# Patient Record
Sex: Female | Born: 1990 | Race: Black or African American | Hispanic: No | Marital: Married | Smoking: Never smoker
Health system: Southern US, Community
[De-identification: ages and names within clinical notes are randomized; demographics above are authoritative.]

## PROBLEM LIST (undated history)

## (undated) ENCOUNTER — Inpatient Hospital Stay (HOSPITAL_COMMUNITY): Payer: Self-pay

## (undated) DIAGNOSIS — T7840XA Allergy, unspecified, initial encounter: Secondary | ICD-10-CM

## (undated) DIAGNOSIS — A749 Chlamydial infection, unspecified: Secondary | ICD-10-CM

## (undated) DIAGNOSIS — N390 Urinary tract infection, site not specified: Secondary | ICD-10-CM

## (undated) DIAGNOSIS — Z8619 Personal history of other infectious and parasitic diseases: Secondary | ICD-10-CM

## (undated) DIAGNOSIS — R87629 Unspecified abnormal cytological findings in specimens from vagina: Secondary | ICD-10-CM

## (undated) DIAGNOSIS — O139 Gestational [pregnancy-induced] hypertension without significant proteinuria, unspecified trimester: Secondary | ICD-10-CM

## (undated) DIAGNOSIS — K219 Gastro-esophageal reflux disease without esophagitis: Secondary | ICD-10-CM

## (undated) DIAGNOSIS — J302 Other seasonal allergic rhinitis: Secondary | ICD-10-CM

## (undated) HISTORY — DX: Unspecified abnormal cytological findings in specimens from vagina: R87.629

## (undated) HISTORY — DX: Gestational (pregnancy-induced) hypertension without significant proteinuria, unspecified trimester: O13.9

## (undated) HISTORY — DX: Allergy, unspecified, initial encounter: T78.40XA

## (undated) HISTORY — DX: Personal history of other infectious and parasitic diseases: Z86.19

## (undated) HISTORY — DX: Gastro-esophageal reflux disease without esophagitis: K21.9

## (undated) HISTORY — PX: LEEP: SHX91

---

## 2011-12-25 ENCOUNTER — Emergency Department (HOSPITAL_BASED_OUTPATIENT_CLINIC_OR_DEPARTMENT_OTHER)
Admission: EM | Admit: 2011-12-25 | Discharge: 2011-12-25 | Disposition: A | Discharge status: Home / Self Care | Payer: Self-pay | Attending: Emergency Medicine | Admitting: Emergency Medicine

## 2011-12-25 ENCOUNTER — Encounter (HOSPITAL_BASED_OUTPATIENT_CLINIC_OR_DEPARTMENT_OTHER): Payer: Self-pay | Admitting: Emergency Medicine

## 2011-12-25 DIAGNOSIS — H5789 Other specified disorders of eye and adnexa: Secondary | ICD-10-CM | POA: Insufficient documentation

## 2011-12-25 DIAGNOSIS — S0500XA Injury of conjunctiva and corneal abrasion without foreign body, unspecified eye, initial encounter: Secondary | ICD-10-CM

## 2011-12-25 DIAGNOSIS — H5710 Ocular pain, unspecified eye: Secondary | ICD-10-CM | POA: Insufficient documentation

## 2011-12-25 DIAGNOSIS — R51 Headache: Secondary | ICD-10-CM | POA: Insufficient documentation

## 2011-12-25 DIAGNOSIS — H579 Unspecified disorder of eye and adnexa: Secondary | ICD-10-CM | POA: Insufficient documentation

## 2011-12-25 DIAGNOSIS — H53149 Visual discomfort, unspecified: Secondary | ICD-10-CM | POA: Insufficient documentation

## 2011-12-25 DIAGNOSIS — H02849 Edema of unspecified eye, unspecified eyelid: Secondary | ICD-10-CM | POA: Insufficient documentation

## 2011-12-25 DIAGNOSIS — H209 Unspecified iridocyclitis: Secondary | ICD-10-CM

## 2011-12-25 DIAGNOSIS — X58XXXA Exposure to other specified factors, initial encounter: Secondary | ICD-10-CM | POA: Insufficient documentation

## 2011-12-25 HISTORY — DX: Other seasonal allergic rhinitis: J30.2

## 2011-12-25 MED ORDER — CIPROFLOXACIN HCL 0.3 % OP SOLN
1.0000 [drp] | OPHTHALMIC | Status: DC
  Administered 2011-12-25: 1 [drp] via OPHTHALMIC

## 2011-12-25 MED ORDER — TETRACAINE HCL 0.5 % OP SOLN
1.0000 [drp] | Freq: Once | OPHTHALMIC | Status: DC
  Filled 2011-12-25 (×2): qty 2

## 2011-12-25 MED ORDER — CIPROFLOXACIN HCL 0.3 % OP SOLN
OPHTHALMIC | Status: AC
  Administered 2011-12-25: 1 [drp] via OPHTHALMIC
  Filled 2011-12-25: qty 2.5

## 2011-12-25 MED ORDER — HYDROCODONE-ACETAMINOPHEN 5-325 MG PO TABS
1.0000 | ORAL_TABLET | Freq: Once | ORAL | Status: AC
  Administered 2011-12-25: 1 via ORAL
  Filled 2011-12-25: qty 1

## 2011-12-25 MED ORDER — HYDROCODONE-ACETAMINOPHEN 5-325 MG PO TABS: ORAL_TABLET | ORAL | Status: AC

## 2011-12-25 MED ORDER — FLUORESCEIN SODIUM 1 MG OP STRP
1.0000 | ORAL_STRIP | Freq: Once | OPHTHALMIC | Status: DC
  Filled 2011-12-25 (×2): qty 1

## 2011-12-25 NOTE — ED Notes (Signed)
Pt c/o left eye redness, burning and drainage.  Sensitive to light.

## 2011-12-25 NOTE — Discharge Instructions (Signed)
Iritis Iritis is an inflammation of the colored part of the eye (iris). Other parts at the front of the eye may also be inflamed. The iris is part of the middle layer of the eyeball which is called the uvea or the uveal track. Any part of the uveal track can become inflamed. The other portions of the uveal track are the choroid (the thin membrane under the outer layer of the eye), and the ciliary body (joins the choroid and the iris and produces the fluid in the front of the eye).  It is extremely important to treat iritis early, as it may lead to internal eye damage causing scarring or diseases such as glaucoma. Some people have only one attack of iritis (in one or both eyes) in their lifetime, while others may get it many times. CAUSES Iritis can be associated with many different diseases, but mostly occurs in otherwise healthy people. Examples of diseases that can be associated with iritis include:  Diseases where the body's immune system attacks tissues within your own body (autoimmune diseases).   Infections (tuberculosis, gonorrhea, fungus infections, Lyme disease, infection of the lining of the heart).   Trauma or injury.   Eye diseases (acute glaucoma and others).   Inflammation from other parts of the uveal track.   Severe eye infections.   Other rare diseases.  SYMPTOMS  Eye pain or aching.   Sensitivity to light.   Loss of sight or blurred vision.   Redness of the eye. This is often accompanied by a ring of redness around the outside of the cornea, or clear covering at the front of the eye (ciliary flush).   Excessive tearing of the eye(s).   A small pupil that does not enlarge in the dark and stays smaller than the other eye's pupil.   A whitish area that obscures the lower part of the colored circular iris. Sometimes this is visible when looking at the eye, where the whitish area has a "fluid level" or flat top. This is called a "hypopyon" and is actually pus inside the  eye.  Since iritis causes the eye to become red, it is often confused with a much less dangerous form of "pink eye" or conjunctivitis. One of the most important symptoms is sensitivity to light. Anytime there is redness, discomfort in the eye(s) and extreme light sensitivity, it is extremely important to see an ophthalmologist as soon as possible. TREATMENT Acute iritis requires prompt medical evaluation by an eye specialist (ophthalmologist.) Treatment depends on the underlying cause but may include:  Corticosteroid eye drops and dilating eye drops. Follow your caregiver's exact instructions on taking and stopping corticosteroid medications (drops or pills).   Occasionally, the iritis will be so severe that it will not respond to commonly used medications. If this happens, it may be necessary to use steroid injections. The injections are given under the eye's outer surface. Sometimes oral medications are given. The decision on treatment used for iritis is usually made on an individual basis.  HOME CARE INSTRUCTIONS Your care giver will give specific instructions regarding the use of eye medications or other medications. Be certain to follow all instructions in both taking and stopping the medications. SEEK IMMEDIATE MEDICAL CARE IF:  You have redness of one or both eye.   You experience a great deal of light sensitivity.   You have pain or aching in either eye.  MAKE SURE YOU:   Understand these instructions.   Will watch your condition.  Will get help right away if you are not doing well or get worse.  Document Released: 08/08/2005 Document Revised: 07/28/2011 Document Reviewed: 01/26/2007 [facility] Patient Information 2012 [place], [place].    Narcotic and benzodiazepine use may cause drowsiness, slowed breathing or dependence.  Please use with caution and do not drive, operate machinery or watch young children alone while taking them.  Taking combinations of these medications or  drinking alcohol will potentiate these effects.

## 2011-12-25 NOTE — ED Provider Notes (Signed)
History  This chart was scribed for Gavin Pound. Oletta Lamas, MD by Bennett Scrape. This patient was seen in room MH04/MH04 and the patient's care was started at 4:46PM.  CSN: [id number]  Arrival date & time 12/25/11  1516   First MD Initiated Contact with Patient 12/25/11 1646      Chief Complaint  Patient presents with  . Eye Problem    The history is provided by the patient. No language interpreter was used.    Gail Perez is a 21 y.o. female who presents to the Emergency Department complaining of 12 hours of sudden onset, gradually worsening, constant left eye pain described as sharp and redness with associated photophobia that she noticed when she woke up this morning. The pain is worse with movement. She also c/o a HA that she states was not initially there with the onset of the eye pain but has gradually developed over the day due to the eye pain. Pt states that she wears contacts but denies sleeping in her contacts last night. She states that her vision is blurred but denies any change in her normal vision. She states that she has allergies in which she usually gets itchy, watery eyes. She states that for her allergies she takes Claritin and has drops that she uses for when she wears contacts. She denies injury or rubbing of the eye as the cause of the pain. She denies neck pain, fever, nausea and emesis as associated symptoms. She has no h/o chronic medical conditions. She denies smoking and alcohol use.   Past Medical History  Diagnosis Date  . Seasonal allergies     History reviewed. No pertinent past surgical history.  History reviewed. No pertinent family history.  History  Substance Use Topics  . Smoking status: Never Smoker   . Smokeless tobacco: Not on file  . Alcohol Use: No     Review of Systems  Constitutional: Negative for fever and chills.  HENT: Negative for neck pain.   Eyes: Positive for photophobia, pain and redness.  Gastrointestinal: Negative for nausea  and vomiting.  Neurological: Positive for headaches. Negative for seizures.  All other systems reviewed and are negative.    Allergies  Review of patient's allergies indicates no known allergies.  Home Medications   Current Outpatient Rx  Name Route Sig Dispense Refill  . LORATADINE 10 MG PO TABS Oral Take 10 mg by mouth daily.    Marland Kitchen HYDROCODONE-ACETAMINOPHEN 5-325 MG PO TABS  1-2 tablets po q 6 hours prn moderate to severe pain 15 tablet 0    Triage Vitals: BP 126/72  Pulse 60  Temp(Src) 98.5 F (36.9 C) (Oral)  Resp 20  SpO2 100%  LMP 12/02/2011  Physical Exam  Nursing note and vitals reviewed. Constitutional: She is oriented to person, place, and time. She appears well-developed and well-nourished.  HENT:  Head: Normocephalic and atraumatic.  Eyes: EOM are normal. Pupils are equal, round, and reactive to light.       Left eye conjunctivae is erythematous, left lower lid edema is noted, scratch on the left conjunctivae that does not involve the pupil is noted, right eye is normal  Neck: Normal range of motion. Neck supple.  Cardiovascular: Normal rate and regular rhythm.   Pulmonary/Chest: Effort normal and breath sounds normal.  Abdominal: Soft. There is no tenderness.  Musculoskeletal: Normal range of motion. She exhibits no edema.  Neurological: She is alert and oriented to person, place, and time.  Skin: Skin is warm and dry.  Psychiatric: She has a normal mood and affect. Her behavior is normal.    ED Course  Procedures (including critical care time)  DIAGNOSTIC STUDIES: Oxygen Saturation is 100% on room air, normal by my interpretation.    COORDINATION OF CARE: 4:55PM-Discussed need for UV lamp and numbing drops to further investigate symptoms and pt agreed. 5:00PM-Discussed antibiotic drops and pain medications as discharge plan with pt and pt agreed. Advised pt to follow up with optometrist as scheduled.    Labs Reviewed - No data to display No results  found.   1. Iritis   2. Conjunctival abrasion       MDM  I personally performed the services described in this documentation, which was scribed in my presence. The recorded information has been reviewed and considered.    Pt clinically with iritis with pain in left eye with lgith in right eye.  Flurousceine stain shows a small abrasion to left conjuntiva.  Likely cause.  Pt has appt with her own eye provider on Friday.  Cipro provided to her.    Gavin Pound. Oletta Lamas, MD 12/25/11 1610

## 2012-05-01 ENCOUNTER — Encounter (HOSPITAL_COMMUNITY): Payer: Self-pay | Admitting: *Deleted

## 2012-05-01 ENCOUNTER — Inpatient Hospital Stay (HOSPITAL_COMMUNITY): Payer: Medicaid Other

## 2012-05-01 ENCOUNTER — Inpatient Hospital Stay (HOSPITAL_COMMUNITY)
Admission: AD | Admit: 2012-05-01 | Discharge: 2012-05-01 | Disposition: A | Discharge status: Home / Self Care | Payer: Medicaid Other | Source: Ambulatory Visit | Attending: Obstetrics & Gynecology | Admitting: Obstetrics & Gynecology

## 2012-05-01 DIAGNOSIS — O2390 Unspecified genitourinary tract infection in pregnancy, unspecified trimester: Secondary | ICD-10-CM | POA: Insufficient documentation

## 2012-05-01 DIAGNOSIS — B9689 Other specified bacterial agents as the cause of diseases classified elsewhere: Secondary | ICD-10-CM | POA: Insufficient documentation

## 2012-05-01 DIAGNOSIS — R109 Unspecified abdominal pain: Secondary | ICD-10-CM | POA: Insufficient documentation

## 2012-05-01 DIAGNOSIS — A499 Bacterial infection, unspecified: Secondary | ICD-10-CM | POA: Insufficient documentation

## 2012-05-01 DIAGNOSIS — N390 Urinary tract infection, site not specified: Secondary | ICD-10-CM

## 2012-05-01 DIAGNOSIS — O2340 Unspecified infection of urinary tract in pregnancy, unspecified trimester: Secondary | ICD-10-CM

## 2012-05-01 DIAGNOSIS — N760 Acute vaginitis: Secondary | ICD-10-CM | POA: Insufficient documentation

## 2012-05-01 LAB — URINALYSIS, ROUTINE W REFLEX MICROSCOPIC
Bilirubin Urine: NEGATIVE
Glucose, UA: NEGATIVE mg/dL
Hgb urine dipstick: NEGATIVE
Protein, ur: NEGATIVE mg/dL
Urobilinogen, UA: 0.2 mg/dL (ref 0.0–1.0)

## 2012-05-01 LAB — CBC
MCH: 27.6 pg (ref 26.0–34.0)
MCV: 83.8 fL (ref 78.0–100.0)
Platelets: 282 10*3/uL (ref 150–400)
RBC: 4.64 MIL/uL (ref 3.87–5.11)
RDW: 13.8 % (ref 11.5–15.5)
WBC: 8.6 10*3/uL (ref 4.0–10.5)

## 2012-05-01 LAB — WET PREP, GENITAL: Trich, Wet Prep: NONE SEEN

## 2012-05-01 LAB — URINE MICROSCOPIC-ADD ON

## 2012-05-01 LAB — ABO/RH: ABO/RH(D): O POS

## 2012-05-01 MED ORDER — METRONIDAZOLE 500 MG PO TABS: 500.0000 mg | ORAL_TABLET | Freq: Two times a day (BID) | ORAL | Status: AC

## 2012-05-01 MED ORDER — PROMETHAZINE HCL 25 MG PO TABS: 12.5000 mg | ORAL_TABLET | Freq: Four times a day (QID) | ORAL | Status: DC | PRN

## 2012-05-01 MED ORDER — SULFAMETHOXAZOLE-TRIMETHOPRIM 800-160 MG PO TABS: 1.0000 | ORAL_TABLET | Freq: Two times a day (BID) | ORAL | Status: AC

## 2012-05-01 NOTE — MAU Note (Signed)
Pt reports lower abd pain x 2 days, positive home preg test. LMP 03/25/2012, denies bleeding. Denies dysuria

## 2012-05-01 NOTE — MAU Provider Note (Signed)
History     CSN: [id number]  Arrival date and time: 05/01/12 1718   None     Chief Complaint  Patient presents with  . Abdominal Pain   HPI Gail Perez is a 21 y.o. female @ [redacted]w[redacted]d gestation who presents to MAU with abdominal pain. The pain started 2 days ago. She describes the pain as cramping and mild. Last pap smear less than one year ago and was normal. The history was provided by the patient.  OB History    Grav Para Term Preterm Abortions TAB SAB Ect Mult Living   1               Past Medical History  Diagnosis Date  . Seasonal allergies   . No pertinent past medical history     Past Surgical History  Procedure Date  . No past surgeries     Family History  Problem Relation Age of Onset  . Other Neg Hx     History  Substance Use Topics  . Smoking status: Never Smoker   . Smokeless tobacco: Not on file  . Alcohol Use: No    Allergies: No Known Allergies  No prescriptions prior to admission    Review of Systems  Constitutional: Negative.   HENT: Negative.   Eyes: Negative.   Respiratory: Negative.   Cardiovascular: Negative.   Gastrointestinal: Positive for nausea and abdominal pain.  Genitourinary: Positive for frequency. Negative for dysuria and urgency.  Musculoskeletal: Negative for back pain.  Skin: Negative.   Neurological: Negative for dizziness and seizures.  Psychiatric/Behavioral: Negative for depression. The patient is not nervous/anxious and does not have insomnia.    Blood pressure 129/64, pulse 79, temperature 98.1 F (36.7 C), temperature source Oral, resp. rate 18, height 5\' 9"  (1.753 m), weight 155 lb (70.308 kg), last menstrual period 03/25/2012, SpO2 100.00%.  Physical Exam  Nursing note and vitals reviewed. Constitutional: She appears well-developed and well-nourished. No distress.  HENT:  Head: Normocephalic.  Neck: Neck supple.  Cardiovascular: Normal rate.   Respiratory: Effort normal.  GI: Soft. There is no  tenderness.  Genitourinary:       External genitalia without lesions. White discharge vaginal vault.  Cervix long, closed, no CMT, no adnexal tenderness or mass palpated. Uterus with minimal enlargement.  Skin: Skin is warm and dry.  Psychiatric: She has a normal mood and affect. Her behavior is normal. Judgment and thought content normal.   Results for orders placed during the hospital encounter of 05/01/12 (from the past 24 hour(s))  URINALYSIS, ROUTINE W REFLEX MICROSCOPIC     Status: Abnormal   Collection Time   05/01/12  7:00 PM      Component Value Range   Color, Urine YELLOW  YELLOW   APPearance CLEAR  CLEAR   Specific Gravity, Urine 1.025  1.005 - 1.030   pH 6.0  5.0 - 8.0   Glucose, UA NEGATIVE  NEGATIVE mg/dL   Hgb urine dipstick NEGATIVE  NEGATIVE   Bilirubin Urine NEGATIVE  NEGATIVE   Ketones, ur NEGATIVE  NEGATIVE mg/dL   Protein, ur NEGATIVE  NEGATIVE mg/dL   Urobilinogen, UA 0.2  0.0 - 1.0 mg/dL   Nitrite NEGATIVE  NEGATIVE   Leukocytes, UA SMALL (*) NEGATIVE  URINE MICROSCOPIC-ADD ON     Status: Abnormal   Collection Time   05/01/12  7:00 PM      Component Value Range   Squamous Epithelial / LPF FEW (*) RARE   WBC, UA 11-20  <  3 WBC/hpf   RBC / HPF 3-6  <3 RBC/hpf   Bacteria, UA MANY (*) RARE   Urine-Other MUCOUS PRESENT    POCT PREGNANCY, URINE     Status: Abnormal   Collection Time   05/01/12  7:06 PM      Component Value Range   Preg Test, Ur POSITIVE (*) NEGATIVE  CBC     Status: Normal   Collection Time   05/01/12  7:17 PM      Component Value Range   WBC 8.6  4.0 - 10.5 K/uL   RBC 4.64  3.87 - 5.11 MIL/uL   Hemoglobin 12.8  12.0 - 15.0 g/dL   HCT 16.1  09.6 - 04.5 %   MCV 83.8  78.0 - 100.0 fL   MCH 27.6  26.0 - 34.0 pg   MCHC 32.9  30.0 - 36.0 g/dL   RDW 40.9  81.1 - 91.4 %   Platelets 282  150 - 400 K/uL  ABO/RH     Status: Normal   Collection Time   05/01/12  7:17 PM      Component Value Range   ABO/RH(D) O POS    HCG, QUANTITATIVE, PREGNANCY      Status: Abnormal   Collection Time   05/01/12  7:17 PM      Component Value Range   hCG, Beta Chain, Quant, S 22595 (*) <5 mIU/mL  WET PREP, GENITAL     Status: Abnormal   Collection Time   05/01/12  9:24 PM      Component Value Range   Yeast Wet Prep HPF POC NONE SEEN  NONE SEEN   Trich, Wet Prep NONE SEEN  NONE SEEN   Clue Cells Wet Prep HPF POC MODERATE (*) NONE SEEN   WBC, Wet Prep HPF POC FEW (*) NONE SEEN   MAU Course  Procedures US Ob Comp Less 14 Wks  05/01/2012  *RADIOLOGY REPORT*  Clinical Data: Pelvic pain.  5 weeks and 2 days pregnant by last menstrual period.  OBSTETRIC <14 WK [place] AND TRANSVAGINAL OB US  Technique:  Both transabdominal and transvaginal ultrasound examinations were performed for complete evaluation of the gestation as well as the maternal uterus, adnexal regions, and pelvic cul-de-sac.  Transvaginal technique was performed to assess early pregnancy.  Comparison:  None.  Intrauterine gestational sac:  Visualized/normal in shape. Yolk sac: Visualized/normal in shape. Embryo: Visualized. Cardiac Activity: Visualized. Heart Rate: 92 bpm  CRL: 2.9  mm  6 w  0 d        [place] EDC: 12/25/2012.  Maternal uterus/adnexae: 4.7 cm simple cyst in the right ovary.  Normal appearing left ovary.  No subchorionic hemorrhage.  Small amount of free peritoneal fluid, within normal limits of physiological fluid.  IMPRESSION: Single live intrauterine gestation with an estimated gestational age of [redacted] weeks and 0 days.  No complicating features.   Original Report Authenticated By: Darrol Angel, M.D.    US Ob Transvaginal  05/01/2012  *RADIOLOGY REPORT*  Clinical Data: Pelvic pain.  5 weeks and 2 days pregnant by last menstrual period.  OBSTETRIC <14 WK [place] AND TRANSVAGINAL OB US  Technique:  Both transabdominal and transvaginal ultrasound examinations were performed for complete evaluation of the gestation as well as the maternal uterus, adnexal regions, and pelvic cul-de-sac.  Transvaginal  technique was performed to assess early pregnancy.  Comparison:  None.  Intrauterine gestational sac:  Visualized/normal in shape. Yolk sac: Visualized/normal in shape. Embryo: Visualized. Cardiac Activity: Visualized.  Heart Rate: 92 bpm  CRL: 2.9  mm  6 w  0 d        [place] EDC: 12/25/2012.  Maternal uterus/adnexae: 4.7 cm simple cyst in the right ovary.  Normal appearing left ovary.  No subchorionic hemorrhage.  Small amount of free peritoneal fluid, within normal limits of physiological fluid.  IMPRESSION: Single live intrauterine gestation with an estimated gestational age of [redacted] weeks and 0 days.  No complicating features.   Original Report Authenticated By: Darrol Angel, M.D.    Assessment: 21 y.o. female @ [redacted]w[redacted]d with UTI    Bacterial vaginosis  Plan:  Septra DS Rx   Phenergan Rx   Flagyl   Start prenatal care and prenatal vitamins, return as needed.  Medication List  As of 05/01/2012 10:00 PM   START taking these medications         metroNIDAZOLE 500 MG tablet   Commonly known as: FLAGYL   Take 1 tablet (500 mg total) by mouth 2 (two) times daily.      promethazine 25 MG tablet   Commonly known as: PHENERGAN   Take 0.5 tablets (12.5 mg total) by mouth every 6 (six) hours as needed for nausea.      sulfamethoxazole-trimethoprim 800-160 MG per tablet   Commonly known as: BACTRIM DS,SEPTRA DS   Take 1 tablet by mouth 2 (two) times daily.          Where to get your medications    These are the prescriptions that you need to pick up.   You may get these medications from any pharmacy.         metroNIDAZOLE 500 MG tablet   promethazine 25 MG tablet   sulfamethoxazole-trimethoprim 800-160 MG per tablet           Follow-up Information    Follow up with [facility] [place].   Contact information:   [address] [facility] [address]        I have reviewed this patient's vital signs, nurses notes, appropriate labs and imaging. I have answered all the  patient's questions. Patient voices understanding.  Birdie Beveridge, RN, FNP, [facility] 05/01/2012, 9:54 PM

## 2012-05-02 LAB — GC/CHLAMYDIA PROBE AMP, GENITAL
Chlamydia, DNA Probe: NEGATIVE
GC Probe Amp, Genital: NEGATIVE

## 2012-07-11 ENCOUNTER — Encounter (HOSPITAL_BASED_OUTPATIENT_CLINIC_OR_DEPARTMENT_OTHER): Payer: Self-pay | Admitting: *Deleted

## 2012-07-11 ENCOUNTER — Emergency Department (HOSPITAL_BASED_OUTPATIENT_CLINIC_OR_DEPARTMENT_OTHER)
Admission: EM | Admit: 2012-07-11 | Discharge: 2012-07-11 | Disposition: A | Discharge status: Home / Self Care | Payer: Medicaid Other | Attending: Emergency Medicine | Admitting: Emergency Medicine

## 2012-07-11 DIAGNOSIS — J029 Acute pharyngitis, unspecified: Secondary | ICD-10-CM | POA: Insufficient documentation

## 2012-07-11 DIAGNOSIS — J3489 Other specified disorders of nose and nasal sinuses: Secondary | ICD-10-CM | POA: Insufficient documentation

## 2012-07-11 DIAGNOSIS — H6120 Impacted cerumen, unspecified ear: Secondary | ICD-10-CM | POA: Insufficient documentation

## 2012-07-11 DIAGNOSIS — H6090 Unspecified otitis externa, unspecified ear: Secondary | ICD-10-CM

## 2012-07-11 DIAGNOSIS — H60399 Other infective otitis externa, unspecified ear: Secondary | ICD-10-CM | POA: Insufficient documentation

## 2012-07-11 MED ORDER — DOCUSATE SODIUM 100 MG PO CAPS
100.0000 mg | ORAL_CAPSULE | Freq: Once | ORAL | Status: AC
  Administered 2012-07-11: 100 mg via ORAL
  Filled 2012-07-11: qty 2

## 2012-07-11 MED ORDER — NEOMYCIN-POLYMYXIN-HC 3.5-10000-1 OT SUSP: 4.0000 [drp] | Freq: Three times a day (TID) | OTIC | Status: DC

## 2012-07-11 NOTE — ED Notes (Signed)
One week of ear pain sore throat and cough

## 2012-07-11 NOTE — ED Provider Notes (Signed)
History     CSN: [id number]  Arrival date & time 07/11/12  1548   First MD Initiated Contact with Patient 07/11/12 1550      Chief Complaint  Patient presents with  . Otalgia  . Sore Throat    (Consider location/radiation/quality/duration/timing/severity/associated sxs/prior treatment) HPI Pt reports about a week of moderate to severe aching L ear pain, decreased hearing. Associated with nasal congestion and sore throat. She has been using peroxide without much improvement. She is approx [redacted]wks pregnant.   Past Medical History  Diagnosis Date  . Seasonal allergies   . No pertinent past medical history     Past Surgical History  Procedure Date  . No past surgeries     Family History  Problem Relation Age of Onset  . Other Neg Hx     History  Substance Use Topics  . Smoking status: Never Smoker   . Smokeless tobacco: Not on file  . Alcohol Use: No    OB History    Grav Para Term Preterm Abortions TAB SAB Ect Mult Living   1               Review of Systems All other systems reviewed and are negative except as noted in HPI.   Allergies  Review of patient's allergies indicates no known allergies.  Home Medications   Current Outpatient Rx  Name  Route  Sig  Dispense  Refill  . ONE-DAILY MULTI VITAMINS PO TABS   Oral   Take 1 tablet by mouth daily.         Marland Kitchen PROMETHAZINE HCL 25 MG PO TABS   Oral   Take 0.5 tablets (12.5 mg total) by mouth every 6 (six) hours as needed for nausea.   20 tablet   0     BP 123/74  Pulse 79  Temp 98 F (36.7 C) (Oral)  Resp 20  SpO2 100%  LMP 03/25/2012  Physical Exam  Nursing note and vitals reviewed. Constitutional: She is oriented to person, place, and time. She appears well-developed and well-nourished.  HENT:  Head: Normocephalic and atraumatic.  Mouth/Throat: No oropharyngeal exudate.       R canal with cerumen obscuring TM; L canal is swollen with cerumen and peroxide obscuring TM, tender to palpation  of the L pre-auricula area  Eyes: EOM are normal. Pupils are equal, round, and reactive to light.  Neck: Normal range of motion. Neck supple.  Cardiovascular: Normal rate, normal heart sounds and intact distal pulses.   Pulmonary/Chest: Effort normal and breath sounds normal.  Abdominal: Bowel sounds are normal. She exhibits no distension. There is no tenderness.  Musculoskeletal: Normal range of motion. She exhibits no edema and no tenderness.  Lymphadenopathy:    She has no cervical adenopathy.  Neurological: She is alert and oriented to person, place, and time. She has normal strength. No cranial nerve deficit or sensory deficit.  Skin: Skin is warm and dry. No rash noted.  Psychiatric: She has a normal mood and affect.    ED Course  Procedures (including critical care time)  Labs Reviewed - No data to display No results found.   No diagnosis found.    MDM  Large amount of cerumen removed by nurse with saline irrigation. There is some maceration and swelling of the canal and the TM is still obscured by residual saline. Will start Abx drops, advised OTC cerumen removal for R ear and ENT followup.  Ahnna Dungan B. Bernette Mayers, MD 07/11/12 1740

## 2012-07-11 NOTE — ED Notes (Signed)
Ear pain x one week.

## 2012-07-16 ENCOUNTER — Other Ambulatory Visit (HOSPITAL_COMMUNITY): Payer: Self-pay | Admitting: Nurse Practitioner

## 2012-07-16 DIAGNOSIS — Z3689 Encounter for other specified antenatal screening: Secondary | ICD-10-CM

## 2012-07-16 LAB — OB RESULTS CONSOLE RUBELLA ANTIBODY, IGM: Rubella: IMMUNE

## 2012-07-16 LAB — OB RESULTS CONSOLE GC/CHLAMYDIA: Chlamydia: POSITIVE

## 2012-07-16 LAB — OB RESULTS CONSOLE HIV ANTIBODY (ROUTINE TESTING): HIV: NONREACTIVE

## 2012-07-16 LAB — OB RESULTS CONSOLE ANTIBODY SCREEN: Antibody Screen: NEGATIVE

## 2012-07-16 LAB — OB RESULTS CONSOLE HEPATITIS B SURFACE ANTIGEN: Hepatitis B Surface Ag: NEGATIVE

## 2012-07-30 ENCOUNTER — Ambulatory Visit (HOSPITAL_COMMUNITY)
Admission: RE | Admit: 2012-07-30 | Discharge: 2012-07-30 | Disposition: A | Discharge status: Home / Self Care | Payer: Medicaid Other | Source: Ambulatory Visit | Attending: Nurse Practitioner | Admitting: Nurse Practitioner

## 2012-07-30 DIAGNOSIS — Z363 Encounter for antenatal screening for malformations: Secondary | ICD-10-CM | POA: Insufficient documentation

## 2012-07-30 DIAGNOSIS — Z1389 Encounter for screening for other disorder: Secondary | ICD-10-CM | POA: Insufficient documentation

## 2012-07-30 DIAGNOSIS — O358XX0 Maternal care for other (suspected) fetal abnormality and damage, not applicable or unspecified: Secondary | ICD-10-CM | POA: Insufficient documentation

## 2012-07-30 DIAGNOSIS — Z3689 Encounter for other specified antenatal screening: Secondary | ICD-10-CM

## 2012-08-08 LAB — OB RESULTS CONSOLE GC/CHLAMYDIA: Chlamydia: NEGATIVE

## 2012-08-22 DIAGNOSIS — O139 Gestational [pregnancy-induced] hypertension without significant proteinuria, unspecified trimester: Secondary | ICD-10-CM

## 2012-08-22 HISTORY — DX: Gestational (pregnancy-induced) hypertension without significant proteinuria, unspecified trimester: O13.9

## 2012-08-22 NOTE — L&D Delivery Note (Signed)
Delivery Note At 1:28 AM a viable female was delivered via Vaginal, Spontaneous Delivery (Presentation: Right Occiput Anterior), delivered through loose nuchal and body cord.  APGAR: 8, 9; weight: not available at time of note  .   Placenta status: Intact, Spontaneous.  Cord:  3vc with the following complications: Loose True Knot.  Cord pH: not done  Anesthesia: Epidural  Episiotomy: None Lacerations: 1st degree Lt Sulcus- repaired for hemostasis, hemostatic suburethral not repaired Suture Repair: 3.0 monocryl Est. Blood Loss (mL):   Mom to postpartum.  Baby to nursery-stable. Breastfeeding, micronor for contraception, OP circumcision  Marge Duncans 12/30/2012, 2:15 AM

## 2012-08-22 NOTE — L&D Delivery Note (Signed)
Agree with above note.  Nazario Russom H. 01/07/2013 4:16 PM

## 2012-09-11 ENCOUNTER — Inpatient Hospital Stay (HOSPITAL_COMMUNITY)
Admission: AD | Admit: 2012-09-11 | Discharge: 2012-09-11 | Disposition: A | Discharge status: Home / Self Care | Payer: Medicaid Other | Source: Ambulatory Visit | Attending: Obstetrics and Gynecology | Admitting: Obstetrics and Gynecology

## 2012-09-11 ENCOUNTER — Encounter (HOSPITAL_COMMUNITY): Payer: Self-pay | Admitting: *Deleted

## 2012-09-11 DIAGNOSIS — O2390 Unspecified genitourinary tract infection in pregnancy, unspecified trimester: Secondary | ICD-10-CM | POA: Insufficient documentation

## 2012-09-11 DIAGNOSIS — R109 Unspecified abdominal pain: Secondary | ICD-10-CM | POA: Insufficient documentation

## 2012-09-11 DIAGNOSIS — N390 Urinary tract infection, site not specified: Secondary | ICD-10-CM | POA: Insufficient documentation

## 2012-09-11 DIAGNOSIS — N949 Unspecified condition associated with female genital organs and menstrual cycle: Secondary | ICD-10-CM

## 2012-09-11 HISTORY — DX: Urinary tract infection, site not specified: N39.0

## 2012-09-11 LAB — URINE MICROSCOPIC-ADD ON

## 2012-09-11 LAB — URINALYSIS, ROUTINE W REFLEX MICROSCOPIC
Glucose, UA: NEGATIVE mg/dL
Nitrite: NEGATIVE
Specific Gravity, Urine: 1.02 (ref 1.005–1.030)
pH: 6 (ref 5.0–8.0)

## 2012-09-11 LAB — WET PREP, GENITAL: Clue Cells Wet Prep HPF POC: NONE SEEN

## 2012-09-11 MED ORDER — CEPHALEXIN 500 MG PO CAPS: 500.0000 mg | ORAL_CAPSULE | Freq: Three times a day (TID) | ORAL | Status: DC

## 2012-09-11 NOTE — MAU Note (Signed)
Patient states she started having constant lower right abdominal pain that is worse with movement. Denies bleeding or leaking and reports good fetal movement.

## 2012-09-11 NOTE — MAU Provider Note (Signed)
Chief Complaint:  Abdominal Pain   First Provider Initiated Contact with Patient 09/11/12 1818     HPI: Gail Perez is a 22 y.o. G1P0 at [redacted]w[redacted]d who presents to maternity admissions reporting new onset abdominal pressure and small amount of pain.  This began this morning and has remained constant since then described as dull achy generalized abdominal pain.  She has had this before during this pregnancy but the pain usually dissipates within 5-10 minutes.  Pt states the pain has gotten worse during sitting up or standing on her feet or trying to bend over and stand up.  Denies fever, chills, nausea, vomiting, dysuria, hematuria, constipation, diarrhea.   Denies contractions, leakage of fluid or vaginal bleeding. Good fetal movement.   Pregnancy Course: No complications  Past Medical History: Past Medical History  Diagnosis Date  . Seasonal allergies   . Urinary tract infection     Past obstetric history: OB History    Grav Para Term Preterm Abortions TAB SAB Ect Mult Living   1              # Outc Date GA Lbr Len/2nd Wgt Sex Del Anes PTL Lv   1 CUR               Past Surgical History: Past Surgical History  Procedure Date  . No past surgeries     Family History: Family History  Problem Relation Age of Onset  . Other Neg Hx   . Hypertension Father   . Hyperlipidemia Father   . Cancer Maternal Grandfather     lung    Social History: History  Substance Use Topics  . Smoking status: Never Smoker   . Smokeless tobacco: Never Used  . Alcohol Use: No    Allergies: No Known Allergies  Meds:  Prescriptions prior to admission  Medication Sig Dispense Refill  . Prenatal Vit-Fe Fumarate-FA (PRENATAL MULTIVITAMIN) TABS Take 1 tablet by mouth every evening.        ROS: Pertinent findings in history of present illness.  Physical Exam  Blood pressure 125/66, pulse 74, temperature 98 F (36.7 C), temperature source Oral, resp. rate 16, height 5\' 7"  (1.702 m), weight  79.742 kg (175 lb 12.8 oz), last menstrual period 03/25/2012, SpO2 100.00%. GENERAL: Well-developed, well-nourished female in no acute distress.  HEENT: normocephalic HEART: RRR RESP: CTA ABDOMEN: Soft, TTP generalized abdomen, gravid appropriate for gestational age EXTREMITIES: Nontender, no edema NEURO: alert and oriented SPECULUM EXAM: NEFG, physiologic discharge, no blood, cervix clean  Dilation: Closed Effacement (%): Thick Exam by:: Dr Paulina Fusi  FHT:  Baseline 140 , moderate variability, accelerations absent, no decelerations Contractions: None    Labs: Results for orders placed during the hospital encounter of 09/11/12 (from the past 24 hour(s))  URINALYSIS, ROUTINE W REFLEX MICROSCOPIC     Status: Abnormal   Collection Time   09/11/12  5:00 PM      Component Value Range   Color, Urine YELLOW  YELLOW   APPearance CLEAR  CLEAR   Specific Gravity, Urine 1.020  1.005 - 1.030   pH 6.0  5.0 - 8.0   Glucose, UA NEGATIVE  NEGATIVE mg/dL   Hgb urine dipstick NEGATIVE  NEGATIVE   Bilirubin Urine NEGATIVE  NEGATIVE   Ketones, ur NEGATIVE  NEGATIVE mg/dL   Protein, ur NEGATIVE  NEGATIVE mg/dL   Urobilinogen, UA 0.2  0.0 - 1.0 mg/dL   Nitrite NEGATIVE  NEGATIVE   Leukocytes, UA MODERATE (*) NEGATIVE  URINE  MICROSCOPIC-ADD ON     Status: Abnormal   Collection Time   09/11/12  5:00 PM      Component Value Range   Squamous Epithelial / LPF MANY (*) RARE   WBC, UA 7-10  <3 WBC/hpf   RBC / HPF 0-2  <3 RBC/hpf   Bacteria, UA MANY (*) RARE   Urine-Other MUCOUS PRESENT      MAU course: UA shows UTI, will send for UCx.  Also got Wet Prep, GC/Chlamydia   Wet Prep + for WBC, most likely physiologic   Assessment: 1. UTI (lower urinary tract infection)      Plan: Discharge home Labor precautions and fetal kick counts Most likely combination between UTI and round ligament pain.  Will Rx Keflex 500 mg TID for 7 days and recommend Tylenol 650 mg for pain q 6 hrs.       Medication  List     As of 09/11/2012  7:11 PM    ASK your doctor about these medications         prenatal multivitamin Tabs   Take 1 tablet by mouth every evening.         Briscoe Deutscher, DO 09/11/2012 6:12 PM  I was present for the exam and agree with the above.  [place], [place] 09/11/2012 7:35 PM

## 2012-09-11 NOTE — MAU Note (Signed)
abd pain, initially comes and goes.  Now more constant, esp when up and walking.  Goes away when sits down.

## 2012-09-12 LAB — GC/CHLAMYDIA PROBE AMP
CT Probe RNA: NEGATIVE
GC Probe RNA: NEGATIVE

## 2012-09-12 LAB — URINE CULTURE: Colony Count: 40000

## 2012-09-12 NOTE — MAU Provider Note (Signed)
Attestation of Attending Supervision of Advanced Practitioner (CNM/NP): Evaluation and management procedures were performed by the Advanced Practitioner under my supervision and collaboration.  I have reviewed the Advanced Practitioner's note and chart, and I agree with the management and plan.  [name] 09/12/2012 9:39 AM   

## 2012-12-17 ENCOUNTER — Telehealth (HOSPITAL_COMMUNITY): Payer: Self-pay | Admitting: *Deleted

## 2012-12-17 ENCOUNTER — Encounter (HOSPITAL_COMMUNITY): Payer: Self-pay | Admitting: *Deleted

## 2012-12-17 NOTE — Telephone Encounter (Signed)
Preadmission screen  

## 2012-12-26 ENCOUNTER — Encounter (HOSPITAL_COMMUNITY): Payer: Self-pay | Admitting: *Deleted

## 2012-12-26 ENCOUNTER — Inpatient Hospital Stay (HOSPITAL_COMMUNITY)
Admission: AD | Admit: 2012-12-26 | Discharge: 2012-12-26 | Disposition: A | Discharge status: Home / Self Care | Payer: Medicaid Other | Source: Ambulatory Visit | Attending: Obstetrics & Gynecology | Admitting: Obstetrics & Gynecology

## 2012-12-26 DIAGNOSIS — O139 Gestational [pregnancy-induced] hypertension without significant proteinuria, unspecified trimester: Secondary | ICD-10-CM | POA: Insufficient documentation

## 2012-12-26 DIAGNOSIS — O169 Unspecified maternal hypertension, unspecified trimester: Secondary | ICD-10-CM

## 2012-12-26 DIAGNOSIS — O2390 Unspecified genitourinary tract infection in pregnancy, unspecified trimester: Secondary | ICD-10-CM | POA: Insufficient documentation

## 2012-12-26 DIAGNOSIS — O2343 Unspecified infection of urinary tract in pregnancy, third trimester: Secondary | ICD-10-CM

## 2012-12-26 DIAGNOSIS — N390 Urinary tract infection, site not specified: Secondary | ICD-10-CM | POA: Insufficient documentation

## 2012-12-26 LAB — URINE MICROSCOPIC-ADD ON

## 2012-12-26 LAB — PROTEIN / CREATININE RATIO, URINE
Creatinine, Urine: 61.57 mg/dL
Protein Creatinine Ratio: 0.12 (ref 0.00–0.15)

## 2012-12-26 LAB — CBC
HCT: 34.5 % — ABNORMAL LOW (ref 36.0–46.0)
Hemoglobin: 11.9 g/dL — ABNORMAL LOW (ref 12.0–15.0)
MCH: 29.3 pg (ref 26.0–34.0)
MCHC: 34.5 g/dL (ref 30.0–36.0)
MCV: 85 fL (ref 78.0–100.0)
RBC: 4.06 MIL/uL (ref 3.87–5.11)

## 2012-12-26 LAB — COMPREHENSIVE METABOLIC PANEL
ALT: 9 U/L (ref 0–35)
BUN: 7 mg/dL (ref 6–23)
CO2: 25 mEq/L (ref 19–32)
Calcium: 9.6 mg/dL (ref 8.4–10.5)
GFR calc Af Amer: >90 mL/min (ref >90)
GFR calc non Af Amer: >90 mL/min (ref >90)
Glucose, Bld: 72 mg/dL (ref 70–99)
Sodium: 136 mEq/L (ref 135–145)
Total Protein: 6.7 g/dL (ref 6.0–8.3)

## 2012-12-26 LAB — URINALYSIS, ROUTINE W REFLEX MICROSCOPIC
Bilirubin Urine: NEGATIVE
Glucose, UA: NEGATIVE mg/dL
Ketones, ur: NEGATIVE mg/dL
Specific Gravity, Urine: 1.015 (ref 1.005–1.030)
pH: 7 (ref 5.0–8.0)

## 2012-12-26 MED ORDER — NITROFURANTOIN MONOHYD MACRO 100 MG PO CAPS: 100.0000 mg | ORAL_CAPSULE | Freq: Two times a day (BID) | ORAL | Status: DC

## 2012-12-26 NOTE — MAU Note (Signed)
Received call from lab regarding pt's protein creatinine ratio. Ration reported to  Dr. Pollie Meyer.

## 2012-12-26 NOTE — Progress Notes (Signed)
External os 1.5; internal closed

## 2012-12-26 NOTE — MAU Provider Note (Signed)
  History     CSN: [id number]  Arrival date and time: 12/26/12 1046   First Provider Initiated Contact with Patient 12/26/12 1147      Chief Complaint  Patient presents with  . Hypertension   HPI  Pt is a G1P0 at 39.3 wks IUP sent from [facility] Department with report of elevated blood pressure.  +report of right-sided headache.  No vision changes or epigastric pain.  No reports of additional problems in pregnancy.    Past Medical History  Diagnosis Date  . Seasonal allergies   . Urinary tract infection   . Hx of varicella     Past Surgical History  Procedure Laterality Date  . No past surgeries      Family History  Problem Relation Age of Onset  . Other Neg Hx   . Hypertension Father   . Hyperlipidemia Father   . Heart disease Father     enlatged heart  . Sarcoidosis Father   . Cancer Maternal Grandfather     lung  . Anemia Mother   . Hypertension Sister     History  Substance Use Topics  . Smoking status: Never Smoker   . Smokeless tobacco: Never Used  . Alcohol Use: No    Allergies: No Known Allergies  No prescriptions prior to admission    Review of Systems  Constitutional: Negative for fever and chills.  Eyes: Negative.   Gastrointestinal: Negative.   Genitourinary: Negative.   Neurological: Positive for headaches.   Physical Exam   Blood pressure 137/88, pulse 78, temperature 97 F (36.1 C), temperature source Oral, resp. rate 18, height 5\' 7"  (1.702 m), weight 95.074 kg (209 lb 9.6 oz), last menstrual period 03/25/2012, SpO2 100.00%.  Physical Exam  Constitutional: She is oriented to person, place, and time. She appears well-developed and well-nourished. No distress.  HENT:  Head: Normocephalic.  Eyes: Pupils are equal, round, and reactive to light.  Neck: Normal range of motion. Neck supple.  Cardiovascular: Normal rate and regular rhythm.   Respiratory: Effort normal and breath sounds normal.  Genitourinary: No bleeding around the vagina.  Vaginal discharge (mucusy) found.  Musculoskeletal: Normal range of motion. She exhibits edema (2+ bilat).  Neurological: She is alert and oriented to person, place, and time. She has normal reflexes. She displays normal reflexes.  Skin: Skin is warm and dry.   Dilation: 1.5 (external os); internal os closed Effacement (%): 60 Cervical Position: Posterior Station: Ballotable Exam by:: Roney Marion, CNM  FHR 130's, +accels reactive Toco - irregular  MAU Course  Procedures  1400 Pt reassessed; denies headache, epigastric pain, or vision changes; consulted with Dr. Debroah Loop > reviewed blood pressures/exam > discharge home with follow-up in office on Friday.  Assessment and Plan   G1P0 at 39.3 wks IUP Category I Gestational Hypertension - Normal Labs UTI  Plan: DC to home RX Macrobid PreX precautions Follow-up in GYN clinic on Friday 0800   [facility] 12/26/2012, 11:49 AM

## 2012-12-26 NOTE — MAU Note (Signed)
[facility]t and was sent over for elevated blood pressures.  VE 1-2 cm.  Denies vaginal bleeding.  Good fetal movement.

## 2012-12-27 LAB — URINE CULTURE: Colony Count: 50000

## 2012-12-28 ENCOUNTER — Ambulatory Visit (INDEPENDENT_AMBULATORY_CARE_PROVIDER_SITE_OTHER): Payer: Medicaid Other | Admitting: *Deleted

## 2012-12-28 ENCOUNTER — Encounter (HOSPITAL_COMMUNITY): Payer: Self-pay | Admitting: Family

## 2012-12-28 ENCOUNTER — Inpatient Hospital Stay (HOSPITAL_COMMUNITY)
Admission: AD | Admit: 2012-12-28 | Discharge: 2012-12-31 | DRG: 775 | Disposition: A | Discharge status: Home / Self Care | Payer: Medicaid Other | Source: Ambulatory Visit | Attending: Obstetrics & Gynecology | Admitting: Obstetrics & Gynecology

## 2012-12-28 VITALS — BP 134/82 | HR 78 | Wt 208.5 lb

## 2012-12-28 DIAGNOSIS — O133 Gestational [pregnancy-induced] hypertension without significant proteinuria, third trimester: Secondary | ICD-10-CM

## 2012-12-28 DIAGNOSIS — O139 Gestational [pregnancy-induced] hypertension without significant proteinuria, unspecified trimester: Secondary | ICD-10-CM

## 2012-12-28 HISTORY — DX: Chlamydial infection, unspecified: A74.9

## 2012-12-28 LAB — CBC
Hemoglobin: 11.6 g/dL — ABNORMAL LOW (ref 12.0–15.0)
MCH: 29.3 pg (ref 26.0–34.0)
MCV: 84.6 fL (ref 78.0–100.0)
Platelets: 220 10*3/uL (ref 150–400)
Platelets: 235 10*3/uL (ref 150–400)
RBC: 4.1 MIL/uL (ref 3.87–5.11)
RBC: 3.95 MIL/uL (ref 3.87–5.11)
RDW: 13.9 % (ref 11.5–15.5)
WBC: 11.8 10*3/uL — ABNORMAL HIGH (ref 4.0–10.5)
WBC: 11.6 10*3/uL — ABNORMAL HIGH (ref 4.0–10.5)

## 2012-12-28 LAB — COMPREHENSIVE METABOLIC PANEL
ALT: 10 U/L (ref 0–35)
AST: 16 U/L (ref 0–37)
Albumin: 2.8 g/dL — ABNORMAL LOW (ref 3.5–5.2)
Calcium: 9.4 mg/dL (ref 8.4–10.5)
Creatinine, Ser: 0.74 mg/dL (ref 0.50–1.10)
Sodium: 136 mEq/L (ref 135–145)

## 2012-12-28 LAB — PROTEIN / CREATININE RATIO, URINE
Protein Creatinine Ratio: 0.07 (ref 0.00–0.15)
Total Protein, Urine: 10 mg/dL

## 2012-12-28 LAB — TYPE AND SCREEN
ABO/RH(D): O POS
Antibody Screen: NEGATIVE

## 2012-12-28 MED ORDER — OXYCODONE-ACETAMINOPHEN 5-325 MG PO TABS
1.0000 | ORAL_TABLET | ORAL | Status: DC | PRN
  Administered 2012-12-30: 1 via ORAL
  Filled 2012-12-28: qty 1

## 2012-12-28 MED ORDER — OXYTOCIN 40 UNITS IN LACTATED RINGERS INFUSION - SIMPLE MED
62.5000 mL/h | INTRAVENOUS | Status: DC
  Administered 2012-12-30: 62.5 mL/h via INTRAVENOUS
  Filled 2012-12-28: qty 1000

## 2012-12-28 MED ORDER — ACETAMINOPHEN 325 MG PO TABS: 650.0000 mg | ORAL_TABLET | ORAL | Status: DC | PRN

## 2012-12-28 MED ORDER — NALBUPHINE SYRINGE 5 MG/0.5 ML
10.0000 mg | INJECTION | INTRAMUSCULAR | Status: DC | PRN
  Filled 2012-12-28: qty 1

## 2012-12-28 MED ORDER — TERBUTALINE SULFATE 1 MG/ML IJ SOLN: 0.2500 mg | Freq: Once | INTRAMUSCULAR | Status: AC | PRN

## 2012-12-28 MED ORDER — LIDOCAINE HCL (PF) 1 % IJ SOLN
30.0000 mL | INTRAMUSCULAR | Status: DC | PRN
  Filled 2012-12-28 (×2): qty 30

## 2012-12-28 MED ORDER — CITRIC ACID-SODIUM CITRATE 334-500 MG/5ML PO SOLN: 30.0000 mL | ORAL | Status: DC | PRN

## 2012-12-28 MED ORDER — IBUPROFEN 600 MG PO TABS
600.0000 mg | ORAL_TABLET | Freq: Four times a day (QID) | ORAL | Status: DC | PRN
  Administered 2012-12-30: 600 mg via ORAL
  Filled 2012-12-28: qty 1

## 2012-12-28 MED ORDER — ONDANSETRON HCL 4 MG/2ML IJ SOLN: 4.0000 mg | Freq: Four times a day (QID) | INTRAMUSCULAR | Status: DC | PRN

## 2012-12-28 MED ORDER — MISOPROSTOL 25 MCG QUARTER TABLET
25.0000 ug | ORAL_TABLET | ORAL | Status: DC | PRN
  Administered 2012-12-28 – 2012-12-29 (×3): 25 ug via VAGINAL
  Filled 2012-12-28 (×2): qty 0.25
  Filled 2012-12-28: qty 1
  Filled 2012-12-28: qty 0.25

## 2012-12-28 MED ORDER — OXYTOCIN BOLUS FROM INFUSION: 500.0000 mL | INTRAVENOUS | Status: DC

## 2012-12-28 MED ORDER — LACTATED RINGERS IV SOLN
INTRAVENOUS | Status: DC
  Administered 2012-12-28 – 2012-12-29 (×4): via INTRAVENOUS

## 2012-12-28 MED ORDER — LACTATED RINGERS IV SOLN: 500.0000 mL | INTRAVENOUS | Status: DC | PRN

## 2012-12-28 NOTE — MAU Note (Signed)
Patient presents to MAU direct from clinic d/t elevated BPs at visit this morning. Denies neurological s/s or RUQ pain. Reports + FM. Denies vaginal bleeding, LOF or contractions.

## 2012-12-28 NOTE — Progress Notes (Signed)
Here for bp recheck, seen in MAU 12/26/12 for Gestational Hypertension. Denies headache or visual changes today. Bp's reported to Dr. Macon Large and review of 24 hour urine results. Per Dr. Macon Large patient to be sent to MAU for evaluation and possible induction of labor. Report called to MAU charge nurse and patient taken to MAU with family.

## 2012-12-28 NOTE — H&P (Signed)
Gail Perez is a 22 y.o. female presenting for gestational hypertension. [facility] for elevated BP. Has had pressures in 140s/90s at HD, once in MAU on 4/16 and again today in clinic and in MAU. BP today was 140/77 and 142/80.  She denies headache, vision changes or RUQ pain. She does have lower extremity swelling.  She has had irregular contractions, no bleeding or loss of fluid. Baby moving normally. Quad screen negative, normal ultrasounds, 1 hour GTT 83.  Dating by early (6 wk) sono c/w LMP.  History OB History   Grav Para Term Preterm Abortions TAB SAB Ect Mult Living   1         0     Past Medical History  Diagnosis Date  . Seasonal allergies   . Urinary tract infection   . Hx of varicella   . Chlamydia    Past Surgical History  Procedure Laterality Date  . No past surgeries     Family History: family history includes Anemia in her mother; Cancer in her maternal grandfather; Heart disease in her father; Hyperlipidemia in her father; Hypertension in her father and sister; and Sarcoidosis in her father.  There is no history of Other. Social History:  reports that she has never smoked. She has never used smokeless tobacco. She reports that she does not drink alcohol or use illicit drugs.   Prenatal Transfer Tool  Maternal Diabetes: No Genetic Screening: Normal Maternal Ultrasounds/Referrals: Normal Fetal Ultrasounds or other Referrals:  None Maternal Substance Abuse:  No Significant Maternal Medications:  None Significant Maternal Lab Results:  Lab values include: Group B Strep negative Other Comments:  None  ROS  Pertinent pos and neg stated in HPI  Dilation: Fingertip Effacement (%): 50 Exam by:: Thad Ranger MD  Blood pressure 135/88, pulse 79, temperature 97.8 F (36.6 C), temperature source Oral, resp. rate 18, height 5' 8.5" (1.74 m), weight 94.348 kg (208 lb), last menstrual period 03/25/2012. Exam Physical Exam  Constitutional: She is  oriented to person, place, and time. She appears well-developed and well-nourished. No distress.  HENT:  Head: Normocephalic and atraumatic.  Eyes: Conjunctivae and EOM are normal.  Neck: Normal range of motion. Neck supple.  Cardiovascular: Normal rate.   Respiratory: Effort normal. No respiratory distress.  GI: Soft. There is no tenderness. There is no rebound and no guarding.  Musculoskeletal: She exhibits edema (2+). She exhibits no tenderness.  Neurological: She is oriented to person, place, and time. She displays abnormal reflex (3+ lower extremity, no clonus).  Skin: Skin is warm.  Psychiatric: She has a normal mood and affect.    FHTs:  120, mod var, accels present, 1 min variable to 90s with ctx and early decel to 110 with contraction x 1.  Cat II  Cervix:  FT/50/-2/anterior  Prenatal labs: ABO, Rh: O/Positive/-- (11/25 0000) Antibody: Negative (11/25 0000) Rubella: Immune (11/25 0000) RPR: Nonreactive (11/25 0000)  HBsAg: Negative (11/25 0000)  HIV: Non-reactive (11/25 0000)  GBS: Negative (04/16 0000)   Assessment/Plan: Admit to L&D Induction for GHTN - cytotec F/U protein:creatinine ratio and monitor BP - no mag at this time unless rules in for preeclampsia Vertex by bedside sono GBS neg  Napoleon Form 12/28/2012, 10:56 AM

## 2012-12-28 NOTE — H&P (Signed)
Agree with above note.  Gail Garcilazo H. 12/28/2012 3:39 PM

## 2012-12-29 ENCOUNTER — Encounter (HOSPITAL_COMMUNITY): Payer: Self-pay | Admitting: Anesthesiology

## 2012-12-29 ENCOUNTER — Inpatient Hospital Stay (HOSPITAL_COMMUNITY): Payer: Medicaid Other | Admitting: Anesthesiology

## 2012-12-29 LAB — CBC
HCT: 36 % (ref 36.0–46.0)
MCH: 29.3 pg (ref 26.0–34.0)
MCV: 84.5 fL (ref 78.0–100.0)
Platelets: 219 10*3/uL (ref 150–400)
RBC: 4.15 MIL/uL (ref 3.87–5.11)
RBC: 4.26 MIL/uL (ref 3.87–5.11)
WBC: 14 10*3/uL — ABNORMAL HIGH (ref 4.0–10.5)
WBC: 15 10*3/uL — ABNORMAL HIGH (ref 4.0–10.5)

## 2012-12-29 MED ORDER — FENTANYL 2.5 MCG/ML BUPIVACAINE 1/10 % EPIDURAL INFUSION (WH - ANES)
14.0000 mL/h | INTRAMUSCULAR | Status: DC | PRN
  Administered 2012-12-29: 14 mL/h via EPIDURAL
  Filled 2012-12-29: qty 125

## 2012-12-29 MED ORDER — FENTANYL CITRATE 0.05 MG/ML IJ SOLN
100.0000 ug | INTRAMUSCULAR | Status: DC | PRN
  Administered 2012-12-29 (×2): 100 ug via INTRAVENOUS
  Filled 2012-12-29 (×2): qty 2

## 2012-12-29 MED ORDER — LACTATED RINGERS IV SOLN
500.0000 mL | Freq: Once | INTRAVENOUS | Status: AC
Start: 2012-12-29 — End: 2012-12-29
  Administered 2012-12-29: 500 mL via INTRAVENOUS

## 2012-12-29 MED ORDER — TERBUTALINE SULFATE 1 MG/ML IJ SOLN: 0.2500 mg | Freq: Once | INTRAMUSCULAR | Status: AC | PRN

## 2012-12-29 MED ORDER — EPHEDRINE 5 MG/ML INJ
10.0000 mg | INTRAVENOUS | Status: DC | PRN
  Filled 2012-12-29: qty 2

## 2012-12-29 MED ORDER — EPHEDRINE 5 MG/ML INJ
10.0000 mg | INTRAVENOUS | Status: DC | PRN
  Filled 2012-12-29: qty 2
  Filled 2012-12-29: qty 4

## 2012-12-29 MED ORDER — DIPHENHYDRAMINE HCL 50 MG/ML IJ SOLN: 12.5000 mg | INTRAMUSCULAR | Status: DC | PRN

## 2012-12-29 MED ORDER — LIDOCAINE HCL (PF) 1 % IJ SOLN
INTRAMUSCULAR | Status: DC | PRN
  Administered 2012-12-29 (×4): 4 mL

## 2012-12-29 MED ORDER — PHENYLEPHRINE 40 MCG/ML (10ML) SYRINGE FOR IV PUSH (FOR BLOOD PRESSURE SUPPORT)
80.0000 ug | PREFILLED_SYRINGE | INTRAVENOUS | Status: DC | PRN
  Filled 2012-12-29: qty 5
  Filled 2012-12-29: qty 2

## 2012-12-29 MED ORDER — PHENYLEPHRINE 40 MCG/ML (10ML) SYRINGE FOR IV PUSH (FOR BLOOD PRESSURE SUPPORT)
80.0000 ug | PREFILLED_SYRINGE | INTRAVENOUS | Status: DC | PRN
  Filled 2012-12-29: qty 2

## 2012-12-29 MED ORDER — OXYTOCIN 40 UNITS IN LACTATED RINGERS INFUSION - SIMPLE MED
1.0000 m[IU]/min | INTRAVENOUS | Status: DC
  Administered 2012-12-29: 2 m[IU]/min via INTRAVENOUS

## 2012-12-29 NOTE — Progress Notes (Signed)
Gail Perez is a 22 y.o. G1P0000 at [redacted]w[redacted]d admitted for induction of labor due to [facility].  Subjective: No complaints. Denies ha, scotomata, ruq/epigastric pain, n/v.  Family supportive at bs.   Objective: BP 141/77  Pulse 98  Temp(Src) 98.3 F (36.8 C) (Oral)  Resp 18  Ht 5' 8.5" (1.74 m)  Wt 94.348 kg (208 lb)  BMI 31.16 kg/m2  SpO2 100%  LMP 03/25/2012      FHT:  FHR: 135 bpm, variability: moderate,  accelerations:  Present,  decelerations:  Absent UC:   Mild, irregular SVE:   Dilation: 5 Effacement (%): 80 Station: -2 Exam by:: Dymir Neeson, cnm Foley bulb sitting in vagina- removed w/o difficulty  Labs: Lab Results  Component Value Date   WBC 11.6* 12/28/2012   HGB 11.6* 12/28/2012   HCT 33.5* 12/28/2012   MCV 84.8 12/28/2012   PLT 235 12/28/2012    Assessment / Plan: IOL d/t GHTN, cervical foley bulb now out, will begin pitocin per protocol  Labor: cervical ripening phase complete Preeclampsia:  n/a Fetal Wellbeing:  Category I Pain Control:  n/a I/D:  n/a Anticipated MOD:  NSVD  Marge Duncans 12/29/2012, 9:52 AM

## 2012-12-29 NOTE — Anesthesia Procedure Notes (Signed)
Epidural Patient location during procedure: OB Start time: 12/29/2012 7:35 PM  Staffing Performed by: anesthesiologist   Preanesthetic Checklist Completed: patient identified, site marked, surgical consent, pre-op evaluation, timeout performed, IV checked, risks and benefits discussed and monitors and equipment checked  Epidural Patient position: sitting Prep: site prepped and draped and DuraPrep Patient monitoring: continuous pulse ox and blood pressure Approach: midline Injection technique: LOR air  Needle:  Needle type: Tuohy  Needle gauge: 17 G Needle length: 9 cm and 9 Needle insertion depth: 7 cm Catheter type: closed end flexible Catheter size: 19 Gauge Catheter at skin depth: 12 cm Test dose: negative  Assessment Events: blood not aspirated, injection not painful, no injection resistance, negative IV test and no paresthesia  Additional Notes Discussed risk of headache, infection, bleeding, nerve injury and failed or incomplete block.  Patient voices understanding and wishes to proceed.  Epidural placed easily on first attempt.  No paresthesia. Patient tolerated procedure well with no apparent complications.  Jasmine December, MDReason for block:procedure for pain

## 2012-12-29 NOTE — Anesthesia Preprocedure Evaluation (Addendum)
Anesthesia Evaluation  Patient identified by MRN, date of birth, ID band Patient awake    Reviewed: Allergy & Precautions, H&P , NPO status , Patient's Chart, lab work & pertinent test results, reviewed documented beta blocker date and time   History of Anesthesia Complications Negative for: history of anesthetic complications  Airway Mallampati: II TM Distance: >3 FB Neck ROM: full    Dental  (+) Teeth Intact   Pulmonary neg pulmonary ROS,    + wheezing      Cardiovascular hypertension (PIH), Rhythm:regular Rate:Normal     Neuro/Psych negative neurological ROS  negative psych ROS   GI/Hepatic negative GI ROS, Neg liver ROS,   Endo/Other  negative endocrine ROS  Renal/GU negative Renal ROS     Musculoskeletal   Abdominal   Peds  Hematology negative hematology ROS (+)   Anesthesia Other Findings   Reproductive/Obstetrics (+) Pregnancy                          Anesthesia Physical Anesthesia Plan  ASA: III  Anesthesia Plan: Epidural   Post-op Pain Management:    Induction:   Airway Management Planned:   Additional Equipment:   Intra-op Plan:   Post-operative Plan:   Informed Consent: I have reviewed the patients History and Physical, chart, labs and discussed the procedure including the risks, benefits and alternatives for the proposed anesthesia with the patient or authorized representative who has indicated his/her understanding and acceptance.     Plan Discussed with:   Anesthesia Plan Comments:         Anesthesia Quick Evaluation

## 2012-12-29 NOTE — Progress Notes (Signed)
Gail Perez is a 22 y.o. G1P0000 at [redacted]w[redacted]d admitted for induction of labor due to [facility].  Subjective: Comfortable w/ epidural, no complaints. Family supportive at bs.   Objective: BP 144/81  Pulse 104  Temp(Src) 98.6 F (37 C) (Oral)  Resp 20  Ht 5' 8.5" (1.74 m)  Wt 94.348 kg (208 lb)  BMI 31.16 kg/m2  SpO2 94%  LMP 03/25/2012      FHT:  FHR: 145 bpm, variability: moderate,  accelerations:  Present,  decelerations:  Absent UC:   Not tracing well SVE:   5/80/-2, arom- no fluid noted, but felt hair  After AROM FHT: 135, mod variability, no accels, mild variables w/ uc's  Labs: Lab Results  Component Value Date   WBC 15.0* 12/29/2012   HGB 12.5 12/29/2012   HCT 36.0 12/29/2012   MCV 84.5 12/29/2012   PLT 249 12/29/2012    Assessment / Plan: IOL d/t GHTN, no cervical change w/ pitocin, so now arom'd- pitocin at 43mu/min, RN to continue increasing per protocol as needed to acheive adequate uc pattern/dilation Pt turned to Lt exaggerated sims  Labor: not yet Preeclampsia:  n/a Fetal Wellbeing:  Category II Pain Control:  Epidural I/D:  n/a Anticipated MOD:  NSVD  Marge Duncans 12/29/2012, 8:22 PM

## 2012-12-29 NOTE — Progress Notes (Signed)
Aubriel Khanna is a 22 y.o. G1P0000 at [redacted]w[redacted]d  Subjective: Rec'd cytotec x 3; breathing some with ctx  Objective: BP 96/59  Pulse 87  Temp(Src) 98.4 F (36.9 C) (Oral)  Resp 18  Ht 5' 8.5" (1.74 m)  Wt 208 lb (94.348 kg)  BMI 31.16 kg/m2  SpO2 100%  LMP 03/25/2012      FHT:  FHR: 135 bpm, variability: moderate,  accelerations:  Present,  decelerations:  Absent UC:   irregular, every 2-5 minutes; difficult to trace SVE:   Dilation: 1.5 Effacement (%): 40 Station: -2 Exam by:: Cam Hai CNM foley bulb placed  Labs: Lab Results  Component Value Date   WBC 11.6* 12/28/2012   HGB 11.6* 12/28/2012   HCT 33.5* 12/28/2012   MCV 84.8 12/28/2012   PLT 235 12/28/2012    Assessment / Plan: IOL process GHTN- BPs stable  Will await foley bulb to come out and then plan Pitocin  Darnetta Kesselman 12/29/2012, 2:05 AM

## 2012-12-29 NOTE — Progress Notes (Signed)
Gail Perez is a 22 y.o. G1P0000 at [redacted]w[redacted]d admitted for induction of labor due to [facility].  Subjective: Feeling uc's.  Denies ha, scotomata, ruq/epigastric pain, n/v.  Family supportive at bs.  Objective: BP 151/90  Pulse 89  Temp(Src) 97.9 F (36.6 C) (Oral)  Resp 18  Ht 5' 8.5" (1.74 m)  Wt 94.348 kg (208 lb)  BMI 31.16 kg/m2  SpO2 100%  LMP 03/25/2012      FHT:  FHR: 140 bpm, variability: moderate,  accelerations:  Present,  decelerations:  Absent UC:   irregular, every 2-5 minutes SVE:   Dilation: 5 Effacement (%): 80 Station: -2 Exam by:: Rayona Sardinha, cnm  Labs: Lab Results  Component Value Date   WBC 14.0* 12/29/2012   HGB 12.1 12/29/2012   HCT 35.0* 12/29/2012   MCV 84.3 12/29/2012   PLT 219 12/29/2012    Assessment / Plan: IOL d/t GHTN, pitocin @ 66mu/min, RN to continue increasing per protocol to acheive adequate uc pattern/dilation  Labor: not yet Preeclampsia:  n/a Fetal Wellbeing:  Category I Pain Control:  Labor support without medications I/D:  n/a Anticipated MOD:  NSVD  Marge Duncans 12/29/2012, 1:51 PM

## 2012-12-30 ENCOUNTER — Encounter (HOSPITAL_COMMUNITY): Payer: Self-pay | Admitting: *Deleted

## 2012-12-30 DIAGNOSIS — O139 Gestational [pregnancy-induced] hypertension without significant proteinuria, unspecified trimester: Secondary | ICD-10-CM

## 2012-12-30 LAB — CBC
HCT: 34.5 % — ABNORMAL LOW (ref 36.0–46.0)
Hemoglobin: 11.8 g/dL — ABNORMAL LOW (ref 12.0–15.0)
MCHC: 34.2 g/dL (ref 30.0–36.0)
RBC: 4.1 MIL/uL (ref 3.87–5.11)

## 2012-12-30 MED ORDER — BENZOCAINE-MENTHOL 20-0.5 % EX AERO
1.0000 "application " | INHALATION_SPRAY | CUTANEOUS | Status: DC | PRN
  Filled 2012-12-30: qty 56

## 2012-12-30 MED ORDER — MEASLES, MUMPS & RUBELLA VAC ~~LOC~~ INJ
0.5000 mL | INJECTION | Freq: Once | SUBCUTANEOUS | Status: DC
  Filled 2012-12-30: qty 0.5

## 2012-12-30 MED ORDER — PRENATAL MULTIVITAMIN CH
1.0000 | ORAL_TABLET | Freq: Every day | ORAL | Status: DC
  Administered 2012-12-30 – 2012-12-31 (×2): 1 via ORAL
  Filled 2012-12-30 (×2): qty 1

## 2012-12-30 MED ORDER — ONDANSETRON HCL 4 MG/2ML IJ SOLN: 4.0000 mg | INTRAMUSCULAR | Status: DC | PRN

## 2012-12-30 MED ORDER — TETANUS-DIPHTH-ACELL PERTUSSIS 5-2.5-18.5 LF-MCG/0.5 IM SUSP: 0.5000 mL | Freq: Once | INTRAMUSCULAR | Status: DC

## 2012-12-30 MED ORDER — ONDANSETRON HCL 4 MG PO TABS: 4.0000 mg | ORAL_TABLET | ORAL | Status: DC | PRN

## 2012-12-30 MED ORDER — WITCH HAZEL-GLYCERIN EX PADS: 1.0000 "application " | MEDICATED_PAD | CUTANEOUS | Status: DC | PRN

## 2012-12-30 MED ORDER — OXYCODONE-ACETAMINOPHEN 5-325 MG PO TABS
1.0000 | ORAL_TABLET | ORAL | Status: DC | PRN
  Administered 2012-12-30 – 2012-12-31 (×5): 1 via ORAL
  Filled 2012-12-30 (×5): qty 1

## 2012-12-30 MED ORDER — SODIUM CHLORIDE 0.9 % IJ SOLN: 3.0000 mL | Freq: Two times a day (BID) | INTRAMUSCULAR | Status: DC

## 2012-12-30 MED ORDER — SODIUM CHLORIDE 0.9 % IV SOLN: 250.0000 mL | INTRAVENOUS | Status: DC | PRN

## 2012-12-30 MED ORDER — DIBUCAINE 1 % RE OINT: 1.0000 "application " | TOPICAL_OINTMENT | RECTAL | Status: DC | PRN

## 2012-12-30 MED ORDER — IBUPROFEN 600 MG PO TABS
600.0000 mg | ORAL_TABLET | Freq: Four times a day (QID) | ORAL | Status: DC
  Administered 2012-12-30 – 2012-12-31 (×6): 600 mg via ORAL
  Filled 2012-12-30 (×7): qty 1

## 2012-12-30 MED ORDER — FLEET ENEMA 7-19 GM/118ML RE ENEM: 1.0000 | ENEMA | Freq: Every day | RECTAL | Status: DC | PRN

## 2012-12-30 MED ORDER — SIMETHICONE 80 MG PO CHEW: 80.0000 mg | CHEWABLE_TABLET | ORAL | Status: DC | PRN

## 2012-12-30 MED ORDER — SODIUM CHLORIDE 0.9 % IJ SOLN: 3.0000 mL | INTRAMUSCULAR | Status: DC | PRN

## 2012-12-30 MED ORDER — OXYTOCIN 40 UNITS IN LACTATED RINGERS INFUSION - SIMPLE MED: 62.5000 mL/h | INTRAVENOUS | Status: DC | PRN

## 2012-12-30 MED ORDER — LANOLIN HYDROUS EX OINT: TOPICAL_OINTMENT | CUTANEOUS | Status: DC | PRN

## 2012-12-30 MED ORDER — BISACODYL 10 MG RE SUPP: 10.0000 mg | Freq: Every day | RECTAL | Status: DC | PRN

## 2012-12-30 MED ORDER — NITROFURANTOIN MONOHYD MACRO 100 MG PO CAPS
100.0000 mg | ORAL_CAPSULE | Freq: Two times a day (BID) | ORAL | Status: DC
  Administered 2012-12-30 – 2012-12-31 (×3): 100 mg via ORAL
  Filled 2012-12-30 (×5): qty 1

## 2012-12-30 MED ORDER — DIPHENHYDRAMINE HCL 25 MG PO CAPS: 25.0000 mg | ORAL_CAPSULE | Freq: Four times a day (QID) | ORAL | Status: DC | PRN

## 2012-12-30 MED ORDER — SENNOSIDES-DOCUSATE SODIUM 8.6-50 MG PO TABS
2.0000 | ORAL_TABLET | Freq: Every day | ORAL | Status: DC
  Administered 2012-12-30: 2 via ORAL

## 2012-12-30 MED ORDER — ZOLPIDEM TARTRATE 5 MG PO TABS: 5.0000 mg | ORAL_TABLET | Freq: Every evening | ORAL | Status: DC | PRN

## 2012-12-30 NOTE — Progress Notes (Signed)
Gail Perez is a 22 y.o. G1P0000 at [redacted]w[redacted]d admitted for induction of labor due to [facility].  Subjective: Feeling pelvic/rectal pressure  Objective: BP 159/92  Pulse 105  Temp(Src) 98.2 F (36.8 C) (Oral)  Resp 20  Ht 5' 8.5" (1.74 m)  Wt 94.348 kg (208 lb)  BMI 31.16 kg/m2  SpO2 96%  LMP 03/25/2012   Total I/O In: -  Out: 250 [Urine:250]  FHT:  FHR: 120 bpm, variability: moderate,  accelerations:  Present,  decelerations:  Present mild variables w/ uc's UC:   regular, every 2-3 minutes SVE:   Dilation: 10 Effacement (%): 100 Station: 0 Exam by:: Ace Gins, RN  Labs: Lab Results  Component Value Date   WBC 15.0* 12/29/2012   HGB 12.5 12/29/2012   HCT 36.0 12/29/2012   MCV 84.5 12/29/2012   PLT 249 12/29/2012    Assessment / Plan: Induction of labor due to GHTN,  progressing well on pitocin, pt wants to try to push- if not moving vtx well, allow to labor down  Labor: Progressing normally Preeclampsia:  n/a Fetal Wellbeing:  Category II Pain Control:  Epidural I/D:  n/a Anticipated MOD:  NSVD  Marge Duncans 12/30/2012, 12:36 AM

## 2012-12-30 NOTE — Anesthesia Postprocedure Evaluation (Signed)
Anesthesia Post Note  Patient: Gail Perez  Procedure(s) Performed: * No procedures listed *  Anesthesia type: Epidural  Patient location: Mother/Baby  Post pain: Pain level controlled  Post assessment: Post-op Vital signs reviewed  Last Vitals: BP 137/61  Pulse 74  Temp(Src) 36.7 C (Oral)  Resp 18  Ht 5' 8.5" (1.74 m)  Wt 208 lb (94.348 kg)  BMI 31.16 kg/m2  SpO2 99%  LMP 03/25/2012  Post vital signs: Reviewed  Level of consciousness: awake  Complications: No apparent anesthesia complications

## 2012-12-31 MED ORDER — SENNOSIDES-DOCUSATE SODIUM 8.6-50 MG PO TABS: 2.0000 | ORAL_TABLET | Freq: Every day | ORAL | Status: DC

## 2012-12-31 MED ORDER — IBUPROFEN 600 MG PO TABS: 600.0000 mg | ORAL_TABLET | Freq: Four times a day (QID) | ORAL | Status: DC

## 2012-12-31 MED ORDER — NORETHINDRONE 0.35 MG PO TABS: 1.0000 | ORAL_TABLET | Freq: Every day | ORAL | Status: DC

## 2012-12-31 NOTE — Discharge Summary (Signed)
Attestation of Attending Supervision of Obstetric Fellow: Evaluation and management procedures were performed by the Obstetric Fellow under my supervision and collaboration.  I have reviewed the Obstetric Fellow's note and chart, and I agree with the management and plan.  Tamla Winkels, MD, FACOG Attending Obstetrician & Gynecologist Faculty Practice, [facility]   

## 2012-12-31 NOTE — Progress Notes (Signed)
UR chart review completed.  

## 2012-12-31 NOTE — Discharge Summary (Signed)
Obstetric Discharge Summary Gail Perez is a 22 y.o. G1P1001 presenting at [redacted]w[redacted]d for IOL for gestational HTN. She had a normal SVD with no complications. Her postpartum course was unremarkable except for elevated blood pressures in the 150-160s systolic. However, her PIH labs were negative, and on the day of discharge, her blood pressures were normal. She is breast feeding and plans Micronor for contraception.   Reason for Admission: induction of labor Prenatal Procedures: none Intrapartum Procedures: spontaneous vaginal delivery Postpartum Procedures: none Complications-Operative and Postpartum: vaginal laceration Hemoglobin  Date Value Range Status  12/30/2012 11.8* 12.0 - 15.0 g/dL Final     HCT  Date Value Range Status  12/30/2012 34.5* 36.0 - 46.0 % Final    Physical Exam:  General: alert, cooperative and no distress Lochia: appropriate Uterine Fundus: firm Incision n/a DVT Evaluation: No evidence of DVT seen on physical exam. Negative Homan's sign. No cords or calf tenderness. Calf/Ankle edema is present.  Discharge Diagnoses: Term Pregnancy-delivered and gestational hypertension  Discharge Information: Date: 12/31/2012 Activity: pelvic rest Diet: routine Medications: PNV, Ibuprofen and Colace Condition: stable Instructions: refer to practice specific booklet Discharge to: home - Baby Love nurse visit in 3-4 days for BP check Follow-up Information   Follow up with [facility] [place]. Schedule an appointment as soon as possible for a visit in 6 weeks. (For postpartum visit)    Contact information:   [address] [place] [address] [phone]      Newborn Data: Live born female  Birth Weight: 6 lb 11.8 oz (3055 g) APGAR: 8, 9  Home with mother.  Napoleon Form 12/31/2012, 9:04 AM

## 2012-12-31 NOTE — Progress Notes (Signed)
Post Partum Day #1 Subjective: no complaints, up ad lib, voiding, tolerating PO, + flatus and overall feels well.  She was admitted with a history of GHTN.    Objective: Blood pressure 124/74, pulse 71, temperature 98.1 F (36.7 C), temperature source Oral, resp. rate 18, height 5' 8.5" (1.74 m), weight 94.348 kg (208 lb), last menstrual period 03/25/2012, SpO2 98.00%, unknown if currently breastfeeding.  Physical Exam:  General: alert, cooperative, appears stated age and no distress Lochia: appropriate Uterine Fundus: firm Incision: N/A DVT Evaluation: No evidence of DVT seen on physical exam. Negative Homan's sign. Calf/Ankle edema is present.  She states that edema is improving.     Recent Labs  12/29/12 1828 12/30/12 0225  HGB 12.5 11.8*  HCT 36.0 34.5*    Assessment/Plan: Patient states that she would like to go home today if possible.  She plans to breast feed and has met with the lactation consultants.  She also plans to use pills for her birth control.  She has had documentation of a few episodes of elevated blood pressure in the last 24 hours as high as 160/65, so plan to see her back in a few days after discharge for a BP check.     LOS: 3 days   Anna Genre 12/31/2012, 7:45 AM

## 2013-01-01 NOTE — Progress Notes (Signed)
I saw and examined patient and agree with above student note. I reviewed history, delivery summary, labs and vitals. Saroya Riccobono, MD  

## 2013-08-19 IMAGING — US US OB COMP LESS 14 WK
2 series · 14 of 28 positions shown · non-contrast
Comparison: None.

CLINICAL DATA: Pelvic pain.  5 weeks and 2 days pregnant by last
menstrual period.

OBSTETRIC <14 WK US AND TRANSVAGINAL OB US
TECHNIQUE: Both transabdominal and transvaginal ultrasound
examinations were performed for complete evaluation of the
gestation as well as the maternal uterus, adnexal regions, and
pelvic cul-de-sac.  Transvaginal technique was performed to assess
early pregnancy.

[Series 1: us ob comp less 14 wks · 26 acquisitions, 9 frames shown (1 of 2)]
[im 2/26]
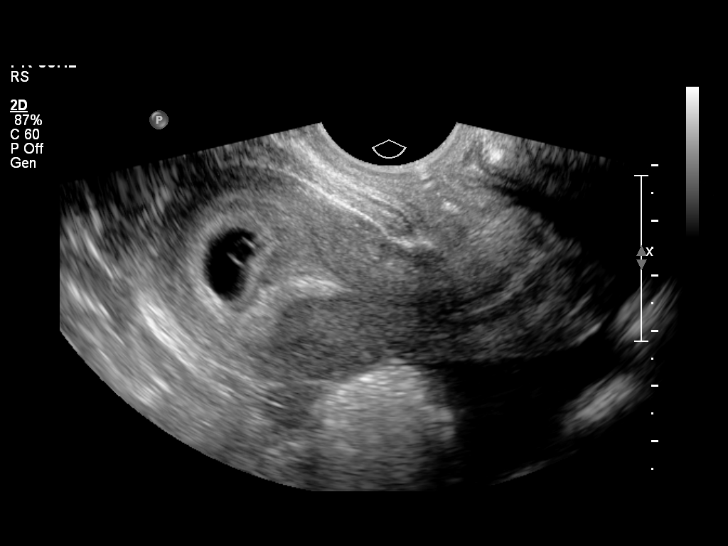
[im 5/26]
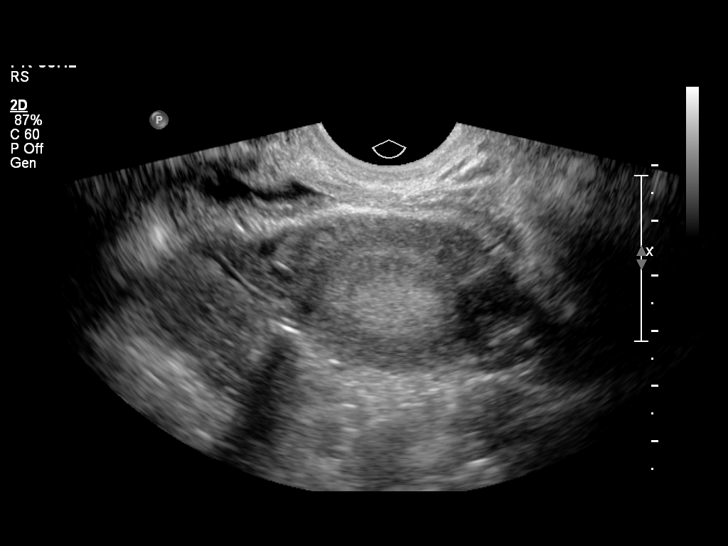
[im 8/26]
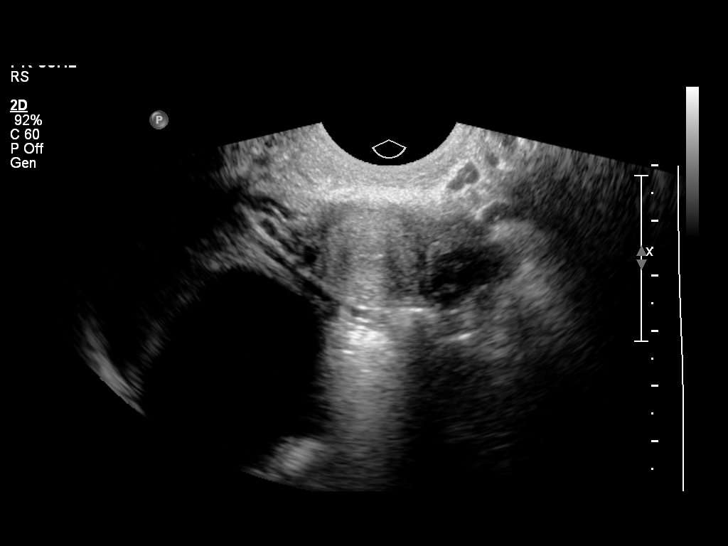
[im 11/26]
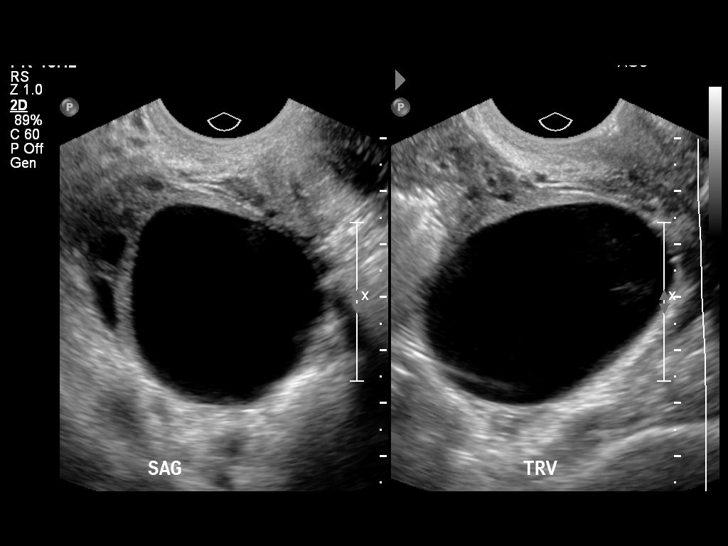
[im 14/26]
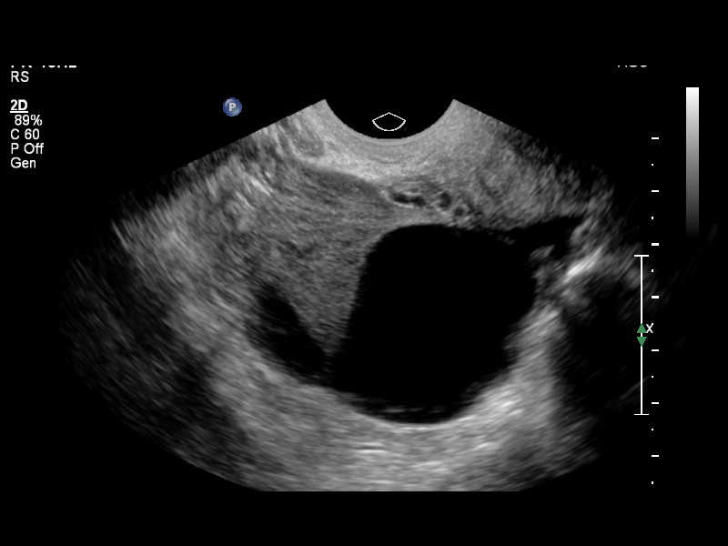
[im 17/26]
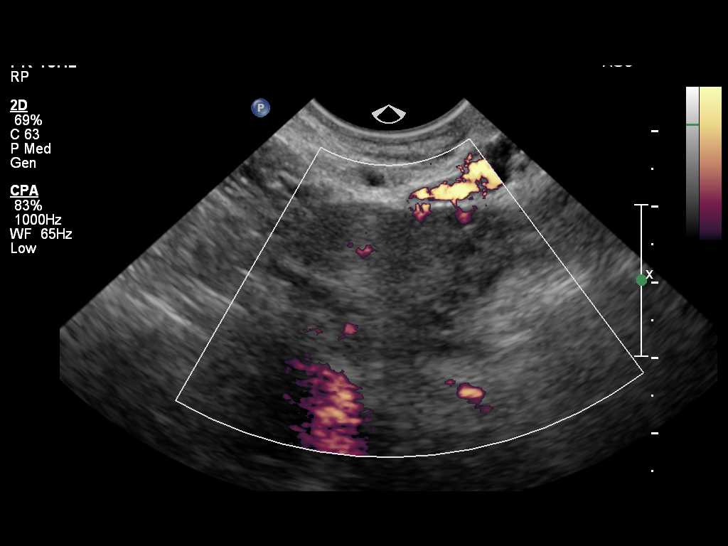
[im 20/26]
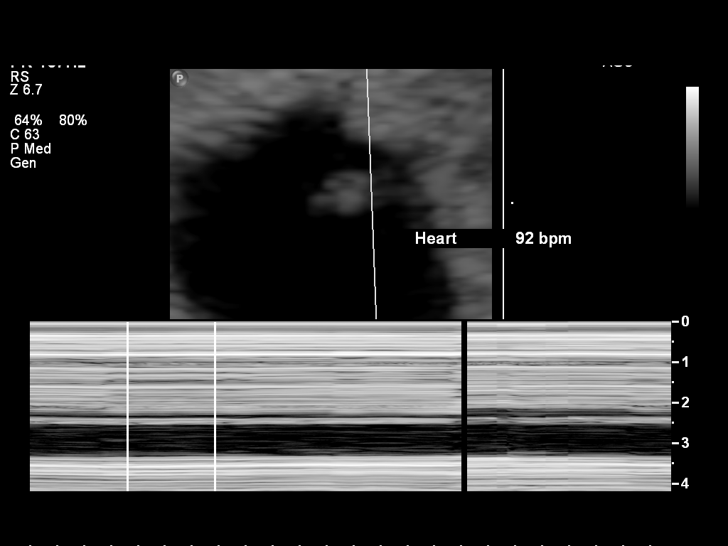
[im 23/26]
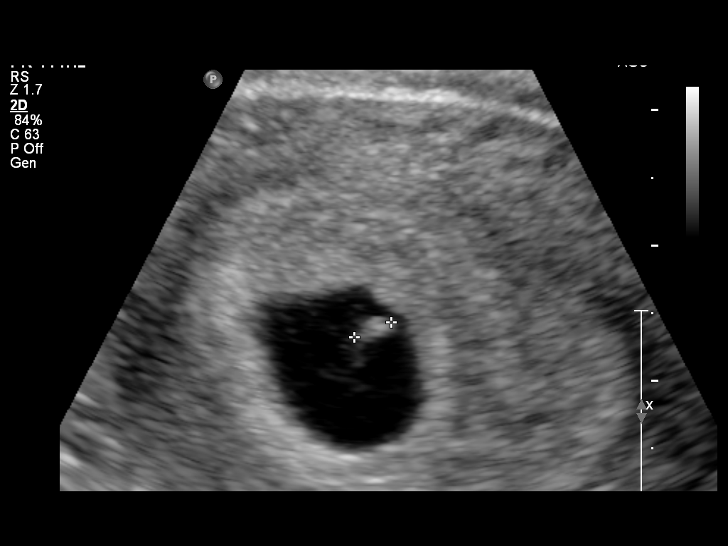
[im 26/26]
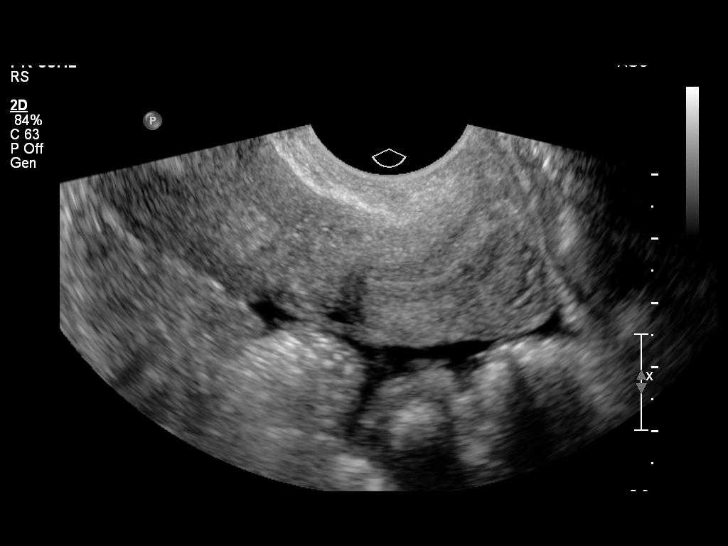

[Series 1: us ob comp less 14 wks · 5 of 15 slices shown (2 of 2)]
[im 2/15]
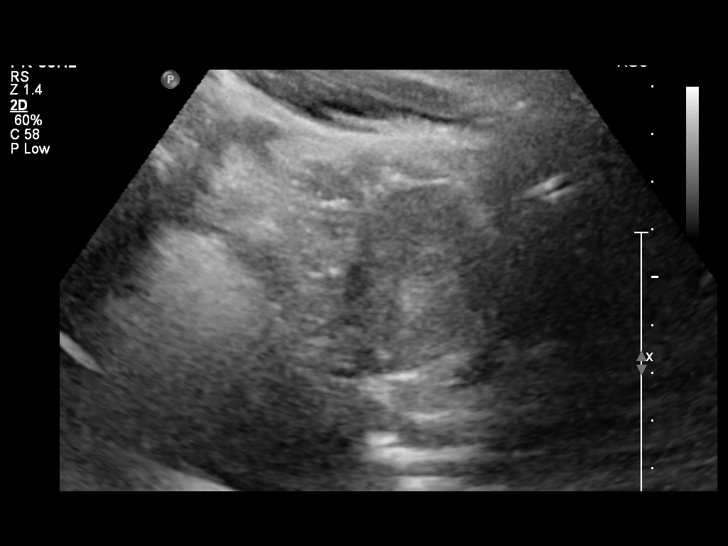
[im 5/15]
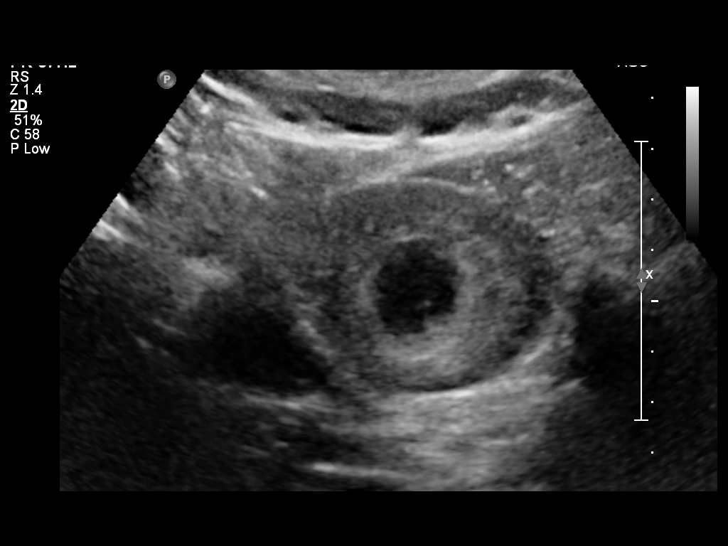
[im 8/15]
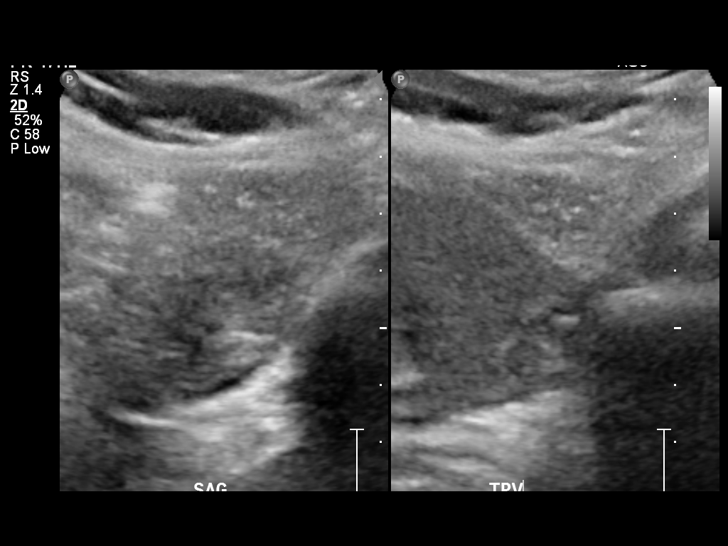
[im 11/15]
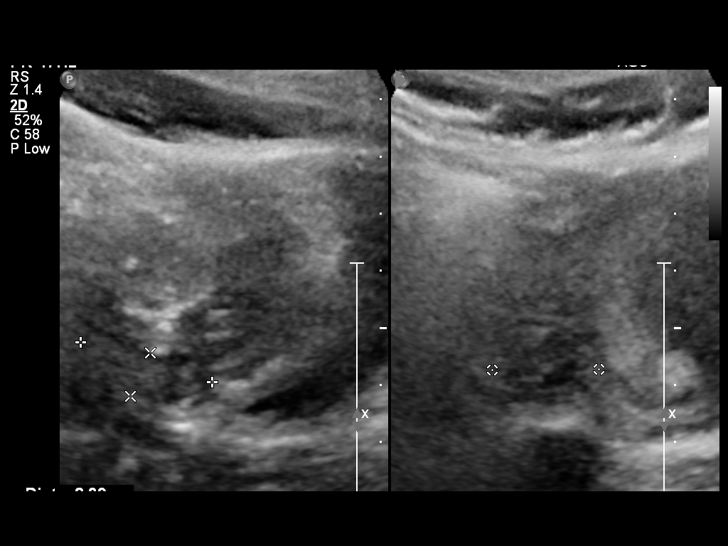
[im 15/15]
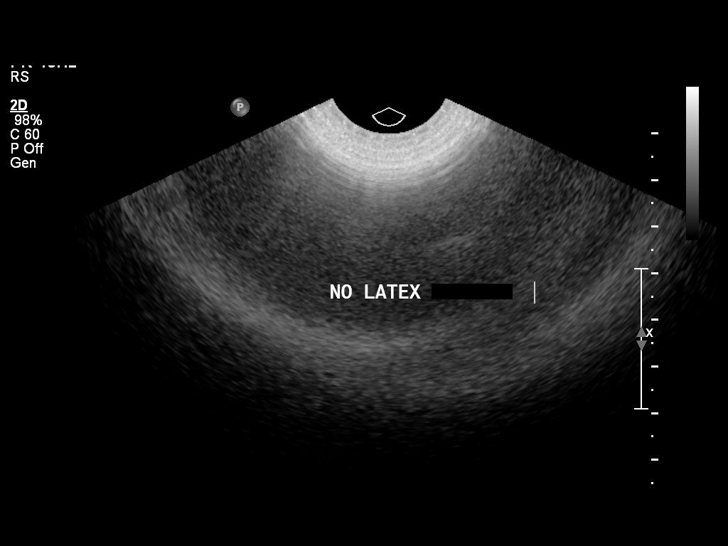

[14 of 28 positions shown; findings below may reference images not displayed]

Intrauterine gestational sac:  Visualized/normal in shape.
Yolk sac: Visualized/normal in shape.
Embryo: Visualized.
Cardiac Activity: Visualized.
Heart Rate: 92 bpm

CRL: 2.9  mm  6 w  0 d        US EDC: 12/25/2012.

Maternal uterus/adnexae:
4.7 cm simple cyst in the right ovary.  Normal appearing left
ovary.  No subchorionic hemorrhage.  Small amount of free
peritoneal fluid, within normal limits of physiological fluid.
IMPRESSION: Single live intrauterine gestation with an estimated gestational
age of 6 weeks and 0 days.  No complicating features.

## 2013-09-02 ENCOUNTER — Encounter (HOSPITAL_BASED_OUTPATIENT_CLINIC_OR_DEPARTMENT_OTHER): Payer: Self-pay | Admitting: Emergency Medicine

## 2013-09-02 ENCOUNTER — Emergency Department (HOSPITAL_BASED_OUTPATIENT_CLINIC_OR_DEPARTMENT_OTHER)
Admission: EM | Admit: 2013-09-02 | Discharge: 2013-09-02 | Disposition: A | Discharge status: Home / Self Care | Payer: BC Managed Care – PPO | Attending: Emergency Medicine | Admitting: Emergency Medicine

## 2013-09-02 DIAGNOSIS — H6590 Unspecified nonsuppurative otitis media, unspecified ear: Secondary | ICD-10-CM | POA: Insufficient documentation

## 2013-09-02 DIAGNOSIS — Z87440 Personal history of urinary (tract) infections: Secondary | ICD-10-CM | POA: Insufficient documentation

## 2013-09-02 DIAGNOSIS — R599 Enlarged lymph nodes, unspecified: Secondary | ICD-10-CM | POA: Insufficient documentation

## 2013-09-02 DIAGNOSIS — J029 Acute pharyngitis, unspecified: Secondary | ICD-10-CM | POA: Insufficient documentation

## 2013-09-02 DIAGNOSIS — Z8619 Personal history of other infectious and parasitic diseases: Secondary | ICD-10-CM | POA: Insufficient documentation

## 2013-09-02 DIAGNOSIS — R51 Headache: Secondary | ICD-10-CM | POA: Insufficient documentation

## 2013-09-02 MED ORDER — ANTIPYRINE-BENZOCAINE 5.4-1.4 % OT SOLN
3.0000 [drp] | Freq: Once | OTIC | Status: AC
  Administered 2013-09-02: 4 [drp] via OTIC
  Filled 2013-09-02: qty 10

## 2013-09-02 MED ORDER — AMOXICILLIN 500 MG PO CAPS: 500.0000 mg | ORAL_CAPSULE | Freq: Three times a day (TID) | ORAL | Status: DC

## 2013-09-02 NOTE — ED Notes (Signed)
Right ear ache x 4 days

## 2013-09-02 NOTE — ED Provider Notes (Signed)
CSN: [id number]     Arrival date & time 09/02/13  1544 History   First MD Initiated Contact with Patient 09/02/13 1611     Chief Complaint  Patient presents with  . Otalgia   (Consider location/radiation/quality/duration/timing/severity/associated sxs/prior Treatment) HPI Comments: Patient is otherwise healthy 23 year old female who presents to the ED with 4 day history of right ear pain, swollen right glands, and sore throat on the right side.  She denies fever, chills, but reports headache, denies neck stiffness, recent sick contacts.  Patient is a 23 y.o. female presenting with ear pain. The history is provided by the patient. No language interpreter was used.  Otalgia Location:  Right Behind ear:  No abnormality Quality:  Aching and sore Severity:  Moderate Onset quality:  Gradual Duration:  4 days Timing:  Constant Progression:  Worsening Chronicity:  New Context: not direct blow, not elevation change, not foreign body in ear, not loud noise and no water in ear   Relieved by:  Nothing Worsened by:  Nothing tried Ineffective treatments:  None tried Associated symptoms: ear discharge, headaches, rhinorrhea and sore throat   Associated symptoms: no congestion, no cough, no diarrhea, no fever, no hearing loss, no rash, no tinnitus and no vomiting     Past Medical History  Diagnosis Date  . Seasonal allergies   . Urinary tract infection   . Hx of varicella   . Chlamydia    Past Surgical History  Procedure Laterality Date  . No past surgeries     Family History  Problem Relation Age of Onset  . Other Neg Hx   . Hypertension Father   . Hyperlipidemia Father   . Heart disease Father     enlatged heart  . Sarcoidosis Father   . Cancer Maternal Grandfather     lung  . Anemia Mother   . Hypertension Sister    History  Substance Use Topics  . Smoking status: Never Smoker   . Smokeless tobacco: Never Used  . Alcohol Use: No   OB History   Grav Para Term Preterm  Abortions TAB SAB Ect Mult Living   1 1 1  0 0 0 0 0 0 1     Review of Systems  Constitutional: Negative for fever.  HENT: Positive for ear discharge, ear pain, rhinorrhea and sore throat. Negative for congestion, hearing loss and tinnitus.   Respiratory: Negative for cough.   Gastrointestinal: Negative for vomiting and diarrhea.  Skin: Negative for rash.  Neurological: Positive for headaches.  All other systems reviewed and are negative.    Allergies  Review of patient's allergies indicates no known allergies.  Home Medications   Current Outpatient Rx  Name  Route  Sig  Dispense  Refill  . ibuprofen (ADVIL,MOTRIN) 600 MG tablet   Oral   Take 1 tablet (600 mg total) by mouth every 6 (six) hours.   30 tablet   1   . norethindrone (ORTHO MICRONOR) 0.35 MG tablet   Oral   Take 1 tablet (0.35 mg total) by mouth daily. Start no sooner than 3 weeks from now.   1 Package   11   . Prenatal Vit-Fe Fumarate-FA (PRENATAL MULTIVITAMIN) TABS   Oral   Take 1 tablet by mouth at bedtime.         . senna-docusate (SENOKOT-S) 8.6-50 MG per tablet   Oral   Take 2 tablets by mouth at bedtime.   30 tablet   1    BP  129/77  Pulse 77  Temp(Src) 98.1 F (36.7 C) (Oral)  Resp 16  Ht 5\' 8"  (1.727 m)  Wt 170 lb (77.111 kg)  BMI 25.85 kg/m2  SpO2 100%  LMP 08/24/2013 Physical Exam  Nursing note and vitals reviewed. Constitutional: She appears well-developed and well-nourished. No distress.  HENT:  Head: Normocephalic and atraumatic.  Right Ear: Ear canal normal. No drainage. Tympanic membrane is erythematous and bulging. A middle ear effusion is present. No decreased hearing is noted.  Left Ear: External ear and ear canal normal.  Nose: Nose normal.  Mouth/Throat: Oropharynx is clear and moist. No oropharyngeal exudate.  Eyes: Conjunctivae are normal. Pupils are equal, round, and reactive to light. No scleral icterus.  Neck: Normal range of motion. Neck supple.  Right anterior  cervical lymphadenopathy  Cardiovascular: Normal rate, regular rhythm and normal heart sounds.  Exam reveals no gallop and no friction rub.   No murmur heard. Pulmonary/Chest: Effort normal and breath sounds normal. No respiratory distress. She has no wheezes. She has no rales. She exhibits no tenderness.  Abdominal: Soft. Bowel sounds are normal. She exhibits no distension. There is no tenderness.  Lymphadenopathy:    She has cervical adenopathy.  Skin: Skin is warm and dry. No rash noted. No erythema. No pallor.  Psychiatric: She has a normal mood and affect. Her behavior is normal. Judgment and thought content normal.    ED Course  Procedures (including critical care time) Labs Review Labs Reviewed - No data to display Imaging Review No results found.  EKG Interpretation   None       MDM  Right OM  Patient in normal state of health presents with right OM, no evidence of mastoiditis, otitis externa.  Given auralgan drops here and started on abx.    Izola PriceFrances C. Marisue HumbleSanford, New JerseyPA-C 09/02/13 1648

## 2013-09-02 NOTE — Discharge Instructions (Signed)
Otitis Media, Adult Otitis media is redness, soreness, and swelling (inflammation) of the middle ear. Otitis media may be caused by allergies or, most commonly, by infection. Often it occurs as a complication of the common cold. SIGNS AND SYMPTOMS Symptoms of otitis media may include:  Earache.  Fever.  Ringing in your ear.  Headache.  Leakage of fluid from the ear. DIAGNOSIS To diagnose otitis media, your health care provider will examine your ear with an otoscope. This is an instrument that allows your health care provider to see into your ear in order to examine your eardrum. Your health care provider also will ask you questions about your symptoms. TREATMENT  Typically, otitis media resolves on its own within 3 5 days. Your health care provider may prescribe medicine to ease your symptoms of pain. If otitis media does not resolve within 5 days or is recurrent, your health care provider may prescribe antibiotic medicines if he or she suspects that a bacterial infection is the cause. HOME CARE INSTRUCTIONS   Take your medicine as directed until it is gone, even if you feel better after the first few days.  Only take over-the-counter or prescription medicines for pain, discomfort, or fever as directed by your health care provider.  Follow up with your health care provider as directed. SEEK MEDICAL CARE IF:  You have otitis media only in one ear or bleeding from your nose or both.  You notice a lump on your neck.  You are not getting better in 3 5 days.  You feel worse instead of better. SEEK IMMEDIATE MEDICAL CARE IF:   You have pain that is not controlled with medicine.  You have swelling, redness, or pain around your ear or stiffness in your neck.  You notice that part of your face is paralyzed.  You notice that the bone behind your ear (mastoid) is tender when you touch it. MAKE SURE YOU:   Understand these instructions.  Will watch your condition.  Will get help  right away if you are not doing well or get worse. Document Released: 05/13/2004 Document Revised: 05/29/2013 Document Reviewed: 03/05/2013 [employer] Patient Information 2014 [employer].  

## 2013-09-03 NOTE — ED Provider Notes (Signed)
Medical screening examination/treatment/procedure(s) were performed by non-physician practitioner and as supervising physician I was immediately available for consultation/collaboration.  EKG Interpretation   None         Audree CamelScott T Juma Oxley, MD 09/03/13 [phone]

## 2013-11-22 ENCOUNTER — Emergency Department (HOSPITAL_BASED_OUTPATIENT_CLINIC_OR_DEPARTMENT_OTHER): Payer: BC Managed Care – PPO

## 2013-11-22 ENCOUNTER — Encounter (HOSPITAL_BASED_OUTPATIENT_CLINIC_OR_DEPARTMENT_OTHER): Payer: Self-pay | Admitting: Emergency Medicine

## 2013-11-22 DIAGNOSIS — Z8619 Personal history of other infectious and parasitic diseases: Secondary | ICD-10-CM | POA: Insufficient documentation

## 2013-11-22 DIAGNOSIS — Z87440 Personal history of urinary (tract) infections: Secondary | ICD-10-CM | POA: Insufficient documentation

## 2013-11-22 DIAGNOSIS — Z791 Long term (current) use of non-steroidal anti-inflammatories (NSAID): Secondary | ICD-10-CM | POA: Insufficient documentation

## 2013-11-22 DIAGNOSIS — T18108A Unspecified foreign body in esophagus causing other injury, initial encounter: Secondary | ICD-10-CM | POA: Insufficient documentation

## 2013-11-22 DIAGNOSIS — Z792 Long term (current) use of antibiotics: Secondary | ICD-10-CM | POA: Insufficient documentation

## 2013-11-22 DIAGNOSIS — Y929 Unspecified place or not applicable: Secondary | ICD-10-CM | POA: Insufficient documentation

## 2013-11-22 DIAGNOSIS — Y9389 Activity, other specified: Secondary | ICD-10-CM | POA: Insufficient documentation

## 2013-11-22 DIAGNOSIS — Z79899 Other long term (current) drug therapy: Secondary | ICD-10-CM | POA: Insufficient documentation

## 2013-11-22 DIAGNOSIS — IMO0002 Reserved for concepts with insufficient information to code with codable children: Secondary | ICD-10-CM | POA: Insufficient documentation

## 2013-11-22 NOTE — ED Notes (Signed)
Fish bone got stuck in throat this afternoon.  Sts she has tried to eat bread to dislodge it but it won't move.  Cannot visualize the bone. Airway patent.

## 2013-11-23 ENCOUNTER — Emergency Department (HOSPITAL_BASED_OUTPATIENT_CLINIC_OR_DEPARTMENT_OTHER)
Admission: EM | Admit: 2013-11-23 | Discharge: 2013-11-23 | Disposition: A | Discharge status: Home / Self Care | Payer: BC Managed Care – PPO | Attending: Emergency Medicine | Admitting: Emergency Medicine

## 2013-11-23 DIAGNOSIS — T18128A Food in esophagus causing other injury, initial encounter: Secondary | ICD-10-CM

## 2013-11-23 MED ORDER — GLUCAGON HCL (RDNA) 1 MG IJ SOLR
1.0000 mg | Freq: Once | INTRAMUSCULAR | Status: AC
  Administered 2013-11-23: 1 mg via INTRAMUSCULAR
  Filled 2013-11-23: qty 1

## 2013-11-23 MED ORDER — NITROGLYCERIN 0.4 MG SL SUBL
0.4000 mg | SUBLINGUAL_TABLET | SUBLINGUAL | Status: DC | PRN
  Filled 2013-11-23: qty 25

## 2013-11-23 MED ORDER — NITROGLYCERIN 0.4 MG SL SUBL
SUBLINGUAL_TABLET | SUBLINGUAL | Status: AC
  Administered 2013-11-23: 0.4 mg
  Filled 2013-11-23: qty 1

## 2013-11-23 MED ORDER — ONDANSETRON 8 MG PO TBDP
8.0000 mg | ORAL_TABLET | Freq: Once | ORAL | Status: AC
  Administered 2013-11-23: 8 mg via ORAL
  Filled 2013-11-23: qty 1

## 2013-11-23 NOTE — Discharge Instructions (Signed)
We saw you in the ER for the food impaction. Seems like the bone passed while in the ER. If you feel like something is still stuck tomorrow- come back to the ER. Please see the GI doctor in 2 weeks - sometimes food impaction can lead to complications, and it is recommended that you see the doctor to ensure you would ot have any complications.   Swallowed Foreign Body, Adult You have swallowed an object (foreign body). Once the foreign body has passed through the food tube (esophagus), which leads from the mouth to the stomach, it will usually continue through the body without problems. This is because the point where the esophagus enters into the stomach is the narrowest place through which the foreign body must pass. Sometimes the foreign body gets stuck. The most common type of foreign body obstruction in adults is food impaction. Many times, bones from fish or meat products may become lodged in the esophagus or injure the throat on the way down. When there is an object that obstructs the esophagus, the most obvious symptoms are pain and the inability to swallow normally. In some cases, foreign bodies that can be life threatening are swallowed. Examples of these are certain medications and illicit drugs. Often in these instances, patients are afraid of telling what they swallowed. However, it is extremely important to tell the emergency caregiver what was swallowed because life-saving treatment may be needed.  X-ray exams may be taken to find the location of the foreign body. However, some objects do not show up well or may be too small to be seen on an X-ray image. If the foreign body is too large or too sharp, it may be too dangerous to allow it to pass on its own. You may need to see a caregiver who specializes in the digestive system (gastroenterologist). In a few cases, a specialist may need to remove the object using a method called "endoscopy". This involves passing a thin, soft, flexible tube  into the food pipe to locate and remove the object. Follow up with your primary doctor or the referral you were given by the emergency caregiver. HOME CARE INSTRUCTIONS   If your caregiver says it is safe for you to eat, then only have liquids and soft foods until your symptoms improve.  Once you are eating normally:  Cut food into small pieces.  Remove small bones from food.  Remove large seeds and pits from fruit.  Chew your food well.  Do not talk, laugh, or engage in physical activity while eating or swallowing. SEEK MEDICAL CARE IF:  You develop worsening shortness of breath, uncontrollable coughing, chest pains or high fever, greater than 102 F (38.9 C).  You are unable to eat or drink or you feel that food is getting stuck in your throat.  You have choking symptoms or cannot stop drooling.  You develop abdominal pain, vomiting (especially of blood), or rectal bleeding. MAKE SURE YOU:   Understand these instructions.  Will watch your condition.  Will get help right away if you are not doing well or get worse. Document Released: 01/26/2010 Document Revised: 10/31/2011 Document Reviewed: 01/26/2010 [facility] ServicesExitCare Patient Information 2014 [place], [place].

## 2013-11-23 NOTE — ED Provider Notes (Signed)
CSN: [id number]     Arrival date & time 11/22/13  2222 History   First MD Initiated Contact with Patient 11/23/13 0044     Chief Complaint  Patient presents with  . Fish bone stuck in throat      (Consider location/radiation/quality/duration/timing/severity/associated sxs/prior Treatment) HPI Comments: Pt comes in with cc of food impaction. States that she was eating fish this afternoon, and it felt that something got stuck to her throat. She tried drinking water, having bread with no success - so finally decided to come to come to the ER. Her sx are present only with swallowing and speaking - and she feels a sharp pain at those times.  The history is provided by the patient.    Past Medical History  Diagnosis Date  . Seasonal allergies   . Urinary tract infection   . Hx of varicella   . Chlamydia    Past Surgical History  Procedure Laterality Date  . No past surgeries     Family History  Problem Relation Age of Onset  . Other Neg Hx   . Hypertension Father   . Hyperlipidemia Father   . Heart disease Father     enlatged heart  . Sarcoidosis Father   . Cancer Maternal Grandfather     lung  . Anemia Mother   . Hypertension Sister    History  Substance Use Topics  . Smoking status: Never Smoker   . Smokeless tobacco: Never Used  . Alcohol Use: No   OB History   Grav Para Term Preterm Abortions TAB SAB Ect Mult Living   1 1 1  0 0 0 0 0 0 1     Review of Systems  Constitutional: Negative for fever, chills and activity change.  HENT: Negative for drooling and trouble swallowing.   Cardiovascular: Negative for chest pain.  Gastrointestinal: Negative for nausea.      Allergies  Review of patient's allergies indicates no known allergies.  Home Medications   Current Outpatient Rx  Name  Route  Sig  Dispense  Refill  . amoxicillin (AMOXIL) 500 MG capsule   Oral   Take 1 capsule (500 mg total) by mouth 3 (three) times daily.   21 capsule   0   . ibuprofen  (ADVIL,MOTRIN) 600 MG tablet   Oral   Take 1 tablet (600 mg total) by mouth every 6 (six) hours.   30 tablet   1   . norethindrone (ORTHO MICRONOR) 0.35 MG tablet   Oral   Take 1 tablet (0.35 mg total) by mouth daily. Start no sooner than 3 weeks from now.   1 Package   11   . Prenatal Vit-Fe Fumarate-FA (PRENATAL MULTIVITAMIN) TABS   Oral   Take 1 tablet by mouth at bedtime.         . senna-docusate (SENOKOT-S) 8.6-50 MG per tablet   Oral   Take 2 tablets by mouth at bedtime.   30 tablet   1    BP 126/60  Pulse 83  Temp(Src) 98.1 F (36.7 C) (Oral)  Resp 22  Ht 5\' 9"  (1.753 m)  Wt 167 lb (75.751 kg)  BMI 24.65 kg/m2  SpO2 98%  LMP 11/15/2013  Breastfeeding? No Physical Exam  Nursing note and vitals reviewed. Constitutional: She appears well-developed.  HENT:  Head: Atraumatic.  Neck: Neck supple.  Cardiovascular: Normal rate.   Pulmonary/Chest: Breath sounds normal.  Abdominal: Soft.    ED Course  Procedures (including critical care time)  Labs Review Labs Reviewed - No data to display Imaging Review Dg Neck Soft Tissue  11/22/2013   CLINICAL DATA:  Swallowed fish bone; left-sided throat pain.  EXAM: NECK SOFT TISSUES - 1+ VIEW  COMPARISON:  None.  FINDINGS: No radiopaque foreign body is identified. Apparent linear density above the hyoid is thought to reflect normal cartilage calcification. Note that many types of fish bone are not visible on radiograph.  Prevertebral soft tissues are unremarkable in appearance. The nasopharynx, oropharynx and hypopharynx are unremarkable. The epiglottis is normal in thickness. The aryepiglottic folds are unremarkable. The proximal trachea is within normal limits.  No acute osseous abnormalities are seen. The visualized lung apices are clear. The visualized paranasal sinuses and mastoid air cells are well aerated.  IMPRESSION: No radiopaque foreign bodies seen. Apparent linear density above the hyoid is thought to reflect normal  cartilage calcification. Note that many types of fish bone are not visible on radiograph.   Electronically Signed   By: Roanna Raider M.D.   On: 11/22/2013 23:59     EKG Interpretation None      MDM   Final diagnoses:  Food impaction of esophagus    Pt comes in concerned that she had a fish bone stuck to her throat. Xray is normal, gross exam is normal. Pt given nitro and glucagon - and subsequently, on reassessment, she stated that she no longer has the sharp pain and thinks that the foreign body passed.  Return precautions discussed. GI f/u provided x 2 weeks.  Derwood Kaplan, MD 11/23/13 [phone]

## 2014-03-30 ENCOUNTER — Emergency Department (HOSPITAL_BASED_OUTPATIENT_CLINIC_OR_DEPARTMENT_OTHER)
Admission: EM | Admit: 2014-03-30 | Discharge: 2014-03-30 | Disposition: A | Discharge status: Home / Self Care | Payer: BC Managed Care – PPO | Attending: Emergency Medicine | Admitting: Emergency Medicine

## 2014-03-30 ENCOUNTER — Encounter (HOSPITAL_BASED_OUTPATIENT_CLINIC_OR_DEPARTMENT_OTHER): Payer: Self-pay | Admitting: Emergency Medicine

## 2014-03-30 DIAGNOSIS — Z79899 Other long term (current) drug therapy: Secondary | ICD-10-CM | POA: Insufficient documentation

## 2014-03-30 DIAGNOSIS — J029 Acute pharyngitis, unspecified: Secondary | ICD-10-CM | POA: Insufficient documentation

## 2014-03-30 DIAGNOSIS — R0602 Shortness of breath: Secondary | ICD-10-CM | POA: Insufficient documentation

## 2014-03-30 DIAGNOSIS — Z87440 Personal history of urinary (tract) infections: Secondary | ICD-10-CM | POA: Insufficient documentation

## 2014-03-30 DIAGNOSIS — Z8619 Personal history of other infectious and parasitic diseases: Secondary | ICD-10-CM | POA: Insufficient documentation

## 2014-03-30 LAB — RAPID STREP SCREEN (MED CTR MEBANE ONLY): Streptococcus, Group A Screen (Direct): NEGATIVE

## 2014-03-30 MED ORDER — DEXAMETHASONE 4 MG PO TABS
ORAL_TABLET | ORAL | Status: AC
  Filled 2014-03-30: qty 3

## 2014-03-30 MED ORDER — DEXAMETHASONE 4 MG PO TABS
10.0000 mg | ORAL_TABLET | Freq: Once | ORAL | Status: AC
  Administered 2014-03-30: 10 mg via ORAL

## 2014-03-30 MED ORDER — IBUPROFEN 800 MG PO TABS
800.0000 mg | ORAL_TABLET | Freq: Once | ORAL | Status: AC
  Administered 2014-03-30: 800 mg via ORAL
  Filled 2014-03-30: qty 1

## 2014-03-30 NOTE — ED Notes (Signed)
Patient here with sore throat that started last pm. Reports that she feels as if her glands are swollen on each side of neck. No cold symptoms, denies cough, denies fever. Throat slightly red on assessment

## 2014-03-30 NOTE — Discharge Instructions (Signed)

## 2014-03-30 NOTE — ED Provider Notes (Signed)
CSN: [id number]     Arrival date & time 03/30/14  1005 History   First MD Initiated Contact with Patient 03/30/14 1011     Chief Complaint  Patient presents with  . Sore Throat     (Consider location/radiation/quality/duration/timing/severity/associated sxs/prior Treatment) Patient is a 23 y.o. female presenting with pharyngitis. The history is provided by the patient.  Sore Throat This is a new problem. The current episode started yesterday. The problem occurs constantly. The problem has not changed since onset.Associated symptoms include shortness of breath (mild). Pertinent negatives include no chest pain, no abdominal pain and no headaches. Nothing aggravates the symptoms. Nothing relieves the symptoms. She has tried nothing for the symptoms.    Past Medical History  Diagnosis Date  . Seasonal allergies   . Urinary tract infection   . Hx of varicella   . Chlamydia    Past Surgical History  Procedure Laterality Date  . No past surgeries     Family History  Problem Relation Age of Onset  . Other Neg Hx   . Hypertension Father   . Hyperlipidemia Father   . Heart disease Father     enlatged heart  . Sarcoidosis Father   . Cancer Maternal Grandfather     lung  . Anemia Mother   . Hypertension Sister    History  Substance Use Topics  . Smoking status: Never Smoker   . Smokeless tobacco: Never Used  . Alcohol Use: No   OB History   Grav Para Term Preterm Abortions TAB SAB Ect Mult Living   1 1 1  0 0 0 0 0 0 1     Review of Systems  Constitutional: Negative for fever and chills.  HENT: Positive for sore throat and trouble swallowing (due to pain).   Respiratory: Positive for shortness of breath (mild).   Cardiovascular: Negative for chest pain.  Gastrointestinal: Negative for abdominal pain.  Neurological: Negative for headaches.  All other systems reviewed and are negative.     Allergies  Review of patient's allergies indicates no known allergies.  Home  Medications   Prior to Admission medications   Medication Sig Start Date End Date Taking? Authorizing Provider  norethindrone (ORTHO MICRONOR) 0.35 MG tablet Take 1 tablet (0.35 mg total) by mouth daily. Start no sooner than 3 weeks from now. 01/21/13   Napoleon Form, MD   BP 127/79  Pulse 71  Temp(Src) 97.9 F (36.6 C) (Oral)  Resp 18  Wt 170 lb (77.111 kg)  SpO2 100% Physical Exam  Nursing note and vitals reviewed. Constitutional: She is oriented to person, place, and time. She appears well-developed and well-nourished. No distress.  HENT:  Head: Normocephalic and atraumatic.  Mouth/Throat: Mucous membranes are not pale and not dry. No trismus in the jaw. Posterior oropharyngeal edema (mild uvular edema) and posterior oropharyngeal erythema present. No oropharyngeal exudate.  Eyes: EOM are normal. Pupils are equal, round, and reactive to light.  Neck: Normal range of motion. Neck supple. No tracheal deviation present.  Cardiovascular: Normal rate and regular rhythm.  Exam reveals no friction rub.   No murmur heard. Pulmonary/Chest: Effort normal and breath sounds normal. No respiratory distress. She has no wheezes. She has no rales.  Abdominal: Soft. She exhibits no distension. There is no tenderness. There is no rebound.  Musculoskeletal: Normal range of motion. She exhibits no edema.  Lymphadenopathy:    She has cervical adenopathy (bilateral submandibular).  Neurological: She is alert and oriented to person, place, and  time.  Skin: She is not diaphoretic.    ED Course  Procedures (including critical care time) Labs Review Labs Reviewed  RAPID STREP SCREEN  CULTURE, GROUP A STREP    Imaging Review No results found.   EKG Interpretation None      MDM   Final diagnoses:  Pharyngitis    8F here with sore throat. Began last night. No fevers. On exam, vitals stable. No stridor, speaking in full sentences. No respiratory distress. Mild submandibular lymphadenopathy  on bilaterally that is tender. Mild uvular swelling and redness. Tonsils red. No swelling, neck supple. Exam c/w pharyngitis, will swab for strep. Decadron and motrin given. Negative strep. Stable for discharge.  Elwin MochaBlair Cherisse Carrell, MD 03/30/14 [phone]

## 2014-04-01 LAB — CULTURE, GROUP A STREP

## 2014-06-23 ENCOUNTER — Encounter (HOSPITAL_BASED_OUTPATIENT_CLINIC_OR_DEPARTMENT_OTHER): Payer: Self-pay | Admitting: Emergency Medicine

## 2015-02-04 ENCOUNTER — Ambulatory Visit: Payer: Medicaid Other | Admitting: Physician Assistant

## 2015-02-06 ENCOUNTER — Telehealth: Payer: Self-pay | Admitting: General Practice

## 2015-02-06 ENCOUNTER — Encounter: Payer: Self-pay | Admitting: Physician Assistant

## 2015-02-06 NOTE — Telephone Encounter (Signed)
Charge. 

## 2015-02-06 NOTE — Telephone Encounter (Signed)
No show, new pt appt, 02/04/15 9:30am. Charge?

## 2015-06-01 ENCOUNTER — Telehealth: Payer: Self-pay | Admitting: Medical

## 2015-06-01 ENCOUNTER — Telehealth: Payer: Self-pay | Admitting: Obstetrics & Gynecology

## 2015-06-01 ENCOUNTER — Encounter: Payer: Self-pay | Admitting: Medical

## 2015-06-01 ENCOUNTER — Ambulatory Visit (INDEPENDENT_AMBULATORY_CARE_PROVIDER_SITE_OTHER): Payer: BLUE CROSS/BLUE SHIELD | Admitting: Medical

## 2015-06-01 VITALS — BP 122/76 | HR 81 | Temp 98.2°F | Ht 67.5 in | Wt 178.8 lb

## 2015-06-01 DIAGNOSIS — K219 Gastro-esophageal reflux disease without esophagitis: Secondary | ICD-10-CM

## 2015-06-01 DIAGNOSIS — Z87440 Personal history of urinary (tract) infections: Secondary | ICD-10-CM | POA: Diagnosis not present

## 2015-06-01 DIAGNOSIS — Z3201 Encounter for pregnancy test, result positive: Secondary | ICD-10-CM

## 2015-06-01 DIAGNOSIS — N912 Amenorrhea, unspecified: Secondary | ICD-10-CM | POA: Diagnosis not present

## 2015-06-01 DIAGNOSIS — Z3490 Encounter for supervision of normal pregnancy, unspecified, unspecified trimester: Secondary | ICD-10-CM

## 2015-06-01 LAB — POCT URINE PREGNANCY: Preg Test, Ur: POSITIVE — AB

## 2015-06-01 MED ORDER — PRENATAL 1 30-0.975-200 MG PO CAPS: 1.0000 | ORAL_CAPSULE | Freq: Once | ORAL | Status: DC

## 2015-06-01 NOTE — Telephone Encounter (Signed)
Caller name: Tobi Bastos ([facility])   Can be reached: [phone]   Pharmacy: [id number] - [place], [place] - [address] AT [address]OSS FROM [address]  Reason for call: She need clarity on the Prenatal MV-Min-Fe Fum-FA  Rx that was sent in.

## 2015-06-01 NOTE — Progress Notes (Signed)
Pre visit review using our clinic review tool, if applicable. No additional management support is needed unless otherwise documented below in the visit note. 

## 2015-06-01 NOTE — Patient Instructions (Addendum)
Congratulations on pregnancy. I sent in prental vitamins to your pharmacy. Your urine looked clear and you lack any uti symptom so no culture being done.  For your recent reflux. Gerd diet guidline and follow advise of OB.  I put in referral to OB.   If any complications or symptoms contact their(OB) office. Follow up here as needed.

## 2015-06-01 NOTE — Telephone Encounter (Signed)
Spoke with [facility] at [facility] and she states that the Rx does not show up as a pre-natal 1 in the system. Per ES verbal ok for just plain prenatal vitamin.

## 2015-06-01 NOTE — Progress Notes (Signed)
Subjective:    Patient ID: Gail Perez, female    DOB: 28-Aug-1990, 24 y.o.   MRN: [id number]  HPI  I have reviewed pt PMH, PSH, FH, Social History and Surgical History.  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 otc claritin or allergra D use. No flare now.  UTI- when she was pregnant in 2014. Had one uti.Very early on had one uti.  Was not resistant to antibiotic.Her child was born full term. No current uti symptoms.  Hx of chicken pox-when younger. When she was young.  Pt states history of chlamydia. Pt was treated before the she got pregnant..  Pt lmp-??, Pt had irregular cycle in August. She went to [facility] Sept 1st .Pregnancy test was negative. Pt gyn./ob Dr. Langston Masker.  Pt is married. She was on birth control but had been forgetting to take in summer. She state was going to discuss contareceptive options with gyn on next visit.    Review of Systems  Constitutional: Negative for fever, chills, diaphoresis, activity change and fatigue.  Respiratory: Negative for cough, chest tightness and shortness of breath.   Cardiovascular: Negative for chest pain, palpitations and leg swelling.  Gastrointestinal: Negative for nausea, vomiting and abdominal pain.       Mild reflux past 2 wks.  Genitourinary: Negative for dysuria, urgency, frequency, flank pain, vaginal bleeding, vaginal pain, menstrual problem and pelvic pain.       No adenexal pain.  Musculoskeletal: Negative for back pain, neck pain and neck stiffness.  Neurological: Negative for dizziness, syncope, weakness and headaches.  Hematological: Negative for adenopathy. Does not bruise/bleed easily.  Psychiatric/Behavioral: Negative for behavioral problems, confusion and agitation. The patient is not nervous/anxious.      Past Medical History  Diagnosis Date  . Seasonal allergies   . Urinary tract  infection   . Hx of varicella   . Chlamydia   . Allergy     Social History   Social History  . Marital Status: Single    Spouse Name: N/A  . Number of Children: N/A  . Years of Education: N/A   Occupational History  . Not on file.   Social History Main Topics  . Smoking status: Never Smoker   . Smokeless tobacco: Never Used  . Alcohol Use: No  . Drug Use: No  . Sexual Activity: Yes    Birth Control/ Protection: None   Other Topics Concern  . Not on file   Social History Narrative    Past Surgical History  Procedure Laterality Date  . No past surgeries      Family History  Problem Relation Age of Onset  . Other Neg Hx   . Hypertension Father   . Hyperlipidemia Father   . Heart disease Father     enlatged heart  . Sarcoidosis Father   . Cancer Maternal Grandfather     lung  . Anemia Mother   . Hypertension Sister     No Known Allergies  No current outpatient prescriptions on file prior to visit.   No current facility-administered medications on file prior to visit.    BP 122/76 mmHg  Pulse 81  Temp(Src) 98.2 F (36.8 C) (Oral)  Ht 5' 7.5" (1.715 m)  Wt 178 lb 12.8 oz (81.103 kg)  BMI 27.57 kg/m2  SpO2 98%  LMP 04/23/2015      Objective:  Physical Exam  General Appearance- Not in acute distress.  HEENT Eyes- Scleraeral/Conjuntiva-bilat- Not Yellow. Mouth & Throat- Normal.  Chest and Lung Exam Auscultation: Breath sounds:-Normal. Adventitious sounds:- No Adventitious sounds.  Cardiovascular Auscultation:Rythm - Regular. Heart Sounds -Normal heart sounds.  Abdomen Inspection:-Inspection Normal.  Palpation/Perucssion: Palpation and Percussion of the abdomen reveal- Non  supraptubicTender and no epigastric , No Rebound tenderness, No rigidity(Guarding) and No Palpable abdominal masses.  Liver:-Normal.  Spleen:- Normal.   Back- no cva tenderness.       Assessment & Plan:  Congratulations on pregnancy. I sent in prental  vitamins to your pharmacy. Your urine looked clear and you lack any uti symptom so no culture being done.  For your recent reflux. Gerd diet guidline and follow advise of OB.  I put in referral to OB.   If any complications or symptoms contact their(OB) office. Follow up here as needed.

## 2015-06-01 NOTE — Telephone Encounter (Signed)
Error

## 2015-07-02 LAB — OB RESULTS CONSOLE HEPATITIS B SURFACE ANTIGEN: HEP B S AG: NEGATIVE

## 2015-07-02 LAB — OB RESULTS CONSOLE RUBELLA ANTIBODY, IGM: Rubella: IMMUNE

## 2015-08-23 NOTE — L&D Delivery Note (Addendum)
Patient is 25 y.o. G2P1001 537w1d admitted for PDIOL   Delivery Note At 5:25 AM a viable female was delivered via Vaginal, Spontaneous Delivery (Presentation: ; Occiput Anterior).  APGAR: 8, 9; weight  .   Placenta status: Intact, Spontaneous.  Cord: 3 vessels with the following complications: None.  Cord pH: not collected  Anesthesia: Epidural  Episiotomy: None Lacerations: 1st degree;Sulcus Suture Repair: 3.0 Est. Blood Loss (mL): 150  Mom to postpartum.  Baby to Couplet care / Skin to Skin.  Almon Herculesaye T Gonfa 02/16/2016, 6:16 AM  OB fellow attestation: Patient is a G2P1001 at 7537w1d who was admitted for IOL for Postdates induction and gestational hypertension notable on admission. uncomplicated prenatal course.  She progressed with augmentation via cytotec and pitocin.  I was gloved and present for delivery in its entirety.  Second stage of labor progressed  variable decels during second stage noted with nadir of 70s with return to baseline.   Complications: none  Lacerations: sulcal, left  EBL: 150  Federico FlakeKimberly Niles Felis Quillin, MD 6:18 AM

## 2015-11-06 ENCOUNTER — Encounter: Payer: Self-pay | Admitting: *Deleted

## 2015-11-22 ENCOUNTER — Encounter: Payer: Self-pay | Admitting: Advanced Practice Midwife

## 2015-11-22 DIAGNOSIS — Z8759 Personal history of other complications of pregnancy, childbirth and the puerperium: Secondary | ICD-10-CM | POA: Insufficient documentation

## 2015-11-23 ENCOUNTER — Encounter: Payer: Self-pay | Admitting: Advanced Practice Midwife

## 2015-11-23 ENCOUNTER — Ambulatory Visit (INDEPENDENT_AMBULATORY_CARE_PROVIDER_SITE_OTHER): Payer: Self-pay | Admitting: Advanced Practice Midwife

## 2015-11-23 VITALS — BP 120/66 | HR 82 | Wt 206.7 lb

## 2015-11-23 DIAGNOSIS — Z3493 Encounter for supervision of normal pregnancy, unspecified, third trimester: Secondary | ICD-10-CM

## 2015-11-23 DIAGNOSIS — B3731 Acute candidiasis of vulva and vagina: Secondary | ICD-10-CM

## 2015-11-23 DIAGNOSIS — Z3483 Encounter for supervision of other normal pregnancy, third trimester: Secondary | ICD-10-CM

## 2015-11-23 DIAGNOSIS — Z8759 Personal history of other complications of pregnancy, childbirth and the puerperium: Secondary | ICD-10-CM

## 2015-11-23 DIAGNOSIS — B373 Candidiasis of vulva and vagina: Secondary | ICD-10-CM

## 2015-11-23 DIAGNOSIS — O98813 Other maternal infectious and parasitic diseases complicating pregnancy, third trimester: Secondary | ICD-10-CM

## 2015-11-23 DIAGNOSIS — Z9889 Other specified postprocedural states: Secondary | ICD-10-CM

## 2015-11-23 DIAGNOSIS — O283 Abnormal ultrasonic finding on antenatal screening of mother: Secondary | ICD-10-CM | POA: Insufficient documentation

## 2015-11-23 LAB — POCT URINALYSIS DIP (DEVICE)
BILIRUBIN URINE: NEGATIVE
Glucose, UA: NEGATIVE mg/dL
HGB URINE DIPSTICK: NEGATIVE
KETONES UR: NEGATIVE mg/dL
Nitrite: NEGATIVE
PH: 7 (ref 5.0–8.0)
Protein, ur: 30 mg/dL — AB
Urobilinogen, UA: 1 mg/dL (ref 0.0–1.0)

## 2015-11-23 LAB — CBC
HEMATOCRIT: 34 % — AB (ref 35.0–45.0)
HEMOGLOBIN: 11.5 g/dL — AB (ref 11.7–15.5)
MCH: 28.1 pg (ref 27.0–33.0)
MCHC: 33.8 g/dL (ref 32.0–36.0)
MCV: 83.1 fL (ref 80.0–100.0)
MPV: 9.9 fL (ref 7.5–12.5)
Platelets: 320 10*3/uL (ref 140–400)
RBC: 4.09 MIL/uL (ref 3.80–5.10)
RDW: 13.9 % (ref 11.0–15.0)
WBC: 10.4 10*3/uL (ref 3.8–10.8)

## 2015-11-23 MED ORDER — TERCONAZOLE 0.4 % VA CREA: 1.0000 | TOPICAL_CREAM | Freq: Every day | VAGINAL | Status: DC

## 2015-11-23 NOTE — Progress Notes (Signed)
New OB transfer     See SmartSet  Subjective:    Gail Perez is a G2P1001 [redacted]w[redacted]d being seen today for her first obstetrical visit.  She is transferring here from [facility] due to insurance change.  She does want to do waterbirth, however, so she was pleased that we can offer that to her (see note below)   Her obstetrical history is significant for pregnancy induced hypertension. Patient does intend to breast feed. Pregnancy history fully reviewed.  Patient reports vaginal irritation.  Filed Vitals:   11/23/15 1457  BP: 120/66  Pulse: 82  Weight: 206 lb 11.2 oz (93.759 kg)    HISTORY: OB History  Gravida Para Term Preterm AB SAB TAB Ectopic Multiple Living  0 0 0 0 0 0 1    # Outcome Date GA Lbr Len/2nd Weight Sex Delivery Anes PTL Lv  2 Current           1 Term 12/30/12 [redacted]w[redacted]d / 01:15 6 lb 11.8 oz (3.055 kg) M Vag-Spont EPI  Y     Past Medical History  Diagnosis Date  . Seasonal allergies   . Urinary tract infection   . Hx of varicella   . Chlamydia   . Allergy   . Vaginal Pap smear, abnormal   . Pregnancy induced hypertension 2014   Past Surgical History  Procedure Laterality Date  . Leep     Family History  Problem Relation Age of Onset  . Other Neg Hx   . Hypertension Father   . Hyperlipidemia Father   . Heart disease Father     enlatged heart  . Sarcoidosis Father   . Cancer Maternal Grandfather     lung  . Anemia Mother   . Hypertension Sister      Exam    Uterus:  Fundal Height: 29 cm  Pelvic Exam:    Perineum: normal   Vulva: Bartholin's, Urethra, Skene's normal   Vagina:  normal discharge   pH:    Cervix: multiparous appearance   Adnexa: no mass, fullness, tenderness   Bony Pelvis: gynecoid  System: Breast:  normal appearance, no masses or tenderness   Skin: normal coloration and turgor, no rashes    Neurologic: oriented, grossly non-focal   Extremities: normal strength, tone, and muscle mass   HEENT neck supple with  midline trachea   Mouth/Teeth mucous membranes moist, pharynx normal without lesions   Neck supple and no masses   Cardiovascular: regular rate and rhythm   Respiratory:  appears well, vitals normal, no respiratory distress, acyanotic, normal RR, ear and throat exam is normal, neck free of mass or lymphadenopathy, chest clear, no wheezing, crepitations, rhonchi, normal symmetric air entry   Abdomen: soft, non-tender; bowel sounds normal; no masses,  no organomegaly   Urinary: urethral meatus normal      Assessment:    Pregnancy: G2P1001 Patient Active Problem List   Diagnosis Date Noted  . Echogenic intracardiac focus of fetus on prenatal ultrasound 11/23/2015  . H/O LEEP 11/23/2015  . History of pregnancy induced hypertension 11/22/2015        Plan:     Initial labs drawn. Prenatal vitamins. Problem list reviewed and updated. Genetic Screening discussed Quad Screen: unsure if done.  Ultrasound discussed; fetal survey: requested. Follow up EIF  Follow up in 2 weeks. 50% of 30 min visit spent on counseling and coordination of care.   Wet prepsent Rx Terazol 7 Wants to try [employer]. Papers given  for consents, including list of exclusions. Discussed if she develops hypertension, she cannot do waterbirth. Will need to do class and get supplies.  List of where to call for class and referred to [facility] (asked).    [facility] Surgery CenterWILLIAMS,Husayn Reim 11/23/2015

## 2015-11-23 NOTE — Progress Notes (Signed)
Transfer of care from [facility] due to insurance. Pt reports vaginal itching.  Breastfeeding tip of the week reviewed.

## 2015-11-23 NOTE — Patient Instructions (Signed)

## 2015-11-24 ENCOUNTER — Encounter (HOSPITAL_COMMUNITY): Payer: Self-pay

## 2015-11-24 LAB — WET PREP, GENITAL: TRICH WET PREP: NONE SEEN

## 2015-11-24 LAB — GLUCOSE TOLERANCE, 1 HOUR (50G) W/O FASTING: Glucose, 1 Hr, gestational: 68 mg/dL (ref <140)

## 2015-11-24 LAB — HIV ANTIBODY (ROUTINE TESTING W REFLEX): HIV: NONREACTIVE

## 2015-11-25 LAB — RPR

## 2015-11-27 ENCOUNTER — Other Ambulatory Visit: Payer: Self-pay | Admitting: Advanced Practice Midwife

## 2015-11-27 ENCOUNTER — Ambulatory Visit (HOSPITAL_COMMUNITY)
Admission: RE | Admit: 2015-11-27 | Discharge: 2015-11-27 | Disposition: A | Discharge status: Home / Self Care | Payer: BLUE CROSS/BLUE SHIELD | Source: Ambulatory Visit | Attending: Advanced Practice Midwife | Admitting: Advanced Practice Midwife

## 2015-11-27 DIAGNOSIS — Z3A29 29 weeks gestation of pregnancy: Secondary | ICD-10-CM

## 2015-11-27 DIAGNOSIS — O283 Abnormal ultrasonic finding on antenatal screening of mother: Secondary | ICD-10-CM

## 2015-11-27 DIAGNOSIS — Z36 Encounter for antenatal screening of mother: Secondary | ICD-10-CM | POA: Insufficient documentation

## 2015-11-27 DIAGNOSIS — O358XX0 Maternal care for other (suspected) fetal abnormality and damage, not applicable or unspecified: Secondary | ICD-10-CM | POA: Diagnosis present

## 2015-11-27 DIAGNOSIS — Z8759 Personal history of other complications of pregnancy, childbirth and the puerperium: Secondary | ICD-10-CM

## 2015-11-27 DIAGNOSIS — Z3493 Encounter for supervision of normal pregnancy, unspecified, third trimester: Secondary | ICD-10-CM

## 2015-11-30 ENCOUNTER — Other Ambulatory Visit: Payer: Self-pay | Admitting: Advanced Practice Midwife

## 2015-11-30 DIAGNOSIS — O283 Abnormal ultrasonic finding on antenatal screening of mother: Secondary | ICD-10-CM

## 2015-11-30 DIAGNOSIS — Z3A29 29 weeks gestation of pregnancy: Secondary | ICD-10-CM

## 2015-12-08 ENCOUNTER — Ambulatory Visit (INDEPENDENT_AMBULATORY_CARE_PROVIDER_SITE_OTHER): Payer: BLUE CROSS/BLUE SHIELD | Admitting: Certified Nurse Midwife

## 2015-12-08 VITALS — BP 124/72 | HR 81 | Temp 98.6°F | Wt 210.4 lb

## 2015-12-08 DIAGNOSIS — Z23 Encounter for immunization: Secondary | ICD-10-CM

## 2015-12-08 DIAGNOSIS — Z3483 Encounter for supervision of other normal pregnancy, third trimester: Secondary | ICD-10-CM | POA: Diagnosis not present

## 2015-12-08 DIAGNOSIS — Z8759 Personal history of other complications of pregnancy, childbirth and the puerperium: Secondary | ICD-10-CM

## 2015-12-08 LAB — POCT URINALYSIS DIP (DEVICE)
Bilirubin Urine: NEGATIVE
Glucose, UA: NEGATIVE mg/dL
Hgb urine dipstick: NEGATIVE
Ketones, ur: NEGATIVE mg/dL
Leukocytes, UA: NEGATIVE
Nitrite: NEGATIVE
Protein, ur: NEGATIVE mg/dL
Specific Gravity, Urine: 1.02 (ref 1.005–1.030)
Urobilinogen, UA: 0.2 mg/dL (ref 0.0–1.0)
pH: 7 (ref 5.0–8.0)

## 2015-12-08 MED ORDER — TETANUS-DIPHTH-ACELL PERTUSSIS 5-2.5-18.5 LF-MCG/0.5 IM SUSP
0.5000 mL | Freq: Once | INTRAMUSCULAR | Status: AC
  Administered 2015-12-08: 0.5 mL via INTRAMUSCULAR

## 2015-12-08 NOTE — Patient Instructions (Signed)

## 2015-12-08 NOTE — Progress Notes (Signed)
Declines flu shot

## 2015-12-08 NOTE — Progress Notes (Signed)
Subjective:  Gail Perez is a 25 y.o. G2P1001 at 3844w1d being seen today for ongoing prenatal care.  She is currently monitored for the following issues for this high-risk pregnancy and has History of pregnancy induced hypertension; Echogenic intracardiac focus of fetus on prenatal ultrasound; and H/O LEEP on her problem list.  Patient reports no complaints.  Contractions: Not present. Vag. Bleeding: None.  Movement: Present. Denies leaking of fluid.   The following portions of the patient's history were reviewed and updated as appropriate: allergies, current medications, past family history, past medical history, past social history, past surgical history and problem list. Problem list updated.  Objective:   Filed Vitals:   12/08/15 0806  BP: 124/72  Pulse: 81  Temp: 98.6 F (37 C)  Weight: 210 lb 6.4 oz (95.437 kg)    Fetal Status: Fetal Heart Rate (bpm): 145 Fundal Height: 30 cm Movement: Present     General:  Alert, oriented and cooperative. Patient is in no acute distress.  Skin: Skin is warm and dry. No rash noted.   Cardiovascular: Normal heart rate noted  Respiratory: Normal respiratory effort, no problems with respiration noted  Abdomen: Soft, gravid, appropriate for gestational age. Pain/Pressure: Present     Pelvic: Vag. Bleeding: None     Cervical exam deferred        Extremities: Normal range of motion.  Edema: None  Mental Status: Normal mood and affect. Normal behavior. Normal judgment and thought content.   Urinalysis: Urine Protein: Negative Urine Glucose: Negative  Assessment and Plan:  Pregnancy: G2P1001 at 5844w1d  1. History of pregnancy induced hypertension  - Tdap (BOOSTRIX) injection 0.5 mL; Inject 0.5 mLs into the muscle once. - Flu Vaccine QUAD 36+ mos IM; Standing declined - Flu Vaccine QUAD 36+ mos IM  Preterm labor symptoms and general obstetric precautions including but not limited to vaginal bleeding, contractions, leaking of fluid and fetal  movement were reviewed in detail with the patient. Please refer to After Visit Summary for other counseling recommendations.  Return in about 2 weeks (around 12/22/2015).   Rhea PinkLori A Clemmons, CNM

## 2015-12-22 ENCOUNTER — Ambulatory Visit (INDEPENDENT_AMBULATORY_CARE_PROVIDER_SITE_OTHER): Payer: BLUE CROSS/BLUE SHIELD | Admitting: Family

## 2015-12-22 VITALS — BP 130/62 | HR 86 | Wt 218.0 lb

## 2015-12-22 DIAGNOSIS — Z8759 Personal history of other complications of pregnancy, childbirth and the puerperium: Secondary | ICD-10-CM

## 2015-12-22 DIAGNOSIS — Z3483 Encounter for supervision of other normal pregnancy, third trimester: Secondary | ICD-10-CM

## 2015-12-22 LAB — POCT URINALYSIS DIP (DEVICE)
Bilirubin Urine: NEGATIVE
Glucose, UA: NEGATIVE mg/dL
Ketones, ur: NEGATIVE mg/dL
Leukocytes, UA: NEGATIVE
Nitrite: NEGATIVE
PH: 6 (ref 5.0–8.0)
PROTEIN: NEGATIVE mg/dL
SPECIFIC GRAVITY, URINE: 1.025 (ref 1.005–1.030)
UROBILINOGEN UA: 0.2 mg/dL (ref 0.0–1.0)

## 2015-12-22 NOTE — Progress Notes (Signed)
Subjective:  Gail Perez is a 25 y.o. G2P1001 at 5710w1d being seen today for ongoing prenatal care.  She is currently monitored for the following issues for this low-risk pregnancy and has History of pregnancy induced hypertension; Echogenic intracardiac focus of fetus on prenatal ultrasound; and H/O LEEP on her problem list.  Patient reports no complaints.  Contractions: Not present. Vag. Bleeding: None.  Movement: Present. Denies leaking of fluid.   The following portions of the patient's history were reviewed and updated as appropriate: allergies, current medications, past family history, past medical history, past social history, past surgical history and problem list. Problem list updated.  Objective:   Filed Vitals:   12/22/15 0838  BP: 130/62  Pulse: 86  Weight: 98.884 kg (218 lb)    Fetal Status: Fetal Heart Rate (bpm): 145 Fundal Height: 34 cm Movement: Present     General:  Alert, oriented and cooperative. Patient is in no acute distress.  Skin: Skin is warm and dry. No rash noted.   Cardiovascular: Normal heart rate noted  Respiratory: Normal respiratory effort, no problems with respiration noted  Abdomen: Soft, gravid, appropriate for gestational age. Pain/Pressure: Present     Pelvic: Vag. Bleeding: None     Cervical exam deferred        Extremities: Normal range of motion.  Edema: None  Mental Status: Normal mood and affect. Normal behavior. Normal judgment and thought content.   Urinalysis:      Assessment and Plan:  Pregnancy: G2P1001 at 6110w1d  1. History of pregnancy induced hypertension - Continue baby ASA - Given waterbirth information; plans to attend waterbirth class  Preterm labor symptoms and general obstetric precautions including but not limited to vaginal bleeding, contractions, leaking of fluid and fetal movement were reviewed in detail with the patient. Please refer to After Visit Summary for other counseling recommendations.  Return in about 2 weeks  (around 01/05/2016).   Eino FarberWalidah Kennith GainN Karim, CNM

## 2015-12-22 NOTE — Patient Instructions (Signed)
Thinking About Doren Custard???  You must attend a Doren Custard class at [facility]  3rd Wednesday of every month from 7-9pm  Free  [employer] by calling [phone] or online at [email or web address]  Bring [place] the certificate from the class  Waterbirth supplies needed for [facility] Department patients:  Our practice has a Heritage manager in a Box tub at the hospital that you can borrow  You will need to purchase an accessory kit that has all needed supplies through [facility] [phone]) or online $175.00  Or you can purchase the supplies separately: o Single-use disposable tub liner for Birth Pool in a Box (REGULAR size) o New garden hose labeled "lead-free", "suitable for drinking water", o Electric drain pump to remove water (We recommend 792 gallon per hour or greater pump.)  o  "non-toxic" OR "water potable" o Garden hose to remove the dirty water o Fish net o Bathing suit top (optional) o Long-handled mirror (optional)  [employer] sells tubs for ~ $120 if you would rather purchase your own tub.  They also sell accessories, liners.    Www.waterbirthsolutions.com for tub purchases and supplies  [facility] (www.thelaborladies.com) $275 for tub rental/set-up & take down/kit   [employer] information regarding doulas (labor support) who provide pool rentals:  IdentityList.se.htm   [facility] (www.thelaborladies.com)  IdentityList.se.htm   Things that would prevent you from having a waterbirth:  Premature, <37wks  Previous cesarean birth  Presence of thick meconium-stained fluid  Multiple gestation (Twins, triplets, etc.)  Uncontrolled diabetes or gestational diabetes requiring medication  Hypertension  Heavy vaginal bleeding  Non-reassuring fetal heart rate  Active infection (MRSA, etc.)  If your labor has to be induced and induction  method requires continuous monitoring of the baby's heart rate  Other risks/issues identified by your obstetrical provider

## 2016-01-05 ENCOUNTER — Ambulatory Visit (INDEPENDENT_AMBULATORY_CARE_PROVIDER_SITE_OTHER): Payer: BLUE CROSS/BLUE SHIELD | Admitting: Student

## 2016-01-05 VITALS — BP 119/64 | HR 77 | Temp 97.8°F | Wt 215.6 lb

## 2016-01-05 DIAGNOSIS — Z3483 Encounter for supervision of other normal pregnancy, third trimester: Secondary | ICD-10-CM

## 2016-01-05 DIAGNOSIS — Z3493 Encounter for supervision of normal pregnancy, unspecified, third trimester: Secondary | ICD-10-CM

## 2016-01-05 LAB — POCT URINALYSIS DIP (DEVICE)
Bilirubin Urine: NEGATIVE
GLUCOSE, UA: NEGATIVE mg/dL
Hgb urine dipstick: NEGATIVE
Leukocytes, UA: NEGATIVE
NITRITE: NEGATIVE
PH: 6 (ref 5.0–8.0)
PROTEIN: NEGATIVE mg/dL
Specific Gravity, Urine: 1.025 (ref 1.005–1.030)
UROBILINOGEN UA: 1 mg/dL (ref 0.0–1.0)

## 2016-01-05 NOTE — Progress Notes (Signed)
Subjective:  Gail Perez is a 25 y.o. G2P1001 at 8124w1d being seen today for ongoing prenatal care.  She is currently monitored for the following issues for this low-risk pregnancy and has History of pregnancy induced hypertension; Echogenic intracardiac focus of fetus on prenatal ultrasound; and H/O LEEP on her problem list.  Patient reports no complaints.  Contractions: Irregular. Vag. Bleeding: None.  Movement: Present. Denies leaking of fluid.   The following portions of the patient's history were reviewed and updated as appropriate: allergies, current medications, past family history, past medical history, past social history, past surgical history and problem list. Problem list updated.  Objective:   Filed Vitals:   01/05/16 1443  BP: 119/64  Pulse: 77  Temp: 97.8 F (36.6 C)  Weight: 215 lb 9.6 oz (97.796 kg)    Fetal Status: Fetal Heart Rate (bpm): 120 Fundal Height: 36 cm Movement: Present     General:  Alert, oriented and cooperative. Patient is in no acute distress.  Skin: Skin is warm and dry. No rash noted.   Cardiovascular: Normal heart rate noted  Respiratory: Normal respiratory effort, no problems with respiration noted  Abdomen: Soft, gravid, appropriate for gestational age. Pain/Pressure: Present     Pelvic: Vag. Bleeding: None     Cervical exam deferred        Extremities: Normal range of motion.  Edema: Trace  Mental Status: Normal mood and affect. Normal behavior. Normal judgment and thought content.   Urinalysis: Urine Protein: Negative Urine Glucose: Negative  Assessment and Plan:  Pregnancy: G2P1001 at 8424w1d  1. Supervision of normal pregnancy in third trimester  - POCT urinalysis dip (device)  Preterm labor symptoms and general obstetric precautions including but not limited to vaginal bleeding, contractions, leaking of fluid and fetal movement were reviewed in detail with the patient. Please refer to After Visit Summary for other counseling  recommendations.  Return in about 2 weeks (around 01/19/2016).   Hurshel PartyLisa A Leftwich-Kirby, CNM

## 2016-01-12 ENCOUNTER — Ambulatory Visit (INDEPENDENT_AMBULATORY_CARE_PROVIDER_SITE_OTHER): Payer: BLUE CROSS/BLUE SHIELD | Admitting: Medical

## 2016-01-12 VITALS — BP 126/66 | HR 81 | Wt 218.0 lb

## 2016-01-12 DIAGNOSIS — Z8759 Personal history of other complications of pregnancy, childbirth and the puerperium: Secondary | ICD-10-CM

## 2016-01-12 DIAGNOSIS — Z113 Encounter for screening for infections with a predominantly sexual mode of transmission: Secondary | ICD-10-CM | POA: Diagnosis not present

## 2016-01-12 DIAGNOSIS — Z3483 Encounter for supervision of other normal pregnancy, third trimester: Secondary | ICD-10-CM | POA: Diagnosis not present

## 2016-01-12 LAB — OB RESULTS CONSOLE GC/CHLAMYDIA: GC PROBE AMP, GENITAL: NEGATIVE

## 2016-01-12 LAB — POCT URINALYSIS DIP (DEVICE)
BILIRUBIN URINE: NEGATIVE
Glucose, UA: NEGATIVE mg/dL
Hgb urine dipstick: NEGATIVE
KETONES UR: NEGATIVE mg/dL
Leukocytes, UA: NEGATIVE
Nitrite: NEGATIVE
PH: 6 (ref 5.0–8.0)
Protein, ur: NEGATIVE mg/dL
SPECIFIC GRAVITY, URINE: 1.02 (ref 1.005–1.030)
Urobilinogen, UA: 0.2 mg/dL (ref 0.0–1.0)

## 2016-01-12 LAB — OB RESULTS CONSOLE GBS: GBS: NEGATIVE

## 2016-01-12 NOTE — Progress Notes (Signed)
Breastfeeding tip reviewed 

## 2016-01-12 NOTE — Progress Notes (Signed)
Subjective:  Gail Perez is a 25 y.o. G2P1001 at 3058w1d being seen today for ongoing prenatal care.  She is currently monitored for the following issues for this low-risk pregnancy and has History of pregnancy induced hypertension; Echogenic intracardiac focus of fetus on prenatal ultrasound; and H/O LEEP on her problem list.  Patient reports no complaints.  Contractions: Irregular. Vag. Bleeding: None.  Movement: Present. Denies leaking of fluid.   The following portions of the patient's history were reviewed and updated as appropriate: allergies, current medications, past family history, past medical history, past social history, past surgical history and problem list. Problem list updated.  Objective:   Filed Vitals:   01/12/16 0839  BP: 126/66  Pulse: 81  Weight: 218 lb (98.884 kg)    Fetal Status: Fetal Heart Rate (bpm): 130 Fundal Height: 36 cm Movement: Present     General:  Alert, oriented and cooperative. Patient is in no acute distress.  Skin: Skin is warm and dry. No rash noted.   Cardiovascular: Normal heart rate noted  Respiratory: Normal respiratory effort, no problems with respiration noted  Abdomen: Soft, gravid, appropriate for gestational age. Pain/Pressure: Present     Pelvic: Vag. Bleeding: None     Cervical exam performed      0/50/ballotable  Extremities: Normal range of motion.  Edema: Trace  Mental Status: Normal mood and affect. Normal behavior. Normal judgment and thought content.   Urinalysis: Urine Protein: Negative Urine Glucose: Negative  Assessment and Plan:  Pregnancy: G2P1001 at 6058w1d  1. History of pregnancy induced hypertension - BP today - normal, only trace edema - Culture, beta strep (group b only) - [facility]lam[facility]  Preterm labor symptoms and general obstetric precautions including but not limited to vaginal bleeding, contractions, leaking of fluid and fetal movement were reviewed in detail with the  patient. Please refer to After Visit Summary for other counseling recommendations.  Return in about 1 week (around 01/19/2016) for LOB.   Marny LowensteinJulie N Valin Massie, PA-C

## 2016-01-12 NOTE — Patient Instructions (Signed)
Braxton Hicks Contractions °Contractions of the uterus can occur throughout pregnancy. Contractions are not always a sign that you are in labor.  °WHAT ARE BRAXTON HICKS CONTRACTIONS?  °Contractions that occur before labor are called Braxton Hicks contractions, or false labor. Toward the end of pregnancy (32-34 weeks), these contractions can develop more often and may become more forceful. This is not true labor because these contractions do not result in opening (dilatation) and thinning of the cervix. They are sometimes difficult to tell apart from true labor because these contractions can be forceful and people have different pain tolerances. You should not feel embarrassed if you go to the hospital with false labor. Sometimes, the only way to tell if you are in true labor is for your health care provider to look for changes in the cervix. °If there are no prenatal problems or other health problems associated with the pregnancy, it is completely safe to be sent home with false labor and await the onset of true labor. °HOW CAN YOU TELL THE DIFFERENCE BETWEEN TRUE AND FALSE LABOR? °False Labor °· The contractions of false labor are usually shorter and not as hard as those of true labor.   °· The contractions are usually irregular.   °· The contractions are often felt in the front of the lower abdomen and in the groin.   °· The contractions may go away when you walk around or change positions while lying down.   °· The contractions get weaker and are shorter lasting as time goes on.   °· The contractions do not usually become progressively stronger, regular, and closer together as with true labor.   °True Labor °· Contractions in true labor last 30-70 seconds, become very regular, usually become more intense, and increase in frequency.   °· The contractions do not go away with walking.   °· The discomfort is usually felt in the top of the uterus and spreads to the lower abdomen and low back.   °· True labor can be  determined by your health care provider with an exam. This will show that the cervix is dilating and getting thinner.   °WHAT TO REMEMBER °· Keep up with your usual exercises and follow other instructions given by your health care provider.   °· Take medicines as directed by your health care provider.   °· Keep your regular prenatal appointments.   °· Eat and drink lightly if you think you are going into labor.   °· If Braxton Hicks contractions are making you uncomfortable:   °¨ Change your position from lying down or resting to walking, or from walking to resting.   °¨ Sit and rest in a tub of warm water.   °¨ Drink 2-3 glasses of water. Dehydration may cause these contractions.   °¨ Do slow and deep breathing several times an hour.   °WHEN SHOULD I SEEK IMMEDIATE MEDICAL CARE? °Seek immediate medical care if: °· Your contractions become stronger, more regular, and closer together.   °· You have fluid leaking or gushing from your vagina.   °· You have a fever.   °· You pass blood-tinged mucus.   °· You have vaginal bleeding.   °· You have continuous abdominal pain.   °· You have low back pain that you never had before.   °· You feel your baby's head pushing down and causing pelvic pressure.   °· Your baby is not moving as much as it used to.   °  °This information is not intended to replace advice given to you by your health care provider. Make sure you discuss any questions you have with your health care   provider. °  °Document Released: 08/08/2005 Document Revised: 08/13/2013 Document Reviewed: 05/20/2013 °Elsevier Interactive Patient Education ©2016 [employer]vier Inc. ° °

## 2016-01-13 LAB — GC/CHLAMYDIA PROBE AMP (~~LOC~~) NOT AT ARMC
Chlamydia: NEGATIVE
Neisseria Gonorrhea: NEGATIVE

## 2016-01-14 LAB — CULTURE, BETA STREP (GROUP B ONLY)

## 2016-01-19 ENCOUNTER — Ambulatory Visit (INDEPENDENT_AMBULATORY_CARE_PROVIDER_SITE_OTHER): Payer: BLUE CROSS/BLUE SHIELD | Admitting: Student

## 2016-01-19 VITALS — BP 127/71 | HR 85 | Wt 218.0 lb

## 2016-01-19 DIAGNOSIS — Z3483 Encounter for supervision of other normal pregnancy, third trimester: Secondary | ICD-10-CM

## 2016-01-19 DIAGNOSIS — Z8759 Personal history of other complications of pregnancy, childbirth and the puerperium: Secondary | ICD-10-CM

## 2016-01-19 LAB — COMPREHENSIVE METABOLIC PANEL
ALBUMIN: 3.2 g/dL — AB (ref 3.6–5.1)
ALT: 7 U/L (ref 6–29)
AST: 12 U/L (ref 10–30)
Alkaline Phosphatase: 151 U/L — ABNORMAL HIGH (ref 33–115)
BUN: 5 mg/dL — ABNORMAL LOW (ref 7–25)
CALCIUM: 8.6 mg/dL (ref 8.6–10.2)
CHLORIDE: 106 mmol/L (ref 98–110)
CO2: 22 mmol/L (ref 20–31)
Creat: 0.7 mg/dL (ref 0.50–1.10)
GLUCOSE: 74 mg/dL (ref 65–99)
Potassium: 4.1 mmol/L (ref 3.5–5.3)
SODIUM: 135 mmol/L (ref 135–146)
Total Bilirubin: 0.5 mg/dL (ref 0.2–1.2)
Total Protein: 6.2 g/dL (ref 6.1–8.1)

## 2016-01-19 LAB — POCT URINALYSIS DIP (DEVICE)
Bilirubin Urine: NEGATIVE
Glucose, UA: NEGATIVE mg/dL
Hgb urine dipstick: NEGATIVE
Ketones, ur: NEGATIVE mg/dL
Leukocytes, UA: NEGATIVE
Nitrite: NEGATIVE
Protein, ur: NEGATIVE mg/dL
Specific Gravity, Urine: 1.02 (ref 1.005–1.030)
Urobilinogen, UA: 1 mg/dL (ref 0.0–1.0)
pH: 6 (ref 5.0–8.0)

## 2016-01-19 LAB — CBC
HEMATOCRIT: 32.7 % — AB (ref 35.0–45.0)
Hemoglobin: 11 g/dL — ABNORMAL LOW (ref 11.7–15.5)
MCH: 26.7 pg — AB (ref 27.0–33.0)
MCHC: 33.6 g/dL (ref 32.0–36.0)
MCV: 79.4 fL — ABNORMAL LOW (ref 80.0–100.0)
MPV: 10.1 fL (ref 7.5–12.5)
Platelets: 253 10*3/uL (ref 140–400)
RBC: 4.12 MIL/uL (ref 3.80–5.10)
RDW: 14.3 % (ref 11.0–15.0)
WBC: 10.2 10*3/uL (ref 3.8–10.8)

## 2016-01-19 NOTE — Progress Notes (Signed)
Subjective:  Gail Perez is a 25 y.o. G2P1001 at 43w1dbeing seen today for ongoing prenatal care.  She is currently monitored for the following issues for this high-risk pregnancy and has History of pregnancy induced hypertension; Echogenic intracardiac focus of fetus on prenatal ultrasound; and H/O LEEP on her problem list.  Patient reports occasional contractions.  Contractions: Irregular. Vag. Bleeding: None.  Movement: Present. Denies leaking of fluid.   The following portions of the patient's history were reviewed and updated as appropriate: allergies, current medications, past family history, past medical history, past social history, past surgical history and problem list. Problem list updated.  Objective:   Filed Vitals:   01/19/16 0927 01/19/16 0934  BP: 140/76 127/71  Pulse: 85   Weight: 218 lb (98.884 kg)     Fetal Status: Fetal Heart Rate (bpm): 123   Movement: Present     General:  Alert, oriented and cooperative. Patient is in no acute distress.  Skin: Skin is warm and dry. No rash noted.   Cardiovascular: Normal heart rate noted  Respiratory: Normal respiratory effort, no problems with respiration noted  Abdomen: Soft, gravid, appropriate for gestational age. Pain/Pressure: Present     Pelvic: Vag. Bleeding: None     Cervical exam deferred      Pt declined cervical exam  Extremities: Normal range of motion.  Edema: None  Mental Status: Normal mood and affect. Normal behavior. Normal judgment and thought content.   Urinalysis: Urine Protein: Negative Urine Glucose: Negative  Assessment and Plan:  Pregnancy: G2P1001 at 359w1d1. History of pregnancy induced hypertension  - CBC - Comp Met (CMET) - Protein / Creatinine Ratio, Urine  Term labor symptoms and general obstetric precautions including but not limited to vaginal bleeding, contractions, leaking of fluid and fetal movement were reviewed in detail with the patient. Please refer to After Visit Summary for  other counseling recommendations.  Return in about 1 week (around 01/26/2016) for Routine OB.   ErJorje GuildNP

## 2016-01-19 NOTE — Patient Instructions (Signed)

## 2016-01-20 ENCOUNTER — Encounter: Payer: Self-pay | Admitting: Advanced Practice Midwife

## 2016-01-20 LAB — PROTEIN / CREATININE RATIO, URINE
CREATININE, URINE: 168 mg/dL (ref 20–320)
PROTEIN CREATININE RATIO: 71 mg/g{creat} (ref 21–161)
TOTAL PROTEIN, URINE: 12 mg/dL (ref 5–24)

## 2016-01-26 ENCOUNTER — Ambulatory Visit (INDEPENDENT_AMBULATORY_CARE_PROVIDER_SITE_OTHER): Payer: BLUE CROSS/BLUE SHIELD | Admitting: Family

## 2016-01-26 VITALS — BP 139/77 | HR 98 | Temp 98.5°F | Wt 217.0 lb

## 2016-01-26 DIAGNOSIS — Z8759 Personal history of other complications of pregnancy, childbirth and the puerperium: Secondary | ICD-10-CM

## 2016-01-26 DIAGNOSIS — Z3483 Encounter for supervision of other normal pregnancy, third trimester: Secondary | ICD-10-CM | POA: Diagnosis not present

## 2016-01-26 LAB — POCT URINALYSIS DIP (DEVICE)
BILIRUBIN URINE: NEGATIVE
GLUCOSE, UA: NEGATIVE mg/dL
Hgb urine dipstick: NEGATIVE
KETONES UR: NEGATIVE mg/dL
Leukocytes, UA: NEGATIVE
NITRITE: NEGATIVE
Protein, ur: NEGATIVE mg/dL
Specific Gravity, Urine: 1.02 (ref 1.005–1.030)
Urobilinogen, UA: 0.2 mg/dL (ref 0.0–1.0)
pH: 6 (ref 5.0–8.0)

## 2016-01-26 NOTE — Progress Notes (Signed)
Subjective:  Gail Perez is a 25 y.o. G2P1001 at 8489w1d being seen today for ongoing prenatal care.  She is currently monitored for the following issues for this low-risk pregnancy and has History of pregnancy induced hypertension; Echogenic intracardiac focus of fetus on prenatal ultrasound; and H/O LEEP on her problem list.  Patient reports no complaints.  Contractions: Irregular. Vag. Bleeding: None.  Movement: Present. Denies leaking of fluid.   The following portions of the patient's history were reviewed and updated as appropriate: allergies, current medications, past family history, past medical history, past social history, past surgical history and problem list. Problem list updated.  Objective:   Filed Vitals:   01/26/16 0842  BP: 139/77  Pulse: 98  Temp: 98.5 F (36.9 C)  Weight: 217 lb (98.431 kg)    Fetal Status: Fetal Heart Rate (bpm): 132 Fundal Height: 38 cm Movement: Present  Presentation: Vertex  General:  Alert, oriented and cooperative. Patient is in no acute distress.  Skin: Skin is warm and dry. No rash noted.   Cardiovascular: Normal heart rate noted  Respiratory: Normal respiratory effort, no problems with respiration noted  Abdomen: Soft, gravid, appropriate for gestational age. Pain/Pressure: Present     Pelvic: Vag. Bleeding: None     Cervical exam deferred        Extremities: Normal range of motion.  Edema: Trace  Mental Status: Normal mood and affect. Normal behavior. Normal judgment and thought content.   Urinalysis: Urine Protein: Negative Urine Glucose: Negative  Assessment and Plan:  Pregnancy: G2P1001 at 3489w1d  1. History of pregnancy induced hypertension - Continue monitoring of blood pressure  Term labor symptoms and general obstetric precautions including but not limited to vaginal bleeding, contractions, leaking of fluid and fetal movement were reviewed in detail with the patient. Please refer to After Visit Summary for other counseling  recommendations.  Return in about 1 week (around 02/02/2016).   Eino FarberWalidah Kennith GainN Karim, CNM

## 2016-01-27 ENCOUNTER — Encounter (HOSPITAL_COMMUNITY): Payer: Self-pay | Admitting: *Deleted

## 2016-01-27 ENCOUNTER — Inpatient Hospital Stay (HOSPITAL_COMMUNITY)
Admission: AD | Admit: 2016-01-27 | Discharge: 2016-01-27 | Disposition: A | Discharge status: Home / Self Care | Payer: BLUE CROSS/BLUE SHIELD | Source: Ambulatory Visit | Attending: Obstetrics and Gynecology | Admitting: Obstetrics and Gynecology

## 2016-01-27 DIAGNOSIS — O283 Abnormal ultrasonic finding on antenatal screening of mother: Secondary | ICD-10-CM

## 2016-01-27 DIAGNOSIS — Z3493 Encounter for supervision of normal pregnancy, unspecified, third trimester: Secondary | ICD-10-CM | POA: Diagnosis not present

## 2016-01-27 LAB — POCT FERN TEST: POCT Fern Test: NEGATIVE

## 2016-01-27 NOTE — MAU Note (Signed)
Pt reports she felt a lot of pressure earlier and went to void and she is unsure if her water broke at that time. Denies pain.

## 2016-01-27 NOTE — Discharge Instructions (Signed)
Premature Rupture and Preterm Premature Rupture of Membranes °Premature rupture of membranes (PROM) is when the membranes (amniotic sac) break open before contractions or labor starts. Rupture of membranes is commonly referred to as your water breaking. If PROM occurs before 37 weeks of pregnancy, it is called preterm premature rupture of membranes (PPROM). The amniotic sac holds the fetus, keeps infection out, and performs other important functions. Having the amniotic sac rupture before 37 weeks of pregnancy can lead to serious problems and requires immediate attention by your health care provider. °CAUSES  °PROM near the end of the pregnancy may be caused by natural weakening of the membranes. PPROM is often due to an infection. Other factors that may be associated with PROM include: °· Stretching of the amniotic sac because of carrying multiples or having too much amniotic fluid. °· Trauma. °· Smoking during pregnancy. °· Poor nutrition. °· Previous preterm birth. °· Vaginal bleeding. °· Little to no prenatal care. °· Problems with the placenta, such as placenta previa or placental abruption. °RISKS OF PROM AND PPROM °· Delivering a premature baby. °· Getting a serious infection of the placental tissues (chorioamnionitis). °· Early detachment of the placenta from the uterus (placental abruption). °· Compression of the umbilical cord. °· Needing a cesarean birth. °· Developing a serious infection after delivery. °SIGNS OF PROM OR PPROM  °· A sudden gush or slow leaking of fluid from the vagina. °· Constant wet underwear. °Sometimes, women mistake the leaking or wetness for urine, especially if the leak is slow and not a gush of fluid. If there is constant leaking or your underwear continues to get wet, your membranes have likely ruptured. °WHAT TO DO IF YOU THINK YOUR MEMBRANES HAVE RUPTURED °Call your health care provider right away. You will need to go to the hospital to get checked immediately. °WHAT HAPPENS  IF YOU ARE DIAGNOSED WITH PROM OR PPROM? °Once you arrive at the hospital, you will have tests done. A cervical exam will be performed to check if the cervix has softened or started to open (dilate). If you are diagnosed with PROM, you may be induced within 24 hours if you are not having contractions. If you are diagnosed with PPROM and are not having contractions, you may be induced depending on your trimester.  °If you have PPROM, you: °· And your baby will be monitored closely for signs of infection or other complications. °· May be given an antibiotic medicine to lower the chances of an infection developing. °· May be given a steroid medicine to help mature the baby's lungs faster. °· May be given a medicine to stop preterm labor. °· May be ordered to be on bed rest at home or in the hospital. °· May be induced if complications arise for you or the baby. °Your treatment will depend on many factors, such as how far along you are, the development of the baby, and other complications that may arise. °  °This information is not intended to replace advice given to you by your health care provider. Make sure you discuss any questions you have with your health care provider. °  °Document Released: 08/08/2005 Document Revised: 05/29/2013 Document Reviewed: 11/27/2012 °Elsevier Interactive Patient Education ©2016 [employer] °Braxton Hicks Contractions °Contractions of the uterus can occur throughout pregnancy. Contractions are not always a sign that you are in labor.  °WHAT ARE BRAXTON HICKS CONTRACTIONS?  °Contractions that occur before labor are called Braxton Hicks contractions, or false labor. Toward the end of pregnancy (  32-34 weeks), these contractions can develop more often and may become more forceful. This is not true labor because these contractions do not result in opening (dilatation) and thinning of the cervix. They are sometimes difficult to tell apart from true labor because these contractions can be  forceful and people have different pain tolerances. You should not feel embarrassed if you go to the hospital with false labor. Sometimes, the only way to tell if you are in true labor is for your health care provider to look for changes in the cervix. If there are no prenatal problems or other health problems associated with the pregnancy, it is completely safe to be sent home with false labor and await the onset of true labor. HOW CAN YOU TELL THE DIFFERENCE BETWEEN TRUE AND FALSE LABOR? False Labor  The contractions of false labor are usually shorter and not as hard as those of true labor.   The contractions are usually irregular.   The contractions are often felt in the front of the lower abdomen and in the groin.   The contractions may go away when you walk around or change positions while lying down.   The contractions get weaker and are shorter lasting as time goes on.   The contractions do not usually become progressively stronger, regular, and closer together as with true labor.  True Labor  Contractions in true labor last 30-70 seconds, become very regular, usually become more intense, and increase in frequency.   The contractions do not go away with walking.   The discomfort is usually felt in the top of the uterus and spreads to the lower abdomen and low back.   True labor can be determined by your health care provider with an exam. This will show that the cervix is dilating and getting thinner.  WHAT TO REMEMBER  Keep up with your usual exercises and follow other instructions given by your health care provider.   Take medicines as directed by your health care provider.   Keep your regular prenatal appointments.   Eat and drink lightly if you think you are going into labor.   If Braxton Hicks contractions are making you uncomfortable:   Change your position from lying down or resting to walking, or from walking to resting.   Sit and rest in a tub of  warm water.   Drink 2-3 glasses of water. Dehydration may cause these contractions.   Do slow and deep breathing several times an hour.  WHEN SHOULD I SEEK IMMEDIATE MEDICAL CARE? Seek immediate medical care if:  Your contractions become stronger, more regular, and closer together.   You have fluid leaking or gushing from your vagina.   You have a fever.   You pass blood-tinged mucus.   You have vaginal bleeding.   You have continuous abdominal pain.   You have low back pain that you never had before.   You feel your baby's head pushing down and causing pelvic pressure.   Your baby is not moving as much as it used to.    This information is not intended to replace advice given to you by your health care provider. Make sure you discuss any questions you have with your health care provider.   Document Released: 08/08/2005 Document Revised: 08/13/2013 Document Reviewed: 05/20/2013 Elsevier Interactive Patient Education [employer]

## 2016-01-28 ENCOUNTER — Encounter: Payer: Self-pay | Admitting: Advanced Practice Midwife

## 2016-02-02 ENCOUNTER — Ambulatory Visit (INDEPENDENT_AMBULATORY_CARE_PROVIDER_SITE_OTHER): Payer: BLUE CROSS/BLUE SHIELD | Admitting: Medical

## 2016-02-02 VITALS — BP 135/81 | HR 79 | Wt 224.2 lb

## 2016-02-02 DIAGNOSIS — Z3483 Encounter for supervision of other normal pregnancy, third trimester: Secondary | ICD-10-CM

## 2016-02-02 DIAGNOSIS — Z3493 Encounter for supervision of normal pregnancy, unspecified, third trimester: Secondary | ICD-10-CM

## 2016-02-02 LAB — POCT URINALYSIS DIP (DEVICE)
Bilirubin Urine: NEGATIVE
GLUCOSE, UA: NEGATIVE mg/dL
Ketones, ur: NEGATIVE mg/dL
Leukocytes, UA: NEGATIVE
Nitrite: NEGATIVE
PH: 5.5 (ref 5.0–8.0)
PROTEIN: NEGATIVE mg/dL
SPECIFIC GRAVITY, URINE: 1.015 (ref 1.005–1.030)
UROBILINOGEN UA: 0.2 mg/dL (ref 0.0–1.0)

## 2016-02-02 NOTE — Progress Notes (Signed)
Educated pt on [place] 

## 2016-02-02 NOTE — Patient Instructions (Signed)
Fetal Movement Counts Patient Name: __________________________________________________ Patient Due Date: ____________________ Performing a fetal movement count is highly recommended in high-risk pregnancies, but it is good for every pregnant woman to do. Your health care provider may ask you to start counting fetal movements at 28 weeks of the pregnancy. Fetal movements often increase:  After eating a full meal.  After physical activity.  After eating or drinking something sweet or cold.  At rest. Pay attention to when you feel the baby is most active. This will help you notice a pattern of your baby's sleep and wake cycles and what factors contribute to an increase in fetal movement. It is important to perform a fetal movement count at the same time each day when your baby is normally most active.  HOW TO COUNT FETAL MOVEMENTS 1. Find a quiet and comfortable area to sit or lie down on your left side. Lying on your left side provides the best blood and oxygen circulation to your baby. 2. Write down the day and time on a sheet of paper or in a journal. 3. Start counting kicks, flutters, swishes, rolls, or jabs in a 2-hour period. You should feel at least 10 movements within 2 hours. 4. If you do not feel 10 movements in 2 hours, wait 2-3 hours and count again. Look for a change in the pattern or not enough counts in 2 hours. SEEK MEDICAL CARE IF:  You feel less than 10 counts in 2 hours, tried twice.  There is no movement in over an hour.  The pattern is changing or taking longer each day to reach 10 counts in 2 hours.  You feel the baby is not moving as he or she usually does. Date: ____________ Movements: ____________ Start time: ____________ Finish time: ____________  Date: ____________ Movements: ____________ Start time: ____________ Finish time: ____________ Date: ____________ Movements: ____________ Start time: ____________ Finish time: ____________ Date: ____________ Movements:  ____________ Start time: ____________ Finish time: ____________ Date: ____________ Movements: ____________ Start time: ____________ Finish time: ____________ Date: ____________ Movements: ____________ Start time: ____________ Finish time: ____________ Date: ____________ Movements: ____________ Start time: ____________ Finish time: ____________ Date: ____________ Movements: ____________ Start time: ____________ Finish time: ____________  Date: ____________ Movements: ____________ Start time: ____________ Finish time: ____________ Date: ____________ Movements: ____________ Start time: ____________ Finish time: ____________ Date: ____________ Movements: ____________ Start time: ____________ Finish time: ____________ Date: ____________ Movements: ____________ Start time: ____________ Finish time: ____________ Date: ____________ Movements: ____________ Start time: ____________ Finish time: ____________ Date: ____________ Movements: ____________ Start time: ____________ Finish time: ____________ Date: ____________ Movements: ____________ Start time: ____________ Finish time: ____________  Date: ____________ Movements: ____________ Start time: ____________ Finish time: ____________ Date: ____________ Movements: ____________ Start time: ____________ Finish time: ____________ Date: ____________ Movements: ____________ Start time: ____________ Finish time: ____________ Date: ____________ Movements: ____________ Start time: ____________ Finish time: ____________ Date: ____________ Movements: ____________ Start time: ____________ Finish time: ____________ Date: ____________ Movements: ____________ Start time: ____________ Finish time: ____________ Date: ____________ Movements: ____________ Start time: ____________ Finish time: ____________  Date: ____________ Movements: ____________ Start time: ____________ Finish time: ____________ Date: ____________ Movements: ____________ Start time: ____________ Finish  time: ____________ Date: ____________ Movements: ____________ Start time: ____________ Finish time: ____________ Date: ____________ Movements: ____________ Start time: ____________ Finish time: ____________ Date: ____________ Movements: ____________ Start time: ____________ Finish time: ____________ Date: ____________ Movements: ____________ Start time: ____________ Finish time: ____________ Date: ____________ Movements: ____________ Start time: ____________ Finish time: ____________  Date: ____________ Movements: ____________ Start time: ____________ Finish   time: ____________ Date: ____________ Movements: ____________ Start time: ____________ Finish time: ____________ Date: ____________ Movements: ____________ Start time: ____________ Finish time: ____________ Date: ____________ Movements: ____________ Start time: ____________ Finish time: ____________ Date: ____________ Movements: ____________ Start time: ____________ Finish time: ____________ Date: ____________ Movements: ____________ Start time: ____________ Finish time: ____________ Date: ____________ Movements: ____________ Start time: ____________ Finish time: ____________  Date: ____________ Movements: ____________ Start time: ____________ Finish time: ____________ Date: ____________ Movements: ____________ Start time: ____________ Finish time: ____________ Date: ____________ Movements: ____________ Start time: ____________ Finish time: ____________ Date: ____________ Movements: ____________ Start time: ____________ Finish time: ____________ Date: ____________ Movements: ____________ Start time: ____________ Finish time: ____________ Date: ____________ Movements: ____________ Start time: ____________ Finish time: ____________ Date: ____________ Movements: ____________ Start time: ____________ Finish time: ____________  Date: ____________ Movements: ____________ Start time: ____________ Finish time: ____________ Date: ____________  Movements: ____________ Start time: ____________ Finish time: ____________ Date: ____________ Movements: ____________ Start time: ____________ Finish time: ____________ Date: ____________ Movements: ____________ Start time: ____________ Finish time: ____________ Date: ____________ Movements: ____________ Start time: ____________ Finish time: ____________ Date: ____________ Movements: ____________ Start time: ____________ Finish time: ____________ Date: ____________ Movements: ____________ Start time: ____________ Finish time: ____________  Date: ____________ Movements: ____________ Start time: ____________ Finish time: ____________ Date: ____________ Movements: ____________ Start time: ____________ Finish time: ____________ Date: ____________ Movements: ____________ Start time: ____________ Finish time: ____________ Date: ____________ Movements: ____________ Start time: ____________ Finish time: ____________ Date: ____________ Movements: ____________ Start time: ____________ Finish time: ____________ Date: ____________ Movements: ____________ Start time: ____________ Finish time: ____________   This information is not intended to replace advice given to you by your health care provider. Make sure you discuss any questions you have with your health care provider.   Document Released: 09/07/2006 Document Revised: 08/29/2014 Document Reviewed: 06/04/2012 Elsevier Interactive Patient Education 2016 [employer] Inc[employer] Braxton Hicks Contractions Contractions of the uterus can occur throughout pregnancy. Contractions are not always a sign that you are in labor.  WHAT ARE BRAXTON HICKS CONTRACTIONS?  Contractions that occur before labor are called Braxton Hicks contractions, or false labor. Toward the end of pregnancy (32-34 weeks), these contractions can develop more often and may become more forceful. This is not true labor because these contractions do not result in opening (dilatation) and thinning of  the cervix. They are sometimes difficult to tell apart from true labor because these contractions can be forceful and people have different pain tolerances. You should not feel embarrassed if you go to the hospital with false labor. Sometimes, the only way to tell if you are in true labor is for your health care provider to look for changes in the cervix. If there are no prenatal problems or other health problems associated with the pregnancy, it is completely safe to be sent home with false labor and await the onset of true labor. HOW CAN YOU TELL THE DIFFERENCE BETWEEN TRUE AND FALSE LABOR? False Labor  The contractions of false labor are usually shorter and not as hard as those of true labor.   The contractions are usually irregular.   The contractions are often felt in the front of the lower abdomen and in the groin.   The contractions may go away when you walk around or change positions while lying down.   The contractions get weaker and are shorter lasting as time goes on.   The contractions do not usually become progressively stronger, regular, and closer together as with true labor.  True Labor 5. Contractions in true   labor last 30-70 seconds, become very regular, usually become more intense, and increase in frequency.  6. The contractions do not go away with walking.  7. The discomfort is usually felt in the top of the uterus and spreads to the lower abdomen and low back.  8. True labor can be determined by your health care provider with an exam. This will show that the cervix is dilating and getting thinner.  WHAT TO REMEMBER  Keep up with your usual exercises and follow other instructions given by your health care provider.   Take medicines as directed by your health care provider.   Keep your regular prenatal appointments.   Eat and drink lightly if you think you are going into labor.   If Braxton Hicks contractions are making you uncomfortable:   Change  your position from lying down or resting to walking, or from walking to resting.   Sit and rest in a tub of warm water.   Drink 2-3 glasses of water. Dehydration may cause these contractions.   Do slow and deep breathing several times an hour.  WHEN SHOULD I SEEK IMMEDIATE MEDICAL CARE? Seek immediate medical care if:  Your contractions become stronger, more regular, and closer together.   You have fluid leaking or gushing from your vagina.   You have a fever.   You pass blood-tinged mucus.   You have vaginal bleeding.   You have continuous abdominal pain.   You have low back pain that you never had before.   You feel your baby's head pushing down and causing pelvic pressure.   Your baby is not moving as much as it used to.    This information is not intended to replace advice given to you by your health care provider. Make sure you discuss any questions you have with your health care provider.   Document Released: 08/08/2005 Document Revised: 08/13/2013 Document Reviewed: 05/20/2013 Elsevier Interactive Patient Education 2016 [employer] Inc.  

## 2016-02-02 NOTE — Progress Notes (Signed)
Subjective:  Gail Perez is a 25 y.o. G2P1001 at 1249w1d being seen today for ongoing prenatal care.  She is currently monitored for the following issues for this low-risk pregnancy and has History of pregnancy induced hypertension; Echogenic intracardiac focus of fetus on prenatal ultrasound; and H/O LEEP on her problem list.  Patient reports mild LE edema.  Contractions: Irregular. Vag. Bleeding: None.  Movement: Present. Denies leaking of fluid.   The following portions of the patient's history were reviewed and updated as appropriate: allergies, current medications, past family history, past medical history, past social history, past surgical history and problem list. Problem list updated.  Objective:   Filed Vitals:   02/02/16 0850  BP: 135/81  Pulse: 79  Weight: 224 lb 3.2 oz (101.696 kg)    Fetal Status: Fetal Heart Rate (bpm): 131 Fundal Height: 39 cm Movement: Present  Presentation: Vertex  General:  Alert, oriented and cooperative. Patient is in no acute distress.  Skin: Skin is warm and dry. No rash noted.   Cardiovascular: Normal heart rate noted  Respiratory: Normal respiratory effort, no problems with respiration noted  Abdomen: Soft, gravid, appropriate for gestational age. Pain/Pressure: Present     Pelvic: Cervical exam performed Dilation: 1.5 Effacement (%): 50 Station: -3  Extremities: Normal range of motion.  Edema: Mild pitting, slight indentation  Mental Status: Normal mood and affect. Normal behavior. Normal judgment and thought content.   Urinalysis: Urine Protein: Negative Urine Glucose: Negative  Assessment and Plan:  Pregnancy: G2P1001 at 4849w1d  1. Supervision of normal pregnancy, third trimester - Patient doing well today - Mild LE edema, equal bilaterally, improves with rest, denies headache, visual changes, BP normal, negative for proteinuria today    Term labor symptoms and general obstetric precautions including but not limited to vaginal bleeding,  contractions, leaking of fluid and fetal movement were reviewed in detail with the patient. Please refer to After Visit Summary for other counseling recommendations.  Return in about 1 week (around 02/09/2016) for LOB FU and NST for post dates.   Marny LowensteinJulie N Wenzel, PA-C

## 2016-02-09 ENCOUNTER — Encounter: Payer: BLUE CROSS/BLUE SHIELD | Admitting: Advanced Practice Midwife

## 2016-02-09 ENCOUNTER — Ambulatory Visit (INDEPENDENT_AMBULATORY_CARE_PROVIDER_SITE_OTHER): Payer: BLUE CROSS/BLUE SHIELD | Admitting: Advanced Practice Midwife

## 2016-02-09 VITALS — BP 121/82 | HR 93 | Wt 226.9 lb

## 2016-02-09 DIAGNOSIS — Z36 Encounter for antenatal screening of mother: Secondary | ICD-10-CM | POA: Diagnosis not present

## 2016-02-09 DIAGNOSIS — Z3483 Encounter for supervision of other normal pregnancy, third trimester: Secondary | ICD-10-CM | POA: Diagnosis not present

## 2016-02-09 DIAGNOSIS — O480 Post-term pregnancy: Secondary | ICD-10-CM | POA: Diagnosis not present

## 2016-02-09 DIAGNOSIS — Z8759 Personal history of other complications of pregnancy, childbirth and the puerperium: Secondary | ICD-10-CM | POA: Diagnosis not present

## 2016-02-09 NOTE — Progress Notes (Signed)
Subjective:  Gail Perez is a 25 y.o. G2P1001 at 325w1d being seen today for ongoing prenatal care.  She is currently monitored for the following issues for this low-risk pregnancy and has History of pregnancy induced hypertension; Echogenic intracardiac focus of fetus on prenatal ultrasound; and H/O LEEP on her problem list.  Patient reports no complaints.  Contractions: Irregular. Vag. Bleeding: None.  Movement: Present. Denies leaking of fluid.   The following portions of the patient's history were reviewed and updated as appropriate: allergies, current medications, past family history, past medical history, past social history, past surgical history and problem list. Problem list updated.  Objective:   Filed Vitals:   02/09/16 1119  BP: 121/82  Pulse: 93  Weight: 226 lb 14.4 oz (102.921 kg)    Fetal Status: Fetal Heart Rate (bpm): NST   Movement: Present  Presentation: Vertex  General:  Alert, oriented and cooperative. Patient is in no acute distress.  Skin: Skin is warm and dry. No rash noted.   Cardiovascular: Normal heart rate noted  Respiratory: Normal respiratory effort, no problems with respiration noted  Abdomen: Soft, gravid, appropriate for gestational age. Pain/Pressure: Present     Pelvic: Cervical exam performed Dilation: 1.5 Effacement (%): 50 Station: -3, membranes swept at today's visit  Extremities: Normal range of motion.  Edema: Mild pitting, slight indentation  Mental Status: Normal mood and affect. Normal behavior. Normal judgment and thought content.   Urinalysis:      Assessment and Plan:  Pregnancy: G2P1001 at 405w1d  1. Post term pregnancy, antepartum condition or complication --NST reactive - Amniotic fluid index with NST --Membranes swept --IOL scheduled at 41 weeks  Term labor symptoms and general obstetric precautions including but not limited to vaginal bleeding, contractions, leaking of fluid and fetal movement were reviewed in detail with the  patient. Please refer to After Visit Summary for other counseling recommendations.  Return in about 6 weeks (around 03/22/2016) for PP visit.  IOL on 6/26.   Hurshel PartyLisa A Leftwich-Kirby, CNM

## 2016-02-10 ENCOUNTER — Encounter: Payer: Self-pay | Admitting: General Practice

## 2016-02-11 ENCOUNTER — Encounter: Payer: Self-pay | Admitting: Advanced Practice Midwife

## 2016-02-12 ENCOUNTER — Ambulatory Visit (INDEPENDENT_AMBULATORY_CARE_PROVIDER_SITE_OTHER): Payer: BLUE CROSS/BLUE SHIELD | Admitting: General Practice

## 2016-02-12 ENCOUNTER — Ambulatory Visit (HOSPITAL_COMMUNITY)
Admission: RE | Admit: 2016-02-12 | Discharge: 2016-02-12 | Disposition: A | Discharge status: Home / Self Care | Payer: BLUE CROSS/BLUE SHIELD | Source: Ambulatory Visit | Attending: Family Medicine | Admitting: Family Medicine

## 2016-02-12 VITALS — BP 139/72 | HR 83

## 2016-02-12 DIAGNOSIS — Z3A40 40 weeks gestation of pregnancy: Secondary | ICD-10-CM | POA: Insufficient documentation

## 2016-02-12 DIAGNOSIS — O480 Post-term pregnancy: Secondary | ICD-10-CM | POA: Diagnosis present

## 2016-02-12 DIAGNOSIS — O288 Other abnormal findings on antenatal screening of mother: Secondary | ICD-10-CM

## 2016-02-12 DIAGNOSIS — O289 Unspecified abnormal findings on antenatal screening of mother: Secondary | ICD-10-CM | POA: Diagnosis present

## 2016-02-12 NOTE — Progress Notes (Signed)
NST NR; Dr Shawnie PonsPratt reviewed-patient upstairs for BPP, if BPP unfavorable patient will stay for induction instead of scheduled IOL on 6/26. Patient informed of updated plan of care

## 2016-02-12 NOTE — Progress Notes (Signed)
NST reviewed and reactive. Some late decels noted with contractions. Given GA, will get BPP and if nml, ok for IOL at 41 wks, if abnl, for delivery

## 2016-02-15 ENCOUNTER — Encounter (HOSPITAL_COMMUNITY): Payer: Self-pay

## 2016-02-15 ENCOUNTER — Inpatient Hospital Stay (HOSPITAL_COMMUNITY)
Admission: RE | Admit: 2016-02-15 | Discharge: 2016-02-18 | DRG: 775 | Disposition: A | Discharge status: Home / Self Care | Payer: BLUE CROSS/BLUE SHIELD | Source: Ambulatory Visit | Attending: Obstetrics and Gynecology | Admitting: Obstetrics and Gynecology

## 2016-02-15 VITALS — BP 133/86 | HR 78 | Temp 98.3°F | Resp 18 | Ht 69.0 in | Wt 222.0 lb

## 2016-02-15 DIAGNOSIS — Z3A41 41 weeks gestation of pregnancy: Secondary | ICD-10-CM

## 2016-02-15 DIAGNOSIS — O134 Gestational [pregnancy-induced] hypertension without significant proteinuria, complicating childbirth: Secondary | ICD-10-CM | POA: Diagnosis present

## 2016-02-15 DIAGNOSIS — O480 Post-term pregnancy: Secondary | ICD-10-CM | POA: Diagnosis present

## 2016-02-15 DIAGNOSIS — Z8759 Personal history of other complications of pregnancy, childbirth and the puerperium: Secondary | ICD-10-CM

## 2016-02-15 DIAGNOSIS — Z9889 Other specified postprocedural states: Secondary | ICD-10-CM

## 2016-02-15 DIAGNOSIS — O283 Abnormal ultrasonic finding on antenatal screening of mother: Secondary | ICD-10-CM | POA: Diagnosis present

## 2016-02-15 DIAGNOSIS — Z8249 Family history of ischemic heart disease and other diseases of the circulatory system: Secondary | ICD-10-CM

## 2016-02-15 DIAGNOSIS — O139 Gestational [pregnancy-induced] hypertension without significant proteinuria, unspecified trimester: Secondary | ICD-10-CM | POA: Diagnosis present

## 2016-02-15 LAB — CBC
HEMATOCRIT: 35 % — AB (ref 36.0–46.0)
Hemoglobin: 11.4 g/dL — ABNORMAL LOW (ref 12.0–15.0)
MCH: 26.6 pg (ref 26.0–34.0)
MCHC: 32.6 g/dL (ref 30.0–36.0)
MCV: 81.8 fL (ref 78.0–100.0)
PLATELETS: 257 10*3/uL (ref 150–400)
RBC: 4.28 MIL/uL (ref 3.87–5.11)
RDW: 15.8 % — AB (ref 11.5–15.5)
WBC: 10.6 10*3/uL — AB (ref 4.0–10.5)

## 2016-02-15 LAB — PROTEIN / CREATININE RATIO, URINE: CREATININE, URINE: 60 mg/dL

## 2016-02-15 LAB — COMPREHENSIVE METABOLIC PANEL
ALK PHOS: 201 U/L — AB (ref 38–126)
ALT: 11 U/L — AB (ref 14–54)
AST: 20 U/L (ref 15–41)
Albumin: 2.8 g/dL — ABNORMAL LOW (ref 3.5–5.0)
Anion gap: 10 (ref 5–15)
BILIRUBIN TOTAL: 0.7 mg/dL (ref 0.3–1.2)
BUN: 7 mg/dL (ref 6–20)
CALCIUM: 8.8 mg/dL — AB (ref 8.9–10.3)
CO2: 22 mmol/L (ref 22–32)
CREATININE: 0.78 mg/dL (ref 0.44–1.00)
Chloride: 103 mmol/L (ref 101–111)
Glucose, Bld: 83 mg/dL (ref 65–99)
Potassium: 3.8 mmol/L (ref 3.5–5.1)
Sodium: 135 mmol/L (ref 135–145)
TOTAL PROTEIN: 6.7 g/dL (ref 6.5–8.1)

## 2016-02-15 LAB — TYPE AND SCREEN
ABO/RH(D): O POS
Antibody Screen: NEGATIVE

## 2016-02-15 MED ORDER — ONDANSETRON HCL 4 MG/2ML IJ SOLN: 4.0000 mg | Freq: Four times a day (QID) | INTRAMUSCULAR | Status: DC | PRN

## 2016-02-15 MED ORDER — OXYTOCIN 40 UNITS IN LACTATED RINGERS INFUSION - SIMPLE MED: 2.5000 [IU]/h | INTRAVENOUS | Status: DC

## 2016-02-15 MED ORDER — LIDOCAINE HCL (PF) 1 % IJ SOLN
30.0000 mL | INTRAMUSCULAR | Status: AC | PRN
  Administered 2016-02-16: 30 mL via SUBCUTANEOUS
  Filled 2016-02-15: qty 30

## 2016-02-15 MED ORDER — FENTANYL CITRATE (PF) 100 MCG/2ML IJ SOLN
INTRAMUSCULAR | Status: AC
  Administered 2016-02-15: 100 ug via INTRAVENOUS
  Filled 2016-02-15: qty 2

## 2016-02-15 MED ORDER — FENTANYL CITRATE (PF) 100 MCG/2ML IJ SOLN
100.0000 ug | INTRAMUSCULAR | Status: DC | PRN
Start: 2016-02-15 — End: 2016-02-16
  Administered 2016-02-15 – 2016-02-16 (×3): 100 ug via INTRAVENOUS
  Filled 2016-02-15 (×2): qty 2

## 2016-02-15 MED ORDER — OXYCODONE-ACETAMINOPHEN 5-325 MG PO TABS: 1.0000 | ORAL_TABLET | ORAL | Status: DC | PRN

## 2016-02-15 MED ORDER — ACETAMINOPHEN 325 MG PO TABS
650.0000 mg | ORAL_TABLET | ORAL | Status: DC | PRN
Start: 2016-02-15 — End: 2016-02-16

## 2016-02-15 MED ORDER — OXYCODONE-ACETAMINOPHEN 5-325 MG PO TABS: 2.0000 | ORAL_TABLET | ORAL | Status: DC | PRN

## 2016-02-15 MED ORDER — LACTATED RINGERS IV SOLN: 500.0000 mL | INTRAVENOUS | Status: DC | PRN

## 2016-02-15 MED ORDER — OXYTOCIN BOLUS FROM INFUSION
500.0000 mL | INTRAVENOUS | Status: DC
  Administered 2016-02-16: 500 mL via INTRAVENOUS

## 2016-02-15 MED ORDER — TERBUTALINE SULFATE 1 MG/ML IJ SOLN: 0.2500 mg | Freq: Once | INTRAMUSCULAR | Status: DC | PRN

## 2016-02-15 MED ORDER — MISOPROSTOL 25 MCG QUARTER TABLET
25.0000 ug | ORAL_TABLET | ORAL | Status: DC | PRN
  Administered 2016-02-15: 25 ug via VAGINAL
  Filled 2016-02-15: qty 0.25

## 2016-02-15 MED ORDER — SOD CITRATE-CITRIC ACID 500-334 MG/5ML PO SOLN: 30.0000 mL | ORAL | Status: DC | PRN

## 2016-02-15 MED ORDER — OXYTOCIN 40 UNITS IN LACTATED RINGERS INFUSION - SIMPLE MED
1.0000 m[IU]/min | INTRAVENOUS | Status: DC
  Administered 2016-02-15: 2 m[IU]/min via INTRAVENOUS
  Filled 2016-02-15: qty 1000

## 2016-02-15 MED ORDER — LACTATED RINGERS IV SOLN
INTRAVENOUS | Status: DC
  Administered 2016-02-15 (×3): via INTRAVENOUS

## 2016-02-15 NOTE — H&P (Signed)
LABOR AND DELIVERY ADMISSION HISTORY AND PHYSICAL NOTE  Gail Perez is a 25 y.o. female G2P1001 with IUP at 9448w0d by LMP presenting for IOL for PD.  Prenatal risk factors: gHTN, echogenic focus on fetal heart She reports positive fetal movement. She denies leakage of fluid or vaginal bleeding.  Prenatal History/Complications:  Past Medical History: Past Medical History  Diagnosis Date  . Seasonal allergies   . Urinary tract infection   . Hx of varicella   . Chlamydia   . Allergy   . Vaginal Pap smear, abnormal   . Pregnancy induced hypertension 2014    Past Surgical History: Past Surgical History  Procedure Laterality Date  . Leep      Obstetrical History: OB History    Gravida Para Term Preterm AB TAB SAB Ectopic Multiple Living   2 1 1  0 0 0 0 0 0 1      Social History: Social History   Social History  . Marital Status: Single    Spouse Name: N/A  . Number of Children: N/A  . Years of Education: N/A   Social History Main Topics  . Smoking status: Never Smoker   . Smokeless tobacco: Never Used  . Alcohol Use: No     Comment: occasional  . Drug Use: No  . Sexual Activity: Yes    Birth Control/ Protection: None   Other Topics Concern  . None   Social History Narrative    Family History: Family History  Problem Relation Age of Onset  . Other Neg Hx   . Hypertension Father   . Hyperlipidemia Father   . Heart disease Father     enlatged heart  . Sarcoidosis Father   . Cancer Maternal Grandfather     lung  . Anemia Mother   . Hypertension Sister     Allergies: No Known Allergies  Prescriptions prior to admission  Medication Sig Dispense Refill Last Dose  . loratadine (CLARITIN) 10 MG tablet Take 10 mg by mouth daily. Reported on 02/02/2016   Past Month at Unknown time     Review of Systems   All systems reviewed and negative except as stated in HPI  Blood pressure 141/87, pulse 89, temperature 97.4 F (36.3 C), temperature source  Axillary, resp. rate 18, height 5\' 9"  (1.753 m), weight 222 lb (100.699 kg), last menstrual period 05/04/2015. General appearance: alert, cooperative and appears stated age Lungs: clear to auscultation bilaterally Heart: regular rate and rhythm Abdomen: soft, non-tender; bowel sounds normal Extremities: No calf swelling or tenderness Presentation: cephalic Fetal monitoring: Baseline 125, mod variability, +acels, no decels Uterine activity: No regular contractions pattern noted on strip     Prenatal labs: ABO, Rh:  O POS Antibody:  NEG Rubella: IMMUNE RPR: NON REAC (04/03 1615)  HBsAg:   NEG HIV: NONREACTIVE (04/03 1615)  GBS: Negative (05/23 0000)  1 hr Glucola: 68 Genetic screening:  normal Anatomy US: echogenic focus on fetal heart   Prenatal Transfer Tool  Maternal Diabetes: No Genetic Screening: Normal Maternal Ultrasounds/Referrals: Abnormal:  Findings:   Isolated EIF (echogenic intracardiac focus) Fetal Ultrasounds or other Referrals:  None Maternal Substance Abuse:  No Significant Maternal Medications:  None Significant Maternal Lab Results: None  No results found for this or any previous visit (from the past 24 hour(s)).  Patient Active Problem List   Diagnosis Date Noted  . Pregnancy 02/15/2016  . Echogenic intracardiac focus of fetus on prenatal ultrasound 11/23/2015  . H/O LEEP 11/23/2015  .  History of pregnancy induced hypertension 11/22/2015    Assessment: Gail Perez is a 25 y.o. G2P1001 at 5133w0d here for IOL for PD  #Labor:Latent, started with cytotec.  Pt has declined foley bulb due to past experience. #Pain: Epidural #FWB: Cat-1 #ID:  GBS negative #MOF: Breast #MOC:OCP #Circ:  Female, plans outpatient   Olena LeatherwoodKelly M Aguilar 02/15/2016, 8:58 AM   Midwife attestation: I have seen and examined this patient; I agree with above documentation in the residents's note.   Gail Perez is a 25 y.o. G2P1001 here for IOL for PD  PE: BP 147/83 mmHg  Pulse  84  Temp(Src) 97.5 F (36.4 C) (Oral)  Resp 18  Ht 5\' 9"  (1.753 m)  Wt 222 lb (100.699 kg)  BMI 32.77 kg/m2  LMP 05/04/2015 (Approximate) Gen: calm comfortable, NAD Resp: normal effort, no distress Abd: gravid  ROS, labs, PMH reviewed  Plan: Admit to LD Labor: latent FWB: Cat I ID: GBS neg  gHTN-no severe range or evidence of pre-e  Donette LarryMelanie Jania Steinke, CNM  02/15/2016, 11:19 AM

## 2016-02-15 NOTE — Progress Notes (Signed)
Labor Progress Note Gail Perez is a 25 y.o. G2P1001 at 3774w0d admitted for IOL for PD and found to have gHTN.  S: reports contraction every 3 minutes. Denies headache, vision changes, RUQ pain or shortness of breath. Family (mother, FOB and sister) at bedside  O:  BP 145/89 mmHg  Pulse 82  Temp(Src) 99.3 F (37.4 C) (Oral)  Resp 18  Ht 5\' 9"  (1.753 m)  Wt 222 lb (100.699 kg)  BMI 32.77 kg/m2  LMP 05/04/2015 (Approximate) EFM: 120/mod var/no decels  CVE: Dilation: 2 Effacement (%): 50 Station: -3 Presentation: Vertex Exam by:: Aguilar   A&P: 25 y.o. G2P1001 374w0d admitted for IOL for PD and found to have gHTN #gHTN: BP in normal to mild range. No severe features. PIH labs neg. Will continue monitoring #Labor: declined FB. Will continue pit. Feels contraction every 3 minutes. #Pain: IV pain meds. Declined epidural #FWB: CAT-1 #GBS: neg  Almon Herculesaye T Gonfa, MD 8:46 PM

## 2016-02-15 NOTE — Anesthesia Pain Management Evaluation Note (Signed)
  CRNA Pain Management Visit Note  Patient: Carney Bernyisha Keatts, 25 y.o., female  "Hello I am a [facility] the anesthesia [facility]. We have an anesthesia team available at all times to provide care throughout the hospital, including epidural management and anesthesia for C-section. I don't know your plan for the delivery whether it a natural birth, water birth, IV sedation, nitrous supplementation, doula or epidural, but we want to meet your pain goals."   1.Was your pain managed to your expectations on prior hospitalizations?   No   2.What is your expectation for pain management during this hospitalization?     IV pain meds  3.How can we help you reach that goal? IV pain rx   Record the patient's initial score and the patient's pain goal.   Pain: 0  Pain Goal: 6 [facility] wants you to be able to say your pain was always managed very well.  Preston Weill 02/15/2016

## 2016-02-15 NOTE — Progress Notes (Signed)
Labor Progress Note Gail Perez is a 25 y.o. G2P1001 at 325w0d presented for IOL for PD  S:  Some cramping, denies need for pain meds.   O:  BP 147/83 mmHg  Pulse 84  Temp(Src) 97.5 F (36.4 C) (Oral)  Resp 18  Ht 5\' 9"  (1.753 m)  Wt 222 lb (100.699 kg)  BMI 32.77 kg/m2  LMP 05/04/2015 (Approximate) EFM: baseline 130 bpm/mod variability/+ accels/ no decels  Toco: 2-5, mild SVE: Dilation: 2.5 Effacement (%): 80 Station: -3 Presentation: Vertex Exam by:: m Gail Perez cnm   A/P: 25 y.o. G2P1001 5825w0d  1. Labor: latent- s/p Cytotec x1 2. FWB: Cat I 3. Pain: analgesia/NO prn 4. GBS negative 5. gHTN-stable Start Pitocin, anticipate labor progression and SVD.  Donette LarryMelanie Jakyron Fabro, CNM 2:14 PM

## 2016-02-16 ENCOUNTER — Inpatient Hospital Stay (HOSPITAL_COMMUNITY): Payer: BLUE CROSS/BLUE SHIELD | Admitting: Anesthesiology

## 2016-02-16 ENCOUNTER — Encounter (HOSPITAL_COMMUNITY): Payer: Self-pay

## 2016-02-16 DIAGNOSIS — O134 Gestational [pregnancy-induced] hypertension without significant proteinuria, complicating childbirth: Secondary | ICD-10-CM

## 2016-02-16 DIAGNOSIS — O480 Post-term pregnancy: Secondary | ICD-10-CM

## 2016-02-16 DIAGNOSIS — O139 Gestational [pregnancy-induced] hypertension without significant proteinuria, unspecified trimester: Secondary | ICD-10-CM | POA: Diagnosis present

## 2016-02-16 DIAGNOSIS — Z3A41 41 weeks gestation of pregnancy: Secondary | ICD-10-CM

## 2016-02-16 LAB — CBC
HEMATOCRIT: 33 % — AB (ref 36.0–46.0)
HEMOGLOBIN: 11.1 g/dL — AB (ref 12.0–15.0)
MCH: 26.4 pg (ref 26.0–34.0)
MCHC: 33.6 g/dL (ref 30.0–36.0)
MCV: 78.4 fL (ref 78.0–100.0)
PLATELETS: 231 10*3/uL (ref 150–400)
RBC: 4.21 MIL/uL (ref 3.87–5.11)
RDW: 15.5 % (ref 11.5–15.5)
WBC: 11.2 10*3/uL — ABNORMAL HIGH (ref 4.0–10.5)

## 2016-02-16 LAB — RPR: RPR: NONREACTIVE

## 2016-02-16 MED ORDER — PRENATAL MULTIVITAMIN CH
1.0000 | ORAL_TABLET | Freq: Every day | ORAL | Status: DC
  Administered 2016-02-16 – 2016-02-18 (×3): 1 via ORAL
  Filled 2016-02-16 (×3): qty 1

## 2016-02-16 MED ORDER — PHENYLEPHRINE 40 MCG/ML (10ML) SYRINGE FOR IV PUSH (FOR BLOOD PRESSURE SUPPORT)
80.0000 ug | PREFILLED_SYRINGE | INTRAVENOUS | Status: DC | PRN
  Filled 2016-02-16: qty 10

## 2016-02-16 MED ORDER — LACTATED RINGERS IV SOLN: 500.0000 mL | Freq: Once | INTRAVENOUS | Status: DC

## 2016-02-16 MED ORDER — DIPHENHYDRAMINE HCL 25 MG PO CAPS: 25.0000 mg | ORAL_CAPSULE | Freq: Four times a day (QID) | ORAL | Status: DC | PRN

## 2016-02-16 MED ORDER — DIBUCAINE 1 % RE OINT
1.0000 "application " | TOPICAL_OINTMENT | RECTAL | Status: DC | PRN
  Filled 2016-02-16: qty 28.4

## 2016-02-16 MED ORDER — EPHEDRINE 5 MG/ML INJ: 10.0000 mg | INTRAVENOUS | Status: DC | PRN

## 2016-02-16 MED ORDER — DOCUSATE SODIUM 100 MG PO CAPS
100.0000 mg | ORAL_CAPSULE | Freq: Two times a day (BID) | ORAL | Status: DC
  Administered 2016-02-16 – 2016-02-18 (×4): 100 mg via ORAL
  Filled 2016-02-16 (×4): qty 1

## 2016-02-16 MED ORDER — ONDANSETRON HCL 4 MG/2ML IJ SOLN: 4.0000 mg | INTRAMUSCULAR | Status: DC | PRN

## 2016-02-16 MED ORDER — SIMETHICONE 80 MG PO CHEW: 80.0000 mg | CHEWABLE_TABLET | ORAL | Status: DC | PRN

## 2016-02-16 MED ORDER — BENZOCAINE-MENTHOL 20-0.5 % EX AERO
1.0000 "application " | INHALATION_SPRAY | CUTANEOUS | Status: DC | PRN
  Filled 2016-02-16: qty 56

## 2016-02-16 MED ORDER — PHENYLEPHRINE 40 MCG/ML (10ML) SYRINGE FOR IV PUSH (FOR BLOOD PRESSURE SUPPORT): 80.0000 ug | PREFILLED_SYRINGE | INTRAVENOUS | Status: DC | PRN

## 2016-02-16 MED ORDER — ONDANSETRON HCL 4 MG PO TABS: 4.0000 mg | ORAL_TABLET | ORAL | Status: DC | PRN

## 2016-02-16 MED ORDER — EPHEDRINE 5 MG/ML INJ
10.0000 mg | INTRAVENOUS | Status: DC | PRN
Start: 2016-02-16 — End: 2016-02-16

## 2016-02-16 MED ORDER — COCONUT OIL OIL
1.0000 "application " | TOPICAL_OIL | Status: DC | PRN
  Filled 2016-02-16: qty 120

## 2016-02-16 MED ORDER — OXYCODONE-ACETAMINOPHEN 5-325 MG PO TABS: 2.0000 | ORAL_TABLET | ORAL | Status: DC | PRN

## 2016-02-16 MED ORDER — ACETAMINOPHEN 325 MG PO TABS
650.0000 mg | ORAL_TABLET | ORAL | Status: DC | PRN
  Administered 2016-02-16 (×2): 650 mg via ORAL
  Filled 2016-02-16 (×2): qty 2

## 2016-02-16 MED ORDER — WITCH HAZEL-GLYCERIN EX PADS: 1.0000 "application " | MEDICATED_PAD | CUTANEOUS | Status: DC | PRN

## 2016-02-16 MED ORDER — FENTANYL 2.5 MCG/ML BUPIVACAINE 1/10 % EPIDURAL INFUSION (WH - ANES)
14.0000 mL/h | INTRAMUSCULAR | Status: DC | PRN
  Administered 2016-02-16: 14 mL/h via EPIDURAL
  Filled 2016-02-16: qty 125

## 2016-02-16 MED ORDER — DIPHENHYDRAMINE HCL 50 MG/ML IJ SOLN: 12.5000 mg | INTRAMUSCULAR | Status: DC | PRN

## 2016-02-16 MED ORDER — OXYCODONE-ACETAMINOPHEN 5-325 MG PO TABS: 1.0000 | ORAL_TABLET | ORAL | Status: DC | PRN

## 2016-02-16 MED ORDER — IBUPROFEN 600 MG PO TABS
600.0000 mg | ORAL_TABLET | Freq: Four times a day (QID) | ORAL | Status: DC
  Administered 2016-02-16 – 2016-02-18 (×10): 600 mg via ORAL
  Filled 2016-02-16 (×10): qty 1

## 2016-02-16 MED ORDER — LIDOCAINE HCL (PF) 1 % IJ SOLN
INTRAMUSCULAR | Status: DC | PRN
  Administered 2016-02-16 (×2): 6 mL

## 2016-02-16 NOTE — Lactation Note (Signed)
This note was copied from a baby's chart. Lactation Consultation Note  Patient Name: Gail Perez'XToday's Date: 02/16/2016 Reason for consult: Initial assessment  Baby 6 hours old, breast fed after birth. Also per mom stool at birth.  Baby skin to skin when LC walked in, per mom recently attempted and baby acted like  He wanted to feed. LC repositioned baby , checked diaper / dry/ LC assisted mom to reposition  Baby in football and after attempts latched on and off, no swallows. Per mom with her 1st baby had  To use a NS. Patient's friend brought mom a #24 NS. LC provided a hand pump with instructions,  And LC assisted to pre- pump with hand pump 8-10 strokes, and areola more elastic, baby still awake,  And baby latched after several attempts and sustained a latch for 8 mins, no swallows noted , but depth  Obtained and consistent feeding pattern. Baby released and few minutes later and re- latched and was still feeding When LC was leaving room.  LC recommended prior to latch - breast massage , hand expressing ( shown at consult ) and pre- pump to make the  Nipple and areola more elastic for a deeper latch. Also to wear shells when baby not skin to skin .  Nipple shields #20 and #24 in the room but not needed to latch.  Mother informed of post-discharge support and given phone number to the lactation department, including services for phone  call assistance; out-patient appointments; and breastfeeding support group. List of other breastfeeding resources in the community  given in the handout. Encouraged mother to call for problems or concerns related to breastfeeding.    Maternal Data Has patient been taught Hand Expression?: Yes Does the patient have breastfeeding experience prior to this delivery?: Yes  Feeding Feeding Type: Breast Fed Length of feed: 8 min (latched after several attempts, no swallows noted )  LATCH Score/Interventions Latch: Repeated attempts needed to sustain  latch, nipple held in mouth throughout feeding, stimulation needed to elicit sucking reflex. Intervention(s): Adjust position;Assist with latch;Breast massage;Breast compression  Audible Swallowing: None  Type of Nipple: Everted at rest and after stimulation  Comfort (Breast/Nipple): Soft / non-tender     Hold (Positioning): Full assist, staff holds infant at breast Intervention(s): Breastfeeding basics reviewed;Support Pillows;Position options;Skin to skin  LATCH Score: 5  Lactation Tools Discussed/Used Tools: Shells;Pump Shell Type: Inverted Breast pump type: Manual   Consult Status Consult Status: Follow-up Date: 02/16/16 Follow-up type: In-patient    [name], [name] 02/16/2016, 11:53 AM

## 2016-02-16 NOTE — Anesthesia Procedure Notes (Signed)
Epidural Patient location during procedure: OB  Staffing Anesthesiologist: Cullan Launer  Preanesthetic Checklist Completed: patient identified, site marked, surgical consent, pre-op evaluation, timeout performed, IV checked, risks and benefits discussed and monitors and equipment checked  Epidural Patient position: sitting Prep: DuraPrep Patient monitoring: blood pressure and heart rate Approach: midline Location: L3-L4 Injection technique: LOR saline  Needle:  Needle type: Tuohy  Needle gauge: 17 G Needle length: 9 cm Needle insertion depth: 5 cm Catheter type: closed end flexible Catheter size: 19 Gauge Catheter at skin depth: 12 cm Test dose: negative and Other  Assessment Events: blood not aspirated, injection not painful, no injection resistance, negative IV test and no paresthesia  Additional Notes Reason for block:procedure for pain   

## 2016-02-16 NOTE — Anesthesia Postprocedure Evaluation (Signed)
Anesthesia Post Note  Patient: Gail Perez  Procedure(s) Performed: * No procedures listed *  Patient location during evaluation: Mother Baby Anesthesia Type: Epidural Level of consciousness: awake and alert Pain management: pain level controlled Vital Signs Assessment: post-procedure vital signs reviewed and stable Respiratory status: spontaneous breathing, nonlabored ventilation and respiratory function stable Cardiovascular status: stable Postop Assessment: no headache, no backache and epidural receding Anesthetic complications: no     Last Vitals:  Filed Vitals:   02/16/16 0822 02/16/16 0920  BP: 142/86 135/72  Pulse: 79 81  Temp: 36.7 C 36.5 C  Resp: 18     Last Pain:  Filed Vitals:   02/16/16 0947  PainSc: 1    Pain Goal: Patients Stated Pain Goal: 6 (02/16/16 0225)               Junious SilkGILBERT,Avyon Herendeen

## 2016-02-16 NOTE — Anesthesia Preprocedure Evaluation (Addendum)
Anesthesia Evaluation  Patient identified by MRN, date of birth, ID band Patient awake    Reviewed: Allergy & Precautions, NPO status , Patient's Chart, lab work & pertinent test results  Airway Mallampati: II  TM Distance: >3 FB Neck ROM: Full    Dental no notable dental hx.    Pulmonary neg pulmonary ROS,    Pulmonary exam normal breath sounds clear to auscultation       Cardiovascular hypertension, negative cardio ROS Normal cardiovascular exam Rhythm:Regular Rate:Normal     Neuro/Psych negative neurological ROS  negative psych ROS   GI/Hepatic negative GI ROS, Neg liver ROS,   Endo/Other  negative endocrine ROS  Renal/GU negative Renal ROS  negative genitourinary   Musculoskeletal negative musculoskeletal ROS (+)   Abdominal   Peds negative pediatric ROS (+)  Hematology negative hematology ROS (+)   Anesthesia Other Findings   Reproductive/Obstetrics negative OB ROS                             Anesthesia Physical Anesthesia Plan  ASA: II  Anesthesia Plan: Epidural   Post-op Pain Management:    Induction: Intravenous  Airway Management Planned: Natural Airway  Additional Equipment:   Intra-op Plan:   Post-operative Plan:   Informed Consent: I have reviewed the patients History and Physical, chart, labs and discussed the procedure including the risks, benefits and alternatives for the proposed anesthesia with the patient or authorized representative who has indicated his/her understanding and acceptance.   Dental advisory given  Plan Discussed with: CRNA  Anesthesia Plan Comments: (Informed consent obtained prior to proceeding including risk of failure, 1% risk of PDPH, risk of minor discomfort and bruising.  Discussed rare but serious complications including epidural abscess, permanent nerve injury, epidural hematoma.  Discussed alternatives to epidural analgesia and  patient desires to proceed.  Timeout performed pre-procedure verifying patient name, procedure, and platelet count.  Patient tolerated procedure well. )        Anesthesia Quick Evaluation

## 2016-02-16 NOTE — Progress Notes (Signed)
Assumed care of patient. Pt appears sitting at bedside and comfortable. Denies pain. Uterus firm @ U with small bleeding. Will be transported to room 105 on mother/baby. Keylie Beavers L Cem Kosman,RN.

## 2016-02-17 NOTE — Progress Notes (Signed)
POSTPARTUM PROGRESS NOTE  Post Partum Day 1 Subjective:  Gail Perez is a 25 y.o. [id number] 8211w1d s/p SVD after IOL for gHTN (no medications).  No acute events overnight.  Pt denies problems with ambulating, voiding or po intake.  She denies nausea or vomiting.  Pain is well controlled.  She has had flatus. She has not had bowel movement.  Lochia Minimal.   Objective: Blood pressure 125/64, pulse 71, temperature 98.5 F (36.9 C), temperature source Oral, resp. rate 18, height 5\' 9"  (1.753 m), weight 222 lb (100.699 kg), last menstrual period 05/04/2015, SpO2 100 %, unknown if currently breastfeeding.  Physical Exam:  General: alert, cooperative and no distress Lochia:normal flow Chest: CTAB Heart: RRR no m/r/g Abdomen: +BS, soft, nontender,  Uterine Fundus: firm, 2 below umbilicus DVT Evaluation: No calf swelling or tenderness Extremities: No edema   Recent Labs  02/15/16 0755 02/16/16 0339  HGB 11.4* 11.1*  HCT 35.0* 33.0*    Assessment/Plan:  ASSESSMENT: Gail Perez is a 25 y.o. [id number] 7511w1d s/p NSVD  #gHTN -BP have down trended to 130-140 range -Last severe range BP 170/104 was 6/27 and pt had normal PIH labs on admission  #Postpartum -Breastfeeding -BC plan is OCP -Except d/c in AM tomorrow   LOS: 2 days   Olena LeatherwoodKelly M Aguilar 02/17/2016, 7:29 AM   OB fellow attestation:  I have seen and examined this patient; I agree with above documentation in the resident's note.   Federico FlakeKimberly Niles Sharla Tankard, MD 2:03 PM

## 2016-02-17 NOTE — Lactation Note (Signed)
This note was copied from a baby's chart. Lactation Consultation Note  Patient Name: Gail Perez FAOZH'YToday's Date: 02/17/2016 Reason for consult: Follow-up assessment Baby at 36 hr of life and on dbl photo therapy. Mom reports she is offering both breast for at least 10 minutes each then f/u with formula per guidelines. She is using the DEBP every 3 hr around the clock but has only gotten drops. Mom stated baby is latching well but is very sleepy. She denies breast or nipple pain. Encouraged mom to continue latching baby on demand and pumping. Reviewed breast changes and milk handling/storage. She is aware of lactation services and support group. She will call as needed.   Maternal Data Formula Feeding for Exclusion: No Has patient been taught Hand Expression?: Yes Does the patient have breastfeeding experience prior to this delivery?: Yes  Feeding Feeding Type: Breast Fed  LATCH Score/Interventions                      Lactation Tools Discussed/Used     Consult Status Consult Status: Follow-up Date: 02/18/16 Follow-up type: In-patient    Gail Perez 02/17/2016, 5:39 PM

## 2016-02-18 ENCOUNTER — Ambulatory Visit: Payer: Self-pay

## 2016-02-18 MED ORDER — ACETAMINOPHEN 325 MG PO TABS: 650.0000 mg | ORAL_TABLET | ORAL | Status: DC | PRN

## 2016-02-18 NOTE — Lactation Note (Signed)
This note was copied from a baby's chart. Lactation Consultation Note  Patient Name: Boy Carney Bernyisha [place] ZOXWR'UToday's Date: 02/18/2016 Reason for consult: Follow-up assessment;Hyperbilirubinemia   Follow up with mom of 60 hour old infant. Infant with 1 BF for 15 minutes, 3 BF attempts, Bottle fed formula x 7 of 10-39 cc, EBM x 1 via bottle of 21 cc, 6 voids and 4 stools in 24 hours preceding this assessment. Infant weight 7 lb 14.1 oz with weight loss of 2% since birth. Infant with + DAT and under triple phototherapy.  Mom has been pumping and milk is coming in. Breasts are filling, without engorgement at this time. Mom pumped 21 cc at last pumping. She reports she is pumping every 2.5 hours.   Enc mom to continue pumping every 2-3 hours for 15 minutes and to put baby to breast as able. Enc mom to call for assistance as needed.    Maternal Data Formula Feeding for Exclusion: No  Feeding    LATCH Score/Interventions                      Lactation Tools Discussed/Used Pump Review: Setup, frequency, and cleaning;Milk Storage Initiated by:: Reviewed by Max FickleS Rawleigh Rode   Consult Status Consult Status: Follow-up Date: 02/19/16 Follow-up type: In-patient    Silas FloodSharon S Dwight Burdo 02/18/2016, 6:14 PM

## 2016-02-18 NOTE — Discharge Instructions (Signed)

## 2016-02-18 NOTE — Discharge Summary (Signed)
OB Discharge Summary     Patient Name: Gail Perez DOB: 07/20/1991 MRN: [id number]  Date of admission: 02/15/2016 Delivering MD: Gail Perez   Date of discharge: 02/18/2016  Admitting diagnosis: INDUCTION Intrauterine pregnancy: 7052w1d     Secondary diagnosis:  Principal Problem:   NSVD (normal spontaneous vaginal delivery) Active Problems:   History of pregnancy induced hypertension   Echogenic intracardiac focus of fetus on prenatal ultrasound   H/O LEEP   Gestational hypertension without significant proteinuria  Additional problems: none     Discharge diagnosis: Term Pregnancy Delivered and Gestational Hypertension                                                                                                Post partum procedures:none  Augmentation: Pitocin and Cytotec  Complications: None  Hospital course:  Induction of Labor With Vaginal Delivery   25 y.o. yo [id number] at 7652w1d was admitted to the hospital 02/15/2016 for induction of labor.  Indication for induction: Postdates and Gestational hypertension, noted in labor. No s/s pre-e.  Patient had an uncomplicated labor course as follows: Membrane Rupture Time/Date: 4:14 AM ,02/16/2016   Intrapartum Procedures: Episiotomy: None [1]                                         Lacerations:  1st degree [2];Sulcus [9]  Patient had delivery of a Viable infant.  Information for the patient's newborn:  Gail Perez, Gail Perez [[id number]][[id number]]  Delivery Method: Vag-Spont   02/16/2016  Details of delivery can be found in separate delivery note.  Patient had a routine postpartum course with nl BPs. Patient is discharged home 02/18/2016.   Physical exam  Filed Vitals:   02/16/16 2017 02/17/16 0452 02/17/16 1750 02/18/16 0600  BP: 131/58 125/64 136/81 133/86  Pulse: 84 71 73 78  Temp: 98.2 F (36.8 C) 98.5 F (36.9 C) 98.3 F (36.8 C) 98.3 F (36.8 C)  TempSrc: Oral Oral Oral Oral  Resp: 18 18 18 18   Height:      Weight:       SpO2: 100% 100%     General: alert and cooperative Lochia: appropriate Uterine Fundus: firm Incision: N/A DVT Evaluation: No evidence of DVT seen on physical exam. Labs: Lab Results  Component Value Date   WBC 11.2* 02/16/2016   HGB 11.1* 02/16/2016   HCT 33.0* 02/16/2016   MCV 78.4 02/16/2016   PLT 231 02/16/2016   CMP Latest Ref Rng 02/15/2016  Glucose 65 - 99 mg/dL 83  BUN 6 - 20 mg/dL 7  Creatinine 4.030.44 - 4.741.00 mg/dL 2.590.78  Sodium 563135 - 875145 mmol/L 135  Potassium 3.5 - 5.1 mmol/L 3.8  Chloride 101 - 111 mmol/L 103  CO2 22 - 32 mmol/L 22  Calcium 8.9 - 10.3 mg/dL 6.4(P8.8(L)  Total Protein 6.5 - 8.1 g/dL 6.7  Total Bilirubin 0.3 - 1.2 mg/dL 0.7  Alkaline Phos 38 - 126 U/L 201(H)  AST 15 - 41 U/L 20  ALT 14 -  54 U/L 11(L)    Discharge instruction: per After Visit Summary and "Baby and Me Booklet".  After visit meds:    Medication List    STOP taking these medications        loratadine 10 MG tablet  Commonly known as:  CLARITIN      TAKE these medications        acetaminophen 325 MG tablet  Commonly known as:  TYLENOL  Take 2 tablets (650 mg total) by mouth every 4 (four) hours as needed (for pain scale < 4).        Diet: routine diet  Activity: Advance as tolerated. Pelvic rest for 6 weeks.   Outpatient follow up:6 weeks Follow up Appt:No future appointments. Follow up Visit:No Follow-up on file.  Postpartum contraception: Progesterone only pills  Newborn Data: Live born female  Birth Weight: 8 lb 1.1 oz (3660 g) APGAR: 8, 9  Baby Feeding: Bottle and Breast, but plans to just breastfeed at home Disposition:home with mother   02/18/2016 Gail Perez, CNM  02/18/2016

## 2016-02-19 ENCOUNTER — Ambulatory Visit: Payer: Self-pay

## 2016-02-19 NOTE — Lactation Note (Signed)
This note was copied from a baby's chart. Lactation Consultation Note  Patient Name: Gail Perez ZOXWR'UToday's Date: 02/19/2016 Reason for consult: Follow-up assessment;Other (Comment);Hyperbilirubinemia;Breast/nipple pain (baby on triple Photo tx , 1% weight loss , engorged )  Baby is 81 hours old , milk has been in and per mom when pumping noted small amount blood from the left nipple.  LC assessed breast with moms permission . LC noted a small cracking , and instructed mom to hand express , use her EBM, and comfort gels.  Mom is also engorged bilaterally firm to hard areas and full areas. LC prepared 3 ice packs and recommended laying back in bed in 30 degree or less angle  And ice on top of both breast, and the sides for 15 -20 mins. After icing pump for 15 -20 mins , increase flange to #27 if needed to prevent soreness.  LC reviewed and discussed supply and demand and the importance of consistent pumping both breast to protect establishing milk supply.  LC also mentioned if baby can receive consistent feedings with EBM it will enhance the Bilirubin deceasing.  LC recommended the baby be fed underneath the Triple photo tx with eye protectors so he  will receive consistent photo tx without interruption. Mom receptive to teaching. LC encouraged [facility] if needed or if continued issues with engorgement.     Maternal Data Has patient been taught Hand Expression?: Yes  Feeding Feeding Type: Formula Nipple Type: Slow - flow  LATCH Score/Interventions                Intervention(s): Breastfeeding basics reviewed     Lactation Tools Discussed/Used Tools: Pump;Comfort gels Breast pump type: Double-Electric Breast Pump   Consult Status Consult Status: Follow-up Date: 02/19/16 Follow-up type: In-patient    [name], Danyel Tobey Ann 02/19/2016, 2:58 PM

## 2016-02-20 ENCOUNTER — Ambulatory Visit: Payer: Self-pay

## 2016-02-20 NOTE — Lactation Note (Signed)
This note was copied from a baby's chart. Lactation Consultation Note  Patient Name: Gail Perez'UToday's Date: 02/20/2016 Reason for consult: Follow-up assessment Baby at 4 days of life. Mom is pumping to feed because her nipples are still sore. She stated the cracked nipple is getting better but she wants to wait until it is completely healed before latching baby. She plans to go home tomorrow and will make an OP apt for latch help next week. She has been heating the breast, pumping, then applying ice after pumping. She denies engorgement. She is aware of support group. She will call as needed.    Maternal Data    Feeding Feeding Type: Breast Milk Nipple Type: Slow - flow  LATCH Score/Interventions                      Lactation Tools Discussed/Used     Consult Status Consult Status: Follow-up Date: 02/21/16 Follow-up type: In-patient    Gail Perez 02/20/2016, 4:55 PM

## 2016-02-21 ENCOUNTER — Ambulatory Visit: Payer: Self-pay

## 2016-02-21 NOTE — Lactation Note (Signed)
This note was copied from a baby's chart. Lactation Consultation Note  Patient Name: Boy Carney Bernyisha Nickell JYNWG'NToday's Date: 02/21/2016 Reason for consult: Follow-up assessment  Baby is 535 days old and has been on photo tx , D/C this am and going home.  Per mom has a desire to breast feed and latch and milk is getting well established With plenty of milk . Baby's large wet changed and voided at diaper change. LC offered to assist with latch and mom receptive to re- latch. Baby latched for 6-7 mins , with multiply swallows.  And increased w / breast compressions. Baby released and was to fussy to re - latch. Mom finished the feeding with EBM in a  Bottle with flanged lips.  Mom well aware of engorgement prevention and tx. Per mom hasn't had engorgement since Friday and is pumping regularly when breast are  Feeling full. Per mom breast are comfortable today and sore nipples have improved with the comfort gels.  Mom already has shells , and a DEBP at home.  LC recommended when going home if she has a desire to latch the abby back to the breast , may need to give the baby what he has got'en used to  From a bottle , ( appetizer ) and then to the breast for latch. Important to take the baby calm, so he doesn't associate the breast with frustration.  LC also recommended calling back for LC O/p appt. If she desires to have the latch assessed. Mom receptive. Mother informed of post-discharge support and given phone number to the lactation department, including services for phone call assistance; out-patient appointments; and breastfeeding support group. List of other breastfeeding resources in the community given in the handout. Encouraged mother to call for problems or concerns related to breastfeeding.   Maternal Data Has patient been taught Hand Expression?: Yes  Feeding Feeding Type: Breast Fed Nipple Type: Slow - flow Length of feed: 6 min  LATCH Score/Interventions Latch: Grasps breast easily, tongue  down, lips flanged, rhythmical sucking. Intervention(s): Adjust position;Assist with latch;Breast compression;Breast massage  Audible Swallowing: Spontaneous and intermittent  Type of Nipple: Everted at rest and after stimulation  Comfort (Breast/Nipple): Filling, red/small blisters or bruises, mild/mod discomfort     Hold (Positioning): Assistance needed to correctly position infant at breast and maintain latch. Intervention(s): Breastfeeding basics reviewed;Support Pillows;Position options;Skin to skin  LATCH Score: 8  Lactation Tools Discussed/Used     Consult Status Consult Status: Complete Date: 02/21/16 Follow-up type: In-patient    [name], Justinn Welter Ann 02/21/2016, 11:47 AM

## 2016-02-26 ENCOUNTER — Emergency Department (HOSPITAL_BASED_OUTPATIENT_CLINIC_OR_DEPARTMENT_OTHER)
Admission: EM | Admit: 2016-02-26 | Discharge: 2016-02-26 | Disposition: A | Discharge status: Home / Self Care | Payer: BLUE CROSS/BLUE SHIELD | Attending: Emergency Medicine | Admitting: Emergency Medicine

## 2016-02-26 ENCOUNTER — Encounter (HOSPITAL_BASED_OUTPATIENT_CLINIC_OR_DEPARTMENT_OTHER): Payer: Self-pay

## 2016-02-26 DIAGNOSIS — R519 Headache, unspecified: Secondary | ICD-10-CM

## 2016-02-26 DIAGNOSIS — R51 Headache: Secondary | ICD-10-CM | POA: Insufficient documentation

## 2016-02-26 DIAGNOSIS — R109 Unspecified abdominal pain: Secondary | ICD-10-CM | POA: Insufficient documentation

## 2016-02-26 DIAGNOSIS — R030 Elevated blood-pressure reading, without diagnosis of hypertension: Secondary | ICD-10-CM | POA: Diagnosis not present

## 2016-02-26 DIAGNOSIS — R509 Fever, unspecified: Secondary | ICD-10-CM | POA: Diagnosis not present

## 2016-02-26 LAB — URINE MICROSCOPIC-ADD ON

## 2016-02-26 LAB — URINALYSIS, ROUTINE W REFLEX MICROSCOPIC
GLUCOSE, UA: NEGATIVE mg/dL
KETONES UR: 15 mg/dL — AB
Nitrite: NEGATIVE
PH: 5.5 (ref 5.0–8.0)
Protein, ur: NEGATIVE mg/dL
Specific Gravity, Urine: 1.038 — ABNORMAL HIGH (ref 1.005–1.030)

## 2016-02-26 NOTE — ED Notes (Signed)
Pt reports fever that started today. Sts she has taken tylenol and ibuprofen today. Does report mild headache. Pt is 1 week post partum, full term delivery. No complications.

## 2016-02-26 NOTE — Discharge Instructions (Signed)
General Headache Without Cause Take Tylenol as directed every 4 hours as needed for headache. Get your blood pressure rechecked within the next 3 weeks. Today's was mildly elevated at 150/88. Return if concerned for any reason or see your doctor.  A headache is pain or discomfort felt around the head or neck area. There are many causes and types of headaches. In some cases, the cause may not be found.  HOME CARE  Managing Pain  Take over-the-counter and prescription medicines only as told by your doctor.  Lie down in a dark, quiet room when you have a headache.  If directed, apply ice to the head and neck area:  Put ice in a plastic bag.  Place a towel between your skin and the bag.  Leave the ice on for 20 minutes, 2-3 times per day.  Use a heating pad or hot shower to apply heat to the head and neck area as told by your doctor.  Keep lights dim if bright lights bother you or make your headaches worse. Eating and Drinking  Eat meals on a regular schedule.  Lessen how much alcohol you drink.  Lessen how much caffeine you drink, or stop drinking caffeine. General Instructions  Keep all follow-up visits as told by your doctor. This is important.  Keep a journal to find out if certain things bring on headaches. For example, write down:  What you eat and drink.  How much sleep you get.  Any change to your diet or medicines.  Relax by getting a massage or doing other relaxing activities.  Lessen stress.  Sit up straight. Do not tighten (tense) your muscles.  Do not use tobacco products. This includes cigarettes, chewing tobacco, or e-cigarettes. If you need help quitting, ask your doctor.  Exercise regularly as told by your doctor.  Get enough sleep. This often means 7-9 hours of sleep. GET HELP IF:  Your symptoms are not helped by medicine.  You have a headache that feels different than the other headaches.  You feel sick to your stomach (nauseous) or you throw  up (vomit).  You have a fever. GET HELP RIGHT AWAY IF:   Your headache becomes really bad.  You keep throwing up.  You have a stiff neck.  You have trouble seeing.  You have trouble speaking.  You have pain in the eye or ear.  Your muscles are weak or you lose muscle control.  You lose your balance or have trouble walking.  You feel like you will pass out (faint) or you pass out.  You have confusion.   This information is not intended to replace advice given to you by your health care provider. Make sure you discuss any questions you have with your health care provider.   Document Released: 05/17/2008 Document Revised: 04/29/2015 Document Reviewed: 12/01/2014 Elsevier Interactive Patient Education [employer]2016 Elsevier Inc.

## 2016-02-26 NOTE — ED Notes (Signed)
Pt attempting to obtain urine specimen at this time.

## 2016-02-26 NOTE — ED Notes (Signed)
MD at bedside. 

## 2016-02-26 NOTE — ED Provider Notes (Addendum)
CSN: [id number]     Arrival date & time 02/26/16  1641 History   First MD Initiated Contact with Patient 02/26/16 1651     Chief Complaint  Patient presents with  . Fever     (Consider location/radiation/quality/duration/timing/severity/associated sxs/prior Treatment) HPI Patient had temperature of 101.2 noted at 1:15 PM today. She presently complains of bitemporal nonradiating headache which is moderate. Not made better or worse by anything no other associated symptoms. She did have mild abdominal discomfort earlier today which has resolved. Last bowel movement this afternoon, normal she denies urinary symptoms denies vaginal discharge. She is one week postpartum term vaginal delivery, without complication. She treated herself with ibuprofen and Tylenol earlier today. Nothing makes symptoms better or worse. No other associated symptoms. Denies sore throat denies cough denies shortness of breath denies breast pain abdominal pain has resolved. Past Medical History  Diagnosis Date  . Seasonal allergies   . Urinary tract infection   . Hx of varicella   . Chlamydia   . Allergy   . Vaginal Pap smear, abnormal   . Pregnancy induced hypertension 2014   Past Surgical History  Procedure Laterality Date  . Leep     Family History  Problem Relation Age of Onset  . Other Neg Hx   . Hypertension Father   . Hyperlipidemia Father   . Heart disease Father     enlatged heart  . Sarcoidosis Father   . Cancer Maternal Grandfather     lung  . Anemia Mother   . Hypertension Sister    Social History  Substance Use Topics  . Smoking status: Never Smoker   . Smokeless tobacco: Never Used  . Alcohol Use: No     Comment: occasional   OB History    Gravida Para Term Preterm AB TAB SAB Ectopic Multiple Living   2 2 2  0 0 0 0 0 0 2     Review of Systems  Constitutional: Positive for fever.  Respiratory: Negative.   Cardiovascular: Negative.   Gastrointestinal: Positive for abdominal pain.   Endocrine: Negative.        Currently breast-feeding  Musculoskeletal: Negative.   Skin: Negative.   Neurological: Positive for headaches.  Psychiatric/Behavioral: Negative.   All other systems reviewed and are negative.     Allergies  Review of patient's allergies indicates no known allergies.  Home Medications   Prior to Admission medications   Medication Sig Start Date End Date Taking? Authorizing Provider  acetaminophen (TYLENOL) 325 MG tablet Take 2 tablets (650 mg total) by mouth every 4 (four) hours as needed (for pain scale < 4). 02/18/16   Arabella MerlesKimberly D Shaw, CNM   BP 138/98 mmHg  Pulse 107  Temp(Src) 99.2 F (37.3 C) (Oral)  Resp 16  Ht 5\' 9"  (1.753 m)  Wt 209 lb (94.802 kg)  BMI 30.85 kg/m2  SpO2 100% Physical Exam  Constitutional: She is oriented to person, place, and time. She appears well-developed and well-nourished. No distress.  HENT:  Head: Normocephalic and atraumatic.  Eyes: Conjunctivae are normal. Pupils are equal, round, and reactive to light.  Fundi benign  Neck: Neck supple. No tracheal deviation present. No thyromegaly present.  Cardiovascular: Normal rate and regular rhythm.   No murmur heard. Pulmonary/Chest: Effort normal and breath sounds normal.  Abdominal: Soft. Bowel sounds are normal. She exhibits no distension. There is no tenderness.  Genitourinary:  No flank tenderness  Musculoskeletal: Normal range of motion. She exhibits no edema or tenderness.  Neurological: She is alert and oriented to person, place, and time. No cranial nerve deficit. Coordination normal.  Gait normal  Skin: Skin is warm and dry. No rash noted.  Psychiatric: She has a normal mood and affect.  Nursing note and vitals reviewed.   ED Course  Procedures (including critical care time) Labs Review Labs Reviewed - No data to display  Imaging Review No results found. I have personally reviewed and evaluated these images and lab results as part of my medical  decision-making.   EKG Interpretation None     6:10 PM patient feels well. Headache is continuing to improve. No abdominal pain. Results for orders placed or performed during the hospital encounter of 02/26/16  Urinalysis, Routine w reflex microscopic (not [facility])  Result Value Ref Range   Color, Urine AMBER (A) YELLOW   APPearance CLOUDY (A) CLEAR   Specific Gravity, Urine 1.038 (H) 1.005 - 1.030   pH 5.5 5.0 - 8.0   Glucose, UA NEGATIVE NEGATIVE mg/dL   Hgb urine dipstick MODERATE (A) NEGATIVE   Bilirubin Urine SMALL (A) NEGATIVE   Ketones, ur 15 (A) NEGATIVE mg/dL   Protein, ur NEGATIVE NEGATIVE mg/dL   Nitrite NEGATIVE NEGATIVE   Leukocytes, UA MODERATE (A) NEGATIVE  Urine microscopic-add on  Result Value Ref Range   Squamous Epithelial / LPF 0-5 (A) NONE SEEN   WBC, UA 6-30 0 - 5 WBC/hpf   RBC / HPF 6-30 0 - 5 RBC/hpf   Bacteria, UA FEW (A) NONE SEEN   Urine-Other MUCOUS PRESENT    [place] Mfm Fetal Bpp Wo Non Stress  02/12/2016  OBSTETRICAL ULTRASOUND: This exam was performed within a  Ultrasound Department. The OB US report was generated in the AS system, and faxed to the ordering physician.  This report is available in the [facility] PACS. See the AS Obstetric US report via the Image Link.   MDM  Plan urine sent for culture. Doubt UTI. Patient has no urinary symptoms. No signs of encephalopathy or meningitis. Patient well-appearing Plan Tylenol. Blood pressure recheck 3 weeks. Diagnosis #1 nonspecific headache #2 fever by history #3 elevated blood pressure Final diagnoses:  None        Doug SouSam Khale Nigh, MD 02/26/16 1821  Doug SouSam Wilmina Maxham, MD 02/26/16 [id number]

## 2016-02-26 NOTE — ED Notes (Signed)
Patient given a gown and asked to undress placing gown on opening in the back.

## 2016-02-28 LAB — URINE CULTURE: SPECIAL REQUESTS: NORMAL

## 2016-04-06 ENCOUNTER — Ambulatory Visit (INDEPENDENT_AMBULATORY_CARE_PROVIDER_SITE_OTHER): Payer: BLUE CROSS/BLUE SHIELD | Admitting: Advanced Practice Midwife

## 2016-04-06 ENCOUNTER — Encounter: Payer: Self-pay | Admitting: Advanced Practice Midwife

## 2016-04-06 DIAGNOSIS — Z3202 Encounter for pregnancy test, result negative: Secondary | ICD-10-CM

## 2016-04-06 DIAGNOSIS — Z3009 Encounter for other general counseling and advice on contraception: Secondary | ICD-10-CM

## 2016-04-06 LAB — POCT PREGNANCY, URINE: PREG TEST UR: NEGATIVE

## 2016-04-06 MED ORDER — NORETHIN ACE-ETH ESTRAD-FE 1-20 MG-MCG(24) PO TABS: 1.0000 | ORAL_TABLET | Freq: Every day | ORAL | 11 refills | Status: DC

## 2016-04-06 NOTE — Progress Notes (Signed)
Subjective:     Gail Perez is a 25 y.o. female who presents for a postpartum visit. She is 7 weeks postpartum following a spontaneous vaginal delivery. I have fully reviewed the prenatal and intrapartum course. The delivery was at 325w1d gestational weeks. Outcome: spontaneous vaginal delivery. Anesthesia: spinal. Postpartum course has been benign. Baby's course has been benign. Baby is feeding by bottle - Similac Advance. Bleeding no bleeding. Bowel function is normal. Bladder function is normal. Patient is sexually active. Contraception method is condoms. Postpartum depression screening: negative. Patient has regular menstrual periods lasting 7 days with heavy bleeding the first 1-2 days of her cycle. No spotting between menstrual periods.   The following portions of the patient's history were reviewed and updated as appropriate: allergies, current medications, past family history, past medical history, past social history, past surgical history and problem list.  Review of Systems Pertinent items noted in HPI and remainder of comprehensive ROS otherwise negative.   Objective:    BP 132/86   Pulse 71   Ht 5\' 9"  (1.753 m)   Wt 200 lb 12.8 oz (91.1 kg)   Breastfeeding? No   BMI 29.65 kg/m   General:  alert, cooperative and no distress   VS reviewed, nursing note reviewed,  Constitutional: well developed, well nourished, no distress HEENT: normocephalic CV: normal rate Pulm/chest wall: normal effort Abdomen: soft Neuro: alert and oriented x 3 Skin: warm, dry Psych: affect normal  Pelvic exam deferred  Assessment:     Benign postpartum exam. Pap smear not done at today's visit.   Plan:   1. General counseling and advice on contraceptive management -counseled patient on LARC methods, patient decided to use OCPs as these have worked well for her in the past.  Prefers low dose pills. - Norethindrone Acetate-Ethinyl Estrad-FE (LOESTRIN 24 FE) 1-20 MG-MCG(24) tablet; Take 1 tablet by  mouth daily.  Dispense: 1 Package; Refill: 11  2. Postpartum examination following vaginal delivery -patient without any concerns s/p SVD   Follow up in: 1 year for annual exam or as needed.    Marcy Sirenatherine Wallace, D.O. 04/06/2016, 11:01 AM PGY-2, [facility]  CNM attestation:  I have seen and examined this patient; I agree with above documentation in the resdient's note.   Gail Perez is a 25 y.o. G2P2002 in office for routine postpartum visit at 7 weeks following NSVD.  She is sexually active and denies complications or problems today.   PE: BP 132/86   Pulse 71   Ht 5\' 9"  (1.753 m)   Wt 200 lb 12.8 oz (91.1 kg)   Breastfeeding? No   BMI 29.65 kg/m  Gen: calm comfortable, NAD Resp: normal effort, no distress Abd: soft  ROS, labs, PMH reviewed   Plan: OCPs per pt preference sent to pharmacy F/U in 1 year or PRN  LEFTWICH-KIRBY, Lucyle Alumbaugh, CNM 11:54 AM

## 2016-05-25 ENCOUNTER — Telehealth: Payer: Self-pay | Admitting: *Deleted

## 2016-05-25 DIAGNOSIS — Z3009 Encounter for other general counseling and advice on contraception: Secondary | ICD-10-CM

## 2016-05-25 MED ORDER — NORETHIN ACE-ETH ESTRAD-FE 1-20 MG-MCG(24) PO TABS: 1.0000 | ORAL_TABLET | Freq: Every day | ORAL | 4 refills | Status: DC

## 2016-05-25 NOTE — Telephone Encounter (Signed)
Pharmacy called stating the patient is requesting her oral contraceptive be changed to a 90 day supply.

## 2016-07-18 ENCOUNTER — Telehealth: Payer: Self-pay | Admitting: Medical

## 2016-07-18 NOTE — Telephone Encounter (Signed)
Patient called stating that she is having chest pain and pressure. Transferred to [facility].

## 2016-07-18 NOTE — Telephone Encounter (Signed)
TELEPHONE ADVICE RECORD [facility]  Patient Name: Gail Perez  DOB: 1990/09/24    Initial Comment Caller states chest pain and pressure   Nurse Assessment  Nurse: Odis LusterBowers, RN, Bjorn Loserhonda Date/Time Lamount Cohen(Eastern Time): 07/18/2016 4:49:55 PM  Confirm and document reason for call. If symptomatic, describe symptoms. You must click the next button to save text entered. ---Caller states chest pain and pressure. Reports that this is intermittent pain.  Does the patient have any new or worsening symptoms? ---Yes  Will a triage be completed? ---Yes  Related visit to physician within the last 2 weeks? ---No  Does the PT have any chronic conditions? (i.e. diabetes, asthma, etc.) ---No  Is the patient pregnant or possibly pregnant? (Ask all females between the ages of 4312-55) ---No  Is this a behavioral health or substance abuse call? ---No     Guidelines    Guideline Title Affirmed Question Affirmed Notes  Chest Pain [1] Chest pain lasts > 5 minutes AND [2] described as crushing, pressure-like, or heavy    Final Disposition User   Call EMS 911 Now Odis LusterBowers, RN, Bjorn Loserhonda    Disagree/Comply: Danella Maiersomply

## 2016-07-19 NOTE — Telephone Encounter (Signed)
Called to follow up with patient.  Pt states she is feeling much better today.  States yesterday she did not call 911 or go to the ER.  Instead, she took ASA and rested and later felt better.  She thinks she may have had indigestion.  States eating a steak and cheese the night before and this could have caused her indigestion/acid reflux as she experienced this before in the past.  She denied chest pain, shortness of breath, arm pain, jaw pain, back pain, n/v, dizziness, and weakness at time of call.    Pt was encouraged if symptoms return to go to the ER or at least come in to be seen.  Pt stated understanding and agreed to comply.

## 2016-11-28 ENCOUNTER — Encounter: Payer: Self-pay | Admitting: Medical

## 2016-12-01 ENCOUNTER — Encounter: Payer: Self-pay | Admitting: Medical

## 2016-12-01 ENCOUNTER — Ambulatory Visit (INDEPENDENT_AMBULATORY_CARE_PROVIDER_SITE_OTHER): Payer: BLUE CROSS/BLUE SHIELD | Admitting: Medical

## 2016-12-01 VITALS — Ht 69.0 in | Wt 188.4 lb

## 2016-12-01 DIAGNOSIS — K219 Gastro-esophageal reflux disease without esophagitis: Secondary | ICD-10-CM

## 2016-12-01 DIAGNOSIS — R112 Nausea with vomiting, unspecified: Secondary | ICD-10-CM

## 2016-12-01 LAB — POCT URINE PREGNANCY: PREG TEST UR: NEGATIVE

## 2016-12-01 MED ORDER — OMEPRAZOLE 20 MG PO CPDR: 20.0000 mg | DELAYED_RELEASE_CAPSULE | Freq: Every day | ORAL | 1 refills | Status: DC

## 2016-12-01 MED ORDER — ONDANSETRON 8 MG PO TBDP: 8.0000 mg | ORAL_TABLET | Freq: Three times a day (TID) | ORAL | 0 refills | Status: DC | PRN

## 2016-12-01 NOTE — Progress Notes (Signed)
Subjective:    Patient ID: Gail Perez, female    DOB: November 23, 1990, 26 y.o.   MRN: [id number]  HPI  Pt in for some intermittent episodes of random light headed, dizzy, nausea and vomiting. Happens about one time a week with brief symptoms(will vomit just one time). States next day will not associate with meals. Sometimes after meals other times not associated with any food. Pt does have heart burn all the time. She states has heart burn for years since teenager. She stats when she was pregnant she had severe reflux. After she delivered child in June and her stomach felt better. She stopped med for gerd. OB had prescribed  a med but she is not sure what name of med was. It did help with reflux during pregnancy.  LMP- November 03, 2016. Pt was taking birth control but she stopped pills.(Pt is supposed to schedule with gyn to discuss other option since 2 types could not tolerate)  Pt no longer breast feeding her child.  Pt states she is eating healthy, dieting and exercising. She has lost 20 lbs.    Review of Systems  Constitutional: Negative for chills and fatigue.  Respiratory: Negative for cough, chest tightness, shortness of breath and wheezing.   Cardiovascular: Negative for chest pain and palpitations.  Gastrointestinal: Positive for nausea. Negative for abdominal pain, blood in stool, constipation, diarrhea and rectal pain.  Genitourinary: Negative for difficulty urinating, dysuria, enuresis, frequency, hematuria, pelvic pain and urgency.  Musculoskeletal: Negative for back pain.  Skin: Negative for rash.  Neurological: Negative for dizziness and headaches.       See hpi.  Hematological: Negative for adenopathy. Does not bruise/bleed easily.  Psychiatric/Behavioral: Negative for behavioral problems and confusion.    Past Medical History:  Diagnosis Date  . Allergy   . Chlamydia   . Hx of varicella   . Pregnancy induced hypertension 2014  . Seasonal allergies   . Urinary tract  infection   . Vaginal Pap smear, abnormal      Social History   Social History  . Marital status: Single    Spouse name: N/A  . Number of children: N/A  . Years of education: N/A   Occupational History  . Not on file.   Social History Main Topics  . Smoking status: Never Smoker  . Smokeless tobacco: Never Used  . Alcohol use No     Comment: occasional  . Drug use: No  . Sexual activity: Yes    Birth control/ protection: Condom   Other Topics Concern  . Not on file   Social History Narrative  . No narrative on file    Past Surgical History:  Procedure Laterality Date  . LEEP      Family History  Problem Relation Age of Onset  . Hypertension Father   . Hyperlipidemia Father   . Heart disease Father     enlatged heart  . Sarcoidosis Father   . Anemia Mother   . Cancer Maternal Grandfather     lung  . Hypertension Sister   . Other Neg Hx     No Known Allergies  No current outpatient prescriptions on file prior to visit.   No current facility-administered medications on file prior to visit.     Ht  (1.753 m)   Wt 188 lb 6.4 oz (85.5 kg)   LMP 11/03/2016   BMI 27.82 kg/m       Objective:   Physical Exam  General Mental Status-  Alert. General Appearance- Not in acute distress.   Skin General: Color- Normal Color. Moisture- Normal Moisture.  Neck Carotid Arteries- Normal color. Moisture- Normal Moisture. No carotid bruits. No JVD.  Chest and Lung Exam Auscultation: Breath Sounds:-Normal.  Cardiovascular Auscultation:Rythm- Regular. Murmurs & Other Heart Sounds:Auscultation of the heart reveals- No Murmurs.  Abdomen Inspection:-Inspeection Normal. Palpation/Percussion:Note:No mass. Palpation and Percussion of the abdomen reveal- Non Tender, Non Distended + BS, no rebound or guarding.    Neurologic Cranial Nerve exam:- CN III-XII intact(No nystagmus), symmetric smile. Strength:- 5/5 equal and symmetric strength both upper and  lower extremities.  Back- no cva pain      Assessment & Plan:  For your history of gerd and nausea will rx omeprazole and rx zofran.  Future labs to be done tomorrow or Monday.  If labs negative and no response to med then will refer to GI for probable egd.  Follow up 2-3 weeks or as needed  I asked pt to call and expidit her ob appointment. Also I asked her if she is late for menses check to see if pregnant.  Pregnancy test today was negative.   Reymond Maynez, Ramon Dredge, PA-C

## 2016-12-01 NOTE — Progress Notes (Signed)
Pre visit review using our clinic review tool, if applicable. No additional management support is needed unless otherwise documented below in the visit note. 

## 2016-12-01 NOTE — Patient Instructions (Signed)
For your history of gerd and nausea will rx omeprazole and rx zofran.  Future labs to be done tomorrow or Monday.  If labs negative and no response to med then will refer to GI for probable egd.  Follow up 2-3 weeks or as needed

## 2017-01-10 ENCOUNTER — Encounter: Payer: Self-pay | Admitting: Family Medicine

## 2017-01-10 ENCOUNTER — Ambulatory Visit (INDEPENDENT_AMBULATORY_CARE_PROVIDER_SITE_OTHER): Payer: BLUE CROSS/BLUE SHIELD | Admitting: Family Medicine

## 2017-01-10 VITALS — BP 118/78 | HR 76 | Temp 98.5°F | Resp 16 | Ht 69.0 in | Wt 187.4 lb

## 2017-01-10 DIAGNOSIS — N390 Urinary tract infection, site not specified: Secondary | ICD-10-CM | POA: Diagnosis not present

## 2017-01-10 DIAGNOSIS — R300 Dysuria: Secondary | ICD-10-CM | POA: Diagnosis not present

## 2017-01-10 DIAGNOSIS — R350 Frequency of micturition: Secondary | ICD-10-CM

## 2017-01-10 LAB — POC URINALSYSI DIPSTICK (AUTOMATED)
Bilirubin, UA: NEGATIVE
GLUCOSE UA: NEGATIVE
Ketones, UA: NEGATIVE
Nitrite, UA: POSITIVE
Urobilinogen, UA: 0.2 E.U./dL
pH, UA: 6 (ref 5.0–8.0)

## 2017-01-10 LAB — POCT URINE PREGNANCY: Preg Test, Ur: NEGATIVE

## 2017-01-10 MED ORDER — CIPROFLOXACIN HCL 250 MG PO TABS: 250.0000 mg | ORAL_TABLET | Freq: Two times a day (BID) | ORAL | 0 refills | Status: DC

## 2017-01-10 NOTE — Patient Instructions (Signed)

## 2017-01-10 NOTE — Progress Notes (Signed)
Patient ID: Gail Perez, female   DOB: [birth year], 26 y.o.   MRN: [id number]     Subjective:  I acted as a Neurosurgeon for Dr. Zola Button.  Gail Perez, CMA   Patient ID: Gail Perez, female    DOB: 06-21-1991, 26 y.o.   MRN: [id number]  Chief Complaint  Patient presents with  . Dysuria  . Urinary Frequency    Dysuria   This is a new problem. Episode onset: last night. Associated symptoms include frequency. Pertinent negatives include no flank pain or vomiting. She has tried nothing for the symptoms.  Urinary Frequency   Associated symptoms include frequency. Pertinent negatives include no flank pain or vomiting.    Patient is in today for dysuria and urinary frequency.   Patient Care Team: Saguier, Kateri Mc as PCP - General (Internal Medicine)   Past Medical History:  Diagnosis Date  . Allergy   . Chlamydia   . Hx of varicella   . Pregnancy induced hypertension 2014  . Seasonal allergies   . Urinary tract infection   . Vaginal Pap smear, abnormal     Past Surgical History:  Procedure Laterality Date  . LEEP      Family History  Problem Relation Age of Onset  . Hypertension Father   . Hyperlipidemia Father   . Heart disease Father        enlatged heart  . Sarcoidosis Father   . Anemia Mother   . Cancer Maternal Grandfather        lung  . Hypertension Sister   . Other Neg Hx     Social History   Social History  . Marital status: Single    Spouse name: N/A  . Number of children: N/A  . Years of education: N/A   Occupational History  . Not on file.   Social History Main Topics  . Smoking status: Never Smoker  . Smokeless tobacco: Never Used  . Alcohol use No     Comment: occasional  . Drug use: No  . Sexual activity: Yes    Birth control/ protection: Condom   Other Topics Concern  . Not on file   Social History Narrative  . No narrative on file    Outpatient Medications Prior to Visit  Medication Sig Dispense Refill  . omeprazole (PRILOSEC)  20 MG capsule Take 1 capsule (20 mg total) by mouth daily. 30 capsule 1  . ondansetron (ZOFRAN ODT) 8 MG disintegrating tablet Take 1 tablet (8 mg total) by mouth every 8 (eight) hours as needed for nausea or vomiting. 20 tablet 0   No facility-administered medications prior to visit.     No Known Allergies  Review of Systems  Constitutional: Negative for fever and malaise/fatigue.  HENT: Negative for congestion.   Eyes: Negative for blurred vision.  Respiratory: Negative for cough and shortness of breath.   Cardiovascular: Negative for chest pain, palpitations and leg swelling.  Gastrointestinal: Negative for vomiting.  Genitourinary: Positive for dysuria and frequency. Negative for flank pain.  Musculoskeletal: Negative for back pain.  Skin: Negative for rash.  Neurological: Negative for loss of consciousness and headaches.       Objective:    Physical Exam  Constitutional: She is oriented to person, place, and time. She appears well-developed and well-nourished. No distress.  HENT:  Head: Normocephalic and atraumatic.  Eyes: Conjunctivae are normal.  Neck: Normal range of motion. No thyromegaly present.  Cardiovascular: Normal rate and regular rhythm.   Pulmonary/Chest: Effort normal and breath  sounds normal. She has no wheezes.  Abdominal: Soft. Bowel sounds are normal. There is tenderness in the suprapubic area.  Musculoskeletal: Normal range of motion. She exhibits no edema or deformity.  Neurological: She is alert and oriented to person, place, and time.  Skin: Skin is warm and dry. She is not diaphoretic.  Psychiatric: She has a normal mood and affect.  Vitals reviewed.   BP 118/78 (BP Location: Right Arm, Cuff Size: Normal)   Pulse 76   Temp 98.5 F (36.9 C) (Oral)   Resp 16   Ht 5\' 9"  (1.753 m)   Wt 187 lb 6.4 oz (85 kg)   LMP 01/10/2017   SpO2 93%   BMI 27.67 kg/m  Wt Readings from Last 3 Encounters:  01/10/17 187 lb 6.4 oz (85 kg)  12/01/16 188 lb 6.4  oz (85.5 kg)  04/06/16 200 lb 12.8 oz (91.1 kg)   BP Readings from Last 3 Encounters:  01/10/17 118/78  04/06/16 132/86  02/26/16 150/88     Immunization History  Administered Date(s) Administered  . Tdap 12/08/2015    Health Maintenance  Topic Date Due  . INFLUENZA VACCINE  03/22/2017  . PAP SMEAR  04/22/2018  . TETANUS/TDAP  12/07/2025  . HIV Screening  Completed    Lab Results  Component Value Date   WBC 11.2 (H) 02/16/2016   HGB 11.1 (L) 02/16/2016   HCT 33.0 (L) 02/16/2016   PLT 231 02/16/2016   GLUCOSE 83 02/15/2016   ALT 11 (L) 02/15/2016   AST 20 02/15/2016   NA 135 02/15/2016   K 3.8 02/15/2016   CL 103 02/15/2016   CREATININE 0.78 02/15/2016   BUN 7 02/15/2016   CO2 22 02/15/2016    No results found for: TSH Lab Results  Component Value Date   WBC 11.2 (H) 02/16/2016   HGB 11.1 (L) 02/16/2016   HCT 33.0 (L) 02/16/2016   MCV 78.4 02/16/2016   PLT 231 02/16/2016   Lab Results  Component Value Date   NA 135 02/15/2016   K 3.8 02/15/2016   CO2 22 02/15/2016   GLUCOSE 83 02/15/2016   BUN 7 02/15/2016   CREATININE 0.78 02/15/2016   BILITOT 0.7 02/15/2016   ALKPHOS 201 (H) 02/15/2016   AST 20 02/15/2016   ALT 11 (L) 02/15/2016   PROT 6.7 02/15/2016   ALBUMIN 2.8 (L) 02/15/2016   CALCIUM 8.8 (L) 02/15/2016   ANIONGAP 10 02/15/2016   No results found for: CHOL No results found for: HDL No results found for: LDLCALC No results found for: TRIG No results found for: CHOLHDL No results found for: ZOXW9UHGBA1C       Assessment & Plan:   Problem List Items Addressed This Visit    None    Visit Diagnoses    Urinary tract infection without hematuria, site unspecified    -  Primary   Relevant Medications   ciprofloxacin (CIPRO) 250 MG tablet   Dysuria       Relevant Orders   POCT Urinalysis Dipstick (Automated) (Completed)   Urine culture   Urinary frequency       Relevant Orders   POCT Urinalysis Dipstick (Automated) (Completed)   Urine  culture   POCT urine pregnancy (Completed)      I am having Gail Perez start on ciprofloxacin. I am also having her maintain her omeprazole and ondansetron.  Meds ordered this encounter  Medications  . ciprofloxacin (CIPRO) 250 MG tablet    Sig: Take 1  tablet (250 mg total) by mouth 2 (two) times daily.    Dispense:  10 tablet    Refill:  0    CMA served as scribe during this visit. History, Physical and Plan performed by medical provider. Documentation and orders reviewed and attested to.  Donato Schultz, DO

## 2017-01-12 LAB — URINE CULTURE

## 2017-03-16 IMAGING — US US MFM OB COMP +14 WKS
1 series · 14 of 28 positions shown · non-contrast
Comparison: none

[Series 1: us mfm ob comp +14 wks · 56 acquisitions, 14 frames shown]
[im 3/56]
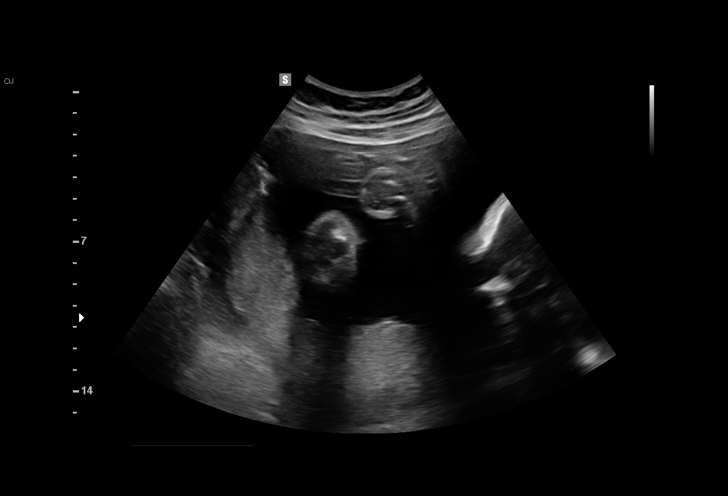
[im 7/56]
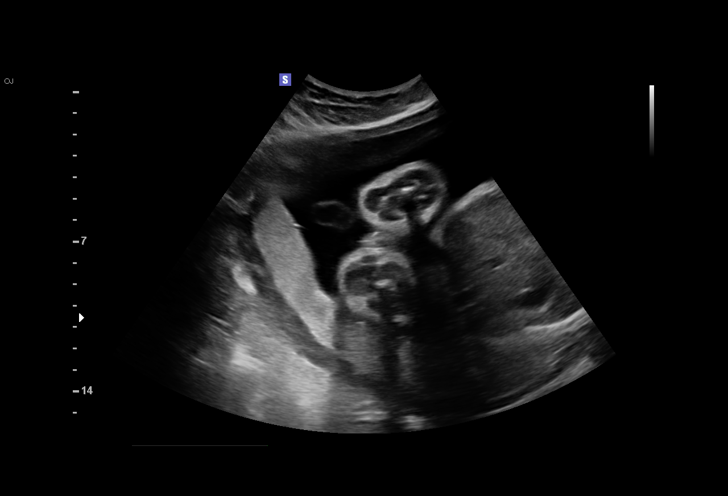
[im 11/56]
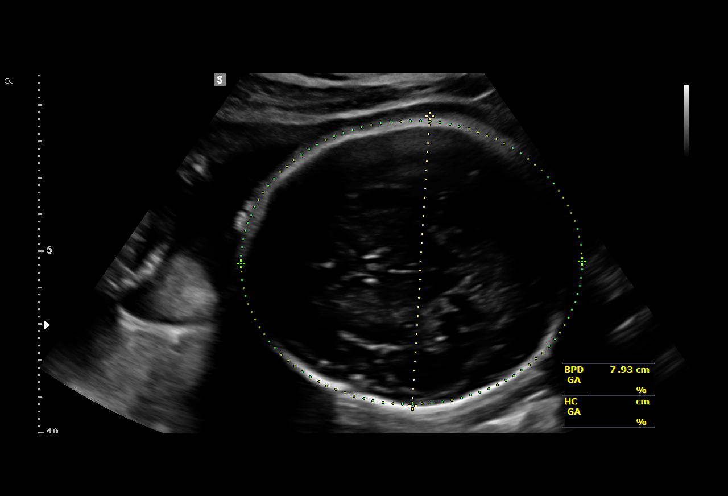
[im 15/56]
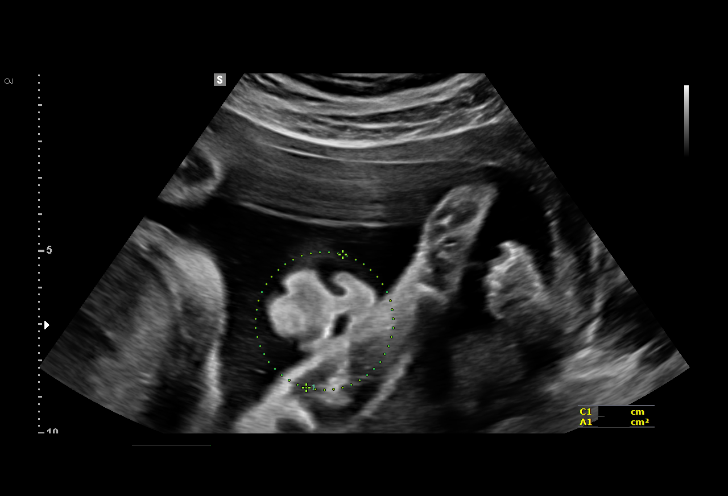
[im 19/56]
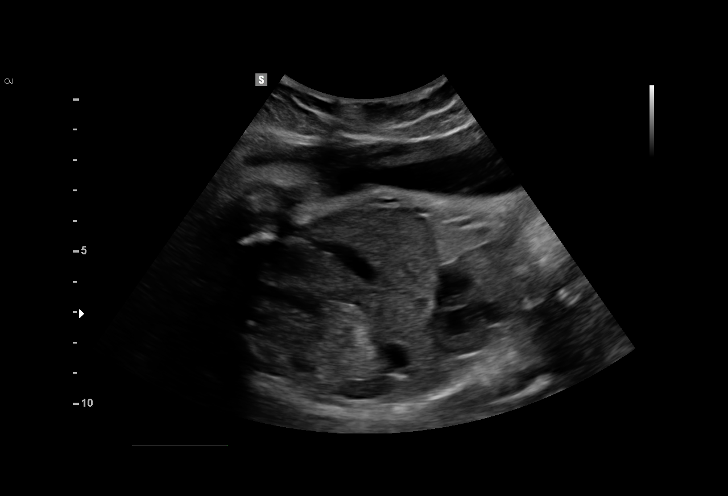
[im 23/56]
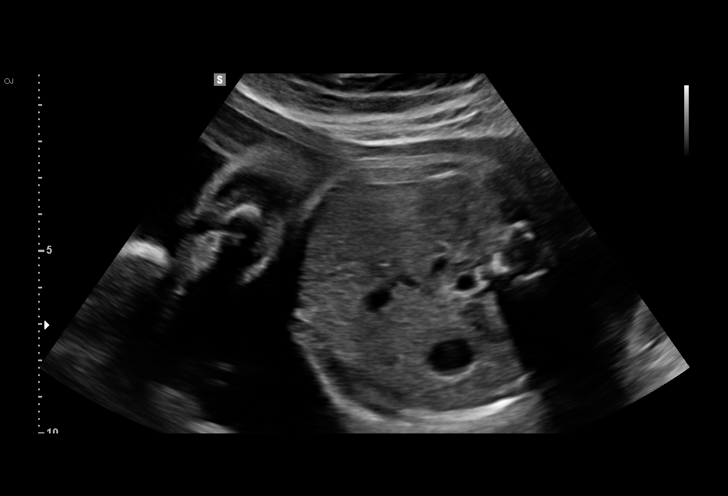
[im 27/56]
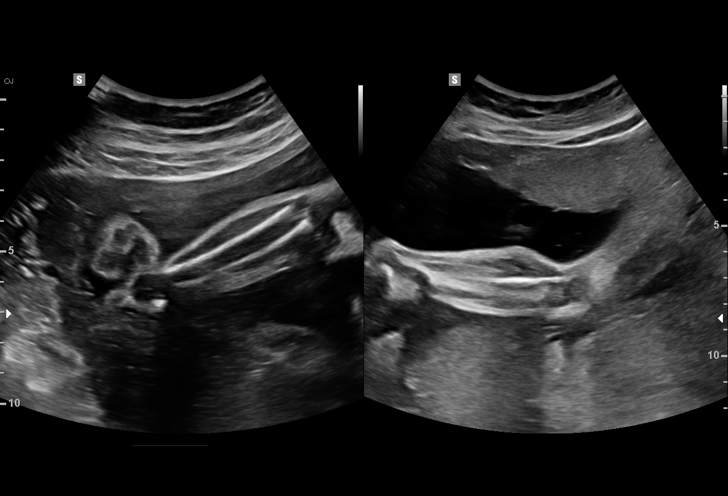
[im 31/56]
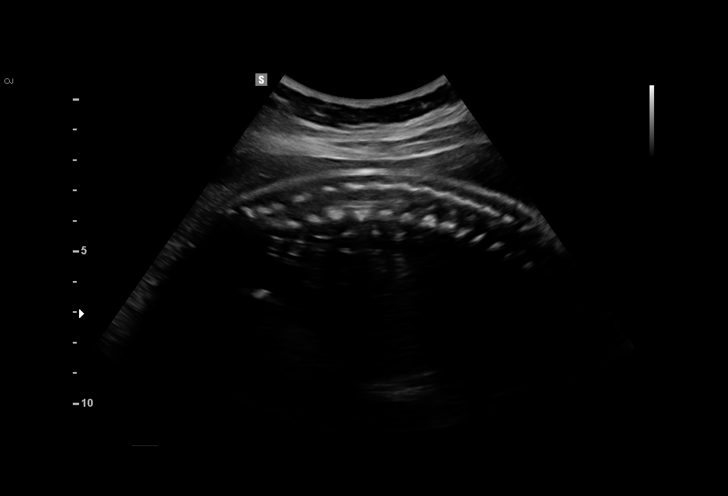
[im 35/56]
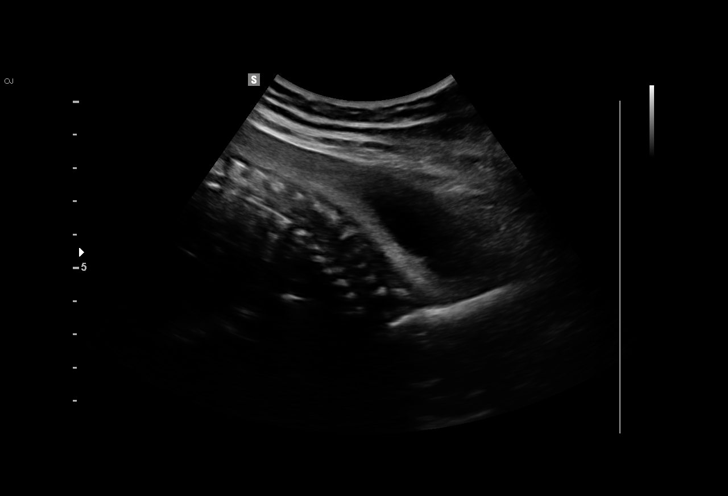
[im 39/56]
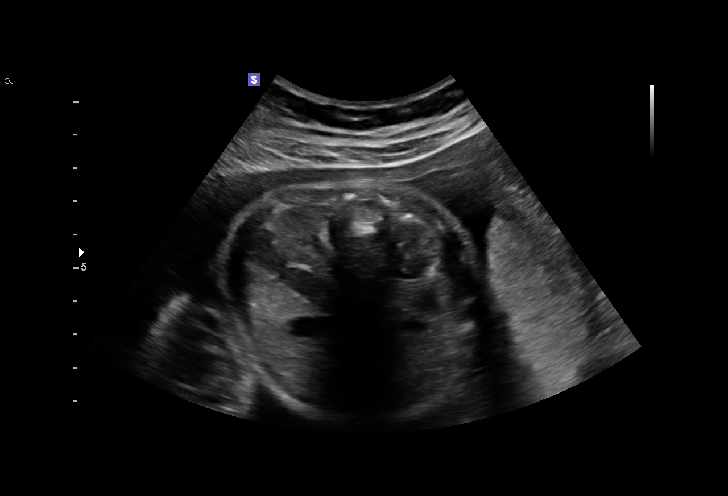
[im 43/56]
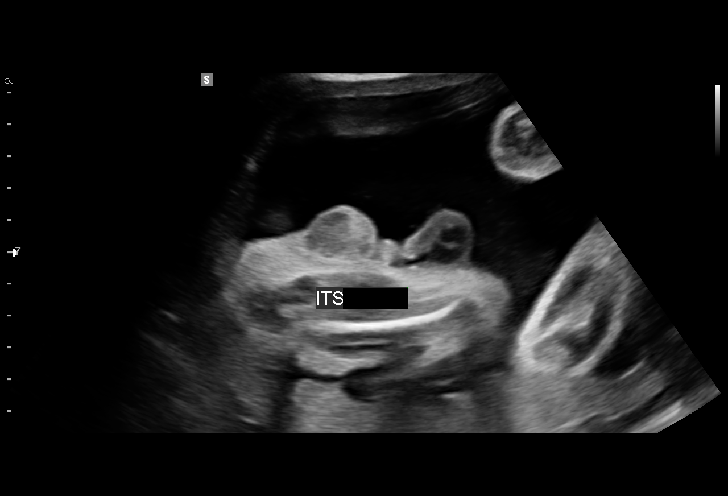
[im 47/56]
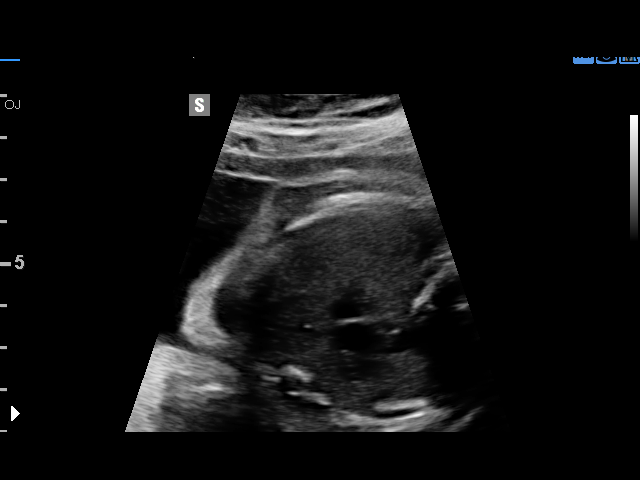
[im 51/56]
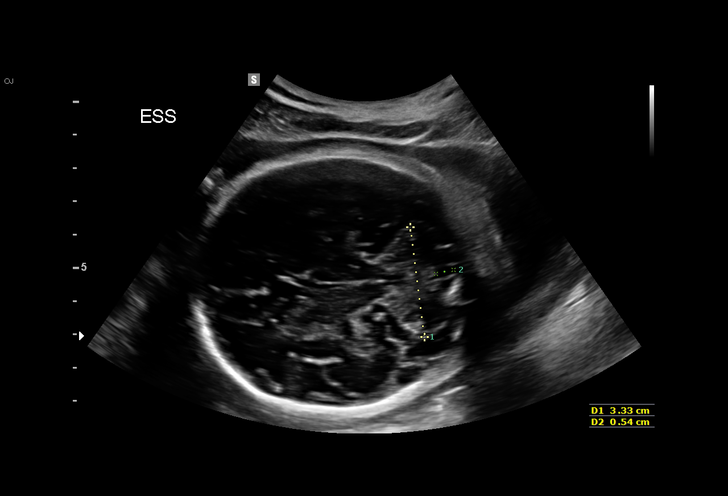
[im 56/56]
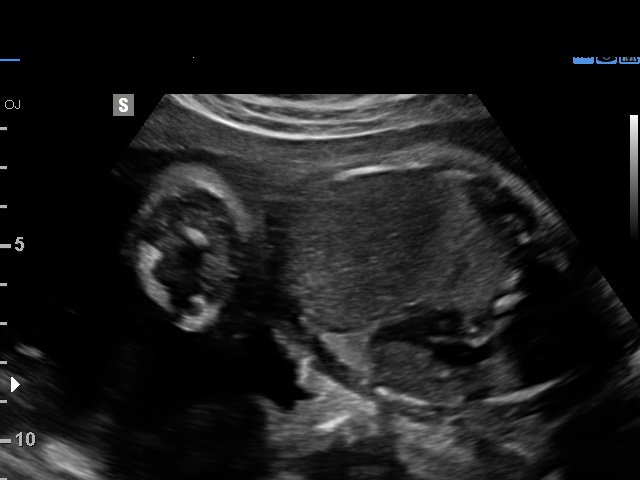

[14 of 28 positions shown; findings below may reference images not displayed]

am)

[facility]
[REDACTED]-
Faculty Physician

Indications

29 weeks gestation of pregnancy
Basic anatomic survey                          Z36
Fetal abnormality - other known or
suspected (i.e. choriod plexus cyst, EIF,
renal pyelectasis)
OB History

Gravidity:    2         Term:   1        Prem:   0        SAB:   0
TOP:          0       Ectopic:  0        Living: 1
Fetal Evaluation

Num Of Fetuses:     1
Fetal Heart         153
Rate(bpm):
Cardiac Activity:   Observed
Presentation:       Cephalic
Placenta:           Posterior, above cervical os
P. Cord Insertion:  Not well visualized

Amniotic Fluid
AFI FV:      Subjectively within normal limits
AFI Sum:     13.46   cm       41  %Tile     Larg Pckt:    6.01  cm
RUQ:   6.01    cm   RLQ:    2.44   cm    LUQ:   2.34    cm   LLQ:    2.67   cm
Biometry
BPD:      79.2  mm     G. Age:  31w 5d                  CI:         83.7   %    70 - 86
FL/HC:      20.7   %    19.2 -
HC:      272.9  mm     G. Age:  29w 6d         23  %    HC/AC:      1.07        0.99 -
AC:      255.1  mm     G. Age:  29w 5d         48  %    FL/BPD:     71.5   %    71 - 87
FL:       56.6  mm     G. Age:  29w 5d         40  %    FL/AC:      22.2   %    20 - 24
HUM:      47.6  mm     G. Age:  28w 0d         17  %
CER:      35.1  mm     G. Age:  30w 1d         61  %

Est. FW:    0719  gm      3 lb 4 oz     58  %
Gestational Age

LMP:           29w 4d        Date:  05/04/15                 EDD:   02/08/16
U/S Today:     30w 2d                                        EDD:   02/03/16
Best:          29w 4d     Det. By:  LMP  (05/04/15)          EDD:   02/08/16
Anatomy

Cranium:          Appears normal         Aortic Arch:      Not well visualized
Fetal Cavum:      Appears normal         Ductal Arch:      Not well visualized
Ventricles:       Appears normal         Diaphragm:        Appears normal
Choroid Plexus:   Not well visualized    Stomach:          Appears normal, left
sided
Cerebellum:       Appears normal         Abdomen:          Appears normal
Posterior Fossa:  Appears normal         Abdominal Wall:   Appears nml (cord
insert, abd wall)
Nuchal Fold:      Not applicable (>20    Cord Vessels:     Appears normal (3
wks GA)                                  vessel cord)
Face:             Appears normal         Kidneys:          Appear normal
(orbits and profile)
Lips:             Appears normal         Bladder:          Appears normal
Fetal Thoracic:   Appears normal         Spine:            Appears normal
Heart:            Not well visualized    Upper             Visualized
Extremities:
RVOT:             Not well visualized    Lower             Appears normal
Extremities:
LVOT:             Appears normal

Other:  Male gender. Technically difficult due to advanced GA and fetal
position.
Cervix Uterus Adnexa

Cervix
Not visualized (advanced GA >31wks)
Impression

SIUP at 29+4 weeks
Normal detailed fetal anatomy; limited views of heart
Markers of aneuploidy: none
Normal amniotic fluid volume
Measurements consistent with LMP dating; EFW at the 58th
%tile
Recommendations

Follow-up as clinically indicated
Attending Physician, FESTI

## 2017-07-21 ENCOUNTER — Ambulatory Visit (INDEPENDENT_AMBULATORY_CARE_PROVIDER_SITE_OTHER): Payer: BLUE CROSS/BLUE SHIELD | Admitting: Medical

## 2017-07-21 ENCOUNTER — Encounter: Payer: Self-pay | Admitting: Medical

## 2017-07-21 VITALS — BP 111/65 | HR 75 | Temp 98.3°F | Resp 16 | Wt 193.8 lb

## 2017-07-21 DIAGNOSIS — N3000 Acute cystitis without hematuria: Secondary | ICD-10-CM

## 2017-07-21 DIAGNOSIS — R300 Dysuria: Secondary | ICD-10-CM

## 2017-07-21 LAB — POC URINALSYSI DIPSTICK (AUTOMATED)
BILIRUBIN UA: NEGATIVE
GLUCOSE UA: NEGATIVE
Spec Grav, UA: >=1.03 — AB (ref 1.010–1.025)
Urobilinogen, UA: NEGATIVE E.U./dL — AB
pH, UA: 6 (ref 5.0–8.0)

## 2017-07-21 MED ORDER — AMOXICILLIN-POT CLAVULANATE 875-125 MG PO TABS
1.0000 | ORAL_TABLET | Freq: Two times a day (BID) | ORAL | 0 refills | Status: DC
Start: 2017-07-21 — End: 2017-10-04

## 2017-07-21 NOTE — Patient Instructions (Signed)
Your appear to have a urinary tract infection. I am prescribing augmentin antibiotic for the probable infection. Hydrate well. I am sending out a urine culture. During the interim if your signs and symptoms worsen rather than improving please notify us. We will notify your when the culture results are back.  Follow up in 7 days or as needed.

## 2017-07-21 NOTE — Progress Notes (Signed)
Subjective:    Patient ID: Gail Perez, female    DOB: 06/08/1991, 26 y.o.   MRN: [id number]  HPI Pt in today reporting urinary symptoms x 2 days.  Dysuria- mild pain and radiates toward bladder. Frequent urination-yes Hesitancy-no Suprapubic pressure-yes Fever-no chills-no Nausea-no Vomiting-no CVA pain-no History of UTI-4 uti since age 26 yo Gross hematuria-no   LMP- July 11, 2017. Came when expected.    Review of Systems  Constitutional: Negative for chills, fatigue and fever.  Respiratory: Negative for cough, chest tightness, shortness of breath and wheezing.   Cardiovascular: Negative for chest pain and palpitations.  Gastrointestinal: Negative for abdominal pain, constipation, nausea and vomiting.  Genitourinary: Positive for frequency and urgency. Negative for decreased urine volume, difficulty urinating, dysuria and enuresis.       Suprapubic pressure  Musculoskeletal: Negative for back pain and neck pain.       No CVA tenderness  Skin: Negative for rash.  Neurological: Negative for dizziness, syncope, speech difficulty, weakness and headaches.  Hematological: Negative for adenopathy. Does not bruise/bleed easily.  Psychiatric/Behavioral: Negative for behavioral problems, confusion, hallucinations, self-injury and suicidal ideas. The patient is not nervous/anxious.    Past Medical History:  Diagnosis Date  . Allergy   . Chlamydia   . Hx of varicella   . Pregnancy induced hypertension 2014  . Seasonal allergies   . Urinary tract infection   . Vaginal Pap smear, abnormal      Social History   Socioeconomic History  . Marital status: Single    Spouse name: Not on file  . Number of children: Not on file  . Years of education: Not on file  . Highest education level: Not on file  Social Needs  . Financial resource strain: Not on file  . Food insecurity - worry: Not on file  . Food insecurity - inability: Not on file  . Transportation needs -  medical: Not on file  . Transportation needs - non-medical: Not on file  Occupational History  . Not on file  Tobacco Use  . Smoking status: Never Smoker  . Smokeless tobacco: Never Used  Substance and Sexual Activity  . Alcohol use: No    Comment: occasional  . Drug use: No  . Sexual activity: Yes    Birth control/protection: Condom  Other Topics Concern  . Not on file  Social History Narrative  . Not on file    Past Surgical History:  Procedure Laterality Date  . LEEP      Family History  Problem Relation Age of Onset  . Hypertension Father   . Hyperlipidemia Father   . Heart disease Father        enlatged heart  . Sarcoidosis Father   . Anemia Mother   . Cancer Maternal Grandfather        lung  . Hypertension Sister   . Other Neg Hx     No Known Allergies  Current Outpatient Medications on File Prior to Visit  Medication Sig Dispense Refill  . omeprazole (PRILOSEC) 20 MG capsule Take 1 capsule (20 mg total) by mouth daily. 30 capsule 1  . ondansetron (ZOFRAN ODT) 8 MG disintegrating tablet Take 1 tablet (8 mg total) by mouth every 8 (eight) hours as needed for nausea or vomiting. 20 tablet 0   No current facility-administered medications on file prior to visit.     BP 111/65   Pulse 75   Temp 98.3 F (36.8 C) (Oral)   Resp 16  Wt 193 lb 12.8 oz (87.9 kg)   SpO2 100%   BMI 28.62 kg/m       Objective:   Physical Exam  General Appearance- Not in acute distress.  HEENT Eyes- Scleraeral/Conjuntiva-bilat- Not Yellow. Mouth & Throat- Normal.  Chest and Lung Exam Auscultation: Breath sounds:-Normal. Adventitious sounds:- No Adventitious sounds.  Cardiovascular Auscultation:Rythm - Regular. Heart Sounds -Normal heart sounds.  Abdomen Inspection:-Inspection Normal.  Palpation/Perucssion: Palpation and Percussion of the abdomen reveal- Non Tender/no suprapubic tenderness presently., No Rebound tenderness, No rigidity(Guarding) and No Palpable  abdominal masses.  Liver:-Normal.  Spleen:- Normal.   Back- no cva tenderness         Assessment & Plan:  Your appear to have a urinary tract infection. I am prescribing augmentin antibiotic for the probable infection. Hydrate well. I am sending out a urine culture. During the interim if your signs and symptoms worsen rather than improving please notify us. We will notify your when the culture results are back.  Follow up in 7 days or as needed.

## 2017-07-25 LAB — URINE CULTURE
MICRO NUMBER: 81348089
SPECIMEN QUALITY: ADEQUATE

## 2017-08-22 NOTE — L&D Delivery Note (Signed)
Patient: Carney Bernyisha Hunkins MRN: [id number]  GBS status: Negative  Patient is a 27 y.o. now G3P3 s/p NSVD at 8058w1d, who was admitted for IOL due to h/o PIH. AROM 0h 671m prior to delivery with clear fluid.    Delivery Note At 1:14 AM a viable female was delivered via Vaginal, Spontaneous (Presentation: LOA).  APGAR: 9, 9; weight pending.   Placenta status: spontaneous, intact.  Cord:  3 vessel with the following complications: nuchal x1.    Anesthesia:  Epidural  Episiotomy: None Lacerations: None Suture Repair: N/A Est. Blood Loss (mL): 31  Head delivered LOA. Nuchal cord present x1, reduced after delivery of the infant. Shoulder and body delivered in usual fashion. Infant with spontaneous cry, placed on mother's abdomen, dried and bulb suctioned. Cord clamped x 2 after 1-minute delay, and cut by family member. Cord blood drawn. Placenta delivered spontaneously with gentle cord traction. Fundus firm with massage and Pitocin. Perineum and vagina inspected and found to have no lacerations.  Mom to postpartum.  Baby to Couplet care / Skin to Skin.  De HollingsheadCatherine L Wallace 05/15/2018, 1:42 AM

## 2017-09-01 ENCOUNTER — Ambulatory Visit: Payer: Self-pay | Admitting: Family Medicine

## 2017-09-01 ENCOUNTER — Encounter: Payer: Self-pay | Admitting: Family Medicine

## 2017-09-01 ENCOUNTER — Ambulatory Visit: Payer: Self-pay | Admitting: Medical

## 2017-09-01 DIAGNOSIS — Z0289 Encounter for other administrative examinations: Secondary | ICD-10-CM

## 2017-10-04 ENCOUNTER — Inpatient Hospital Stay (HOSPITAL_COMMUNITY)
Admission: AD | Admit: 2017-10-04 | Discharge: 2017-10-04 | Disposition: A | Discharge status: Home / Self Care | Payer: BLUE CROSS/BLUE SHIELD | Source: Ambulatory Visit | Attending: Obstetrics and Gynecology | Admitting: Obstetrics and Gynecology

## 2017-10-04 ENCOUNTER — Inpatient Hospital Stay (HOSPITAL_COMMUNITY): Payer: BLUE CROSS/BLUE SHIELD

## 2017-10-04 ENCOUNTER — Encounter (HOSPITAL_COMMUNITY): Payer: Self-pay

## 2017-10-04 DIAGNOSIS — O26891 Other specified pregnancy related conditions, first trimester: Secondary | ICD-10-CM

## 2017-10-04 DIAGNOSIS — O26899 Other specified pregnancy related conditions, unspecified trimester: Secondary | ICD-10-CM

## 2017-10-04 DIAGNOSIS — Z3A08 8 weeks gestation of pregnancy: Secondary | ICD-10-CM

## 2017-10-04 DIAGNOSIS — O209 Hemorrhage in early pregnancy, unspecified: Secondary | ICD-10-CM | POA: Insufficient documentation

## 2017-10-04 DIAGNOSIS — R109 Unspecified abdominal pain: Secondary | ICD-10-CM | POA: Insufficient documentation

## 2017-10-04 DIAGNOSIS — Z3491 Encounter for supervision of normal pregnancy, unspecified, first trimester: Secondary | ICD-10-CM

## 2017-10-04 LAB — URINALYSIS, ROUTINE W REFLEX MICROSCOPIC
Bilirubin Urine: NEGATIVE
GLUCOSE, UA: NEGATIVE mg/dL
Hgb urine dipstick: NEGATIVE
KETONES UR: NEGATIVE mg/dL
Nitrite: NEGATIVE
PROTEIN: NEGATIVE mg/dL
Specific Gravity, Urine: 1.026 (ref 1.005–1.030)
pH: 7 (ref 5.0–8.0)

## 2017-10-04 LAB — WET PREP, GENITAL
Clue Cells Wet Prep HPF POC: NONE SEEN
Sperm: NONE SEEN
TRICH WET PREP: NONE SEEN
YEAST WET PREP: NONE SEEN

## 2017-10-04 LAB — POCT PREGNANCY, URINE: PREG TEST UR: POSITIVE — AB

## 2017-10-04 LAB — CBC
HEMATOCRIT: 37 % (ref 36.0–46.0)
HEMOGLOBIN: 12.6 g/dL (ref 12.0–15.0)
MCH: 28.1 pg (ref 26.0–34.0)
MCHC: 34.1 g/dL (ref 30.0–36.0)
MCV: 82.4 fL (ref 78.0–100.0)
Platelets: 359 10*3/uL (ref 150–400)
RBC: 4.49 MIL/uL (ref 3.87–5.11)
RDW: 13.7 % (ref 11.5–15.5)
WBC: 10.5 10*3/uL (ref 4.0–10.5)

## 2017-10-04 LAB — HCG, QUANTITATIVE, PREGNANCY: HCG, BETA CHAIN, QUANT, S: 129653 m[IU]/mL — AB (ref <5)

## 2017-10-04 NOTE — MAU Provider Note (Signed)
History     CSN: [id number]  Arrival date and time: 10/04/17 4098   First Provider Initiated Contact with Patient 10/04/17 1737      Chief Complaint  Patient presents with  . Vaginal Bleeding   HPI Gail Perez is a 27 y.o. G3P2002 at [redacted]w[redacted]d who presents with vaginal bleeding and pain. She states she had intercourse on Monday and had heavy bleeding like a period after that. No bleeding now. She also reports lower abdominal pain and lower back pain that she rates a 5/10 since Monday. She has not tried anything for the pain. She has not started care with this pregnancy yet.   OB History    Gravida Para Term Preterm AB Living   3 2 2  0 0 2   SAB TAB Ectopic Multiple Live Births   0 0 0 0 2      Past Medical History:  Diagnosis Date  . Allergy   . Chlamydia   . Hx of varicella   . Pregnancy induced hypertension 2014  . Seasonal allergies   . Urinary tract infection   . Vaginal Pap smear, abnormal     Past Surgical History:  Procedure Laterality Date  . LEEP      Family History  Problem Relation Age of Onset  . Hypertension Father   . Hyperlipidemia Father   . Heart disease Father        enlatged heart  . Sarcoidosis Father   . Anemia Mother   . Cancer Maternal Grandfather        lung  . Hypertension Sister   . Other Neg Hx     Social History   Tobacco Use  . Smoking status: Never Smoker  . Smokeless tobacco: Never Used  Substance Use Topics  . Alcohol use: No    Comment: occasional  . Drug use: No    Allergies: No Known Allergies  Medications Prior to Admission  Medication Sig Dispense Refill Last Dose  . amoxicillin-clavulanate (AUGMENTIN) 875-125 MG tablet Take 1 tablet by mouth 2 (two) times daily. 20 tablet 0   . omeprazole (PRILOSEC) 20 MG capsule Take 1 capsule (20 mg total) by mouth daily. 30 capsule 1 Taking  . ondansetron (ZOFRAN ODT) 8 MG disintegrating tablet Take 1 tablet (8 mg total) by mouth every 8 (eight) hours as needed for nausea or  vomiting. 20 tablet 0 Taking    Review of Systems  Constitutional: Negative.  Negative for fatigue and fever.  HENT: Negative.   Respiratory: Negative.  Negative for shortness of breath.   Cardiovascular: Negative.  Negative for chest pain.  Gastrointestinal: Positive for abdominal pain. Negative for constipation, diarrhea, nausea and vomiting.  Genitourinary: Positive for vaginal bleeding. Negative for dysuria and vaginal discharge.  Neurological: Negative.  Negative for dizziness and headaches.   Physical Exam   Blood pressure 137/76, pulse 78, resp. rate 18, height 5\' 9"  (1.753 m), weight 204 lb (92.5 kg), last menstrual period 08/07/2017, SpO2 98 %, not currently breastfeeding.  Physical Exam  Nursing note and vitals reviewed. Constitutional: She is oriented to person, place, and time. She appears well-developed and well-nourished. No distress.  HENT:  Head: Normocephalic.  Eyes: Pupils are equal, round, and reactive to light.  Cardiovascular: Normal rate, regular rhythm and normal heart sounds.  Respiratory: Effort normal and breath sounds normal. No respiratory distress.  GI: Soft. Bowel sounds are normal. She exhibits no distension. There is no tenderness.  Genitourinary: Vaginal discharge found.  Neurological: She  is alert and oriented to person, place, and time.  Skin: Skin is warm and dry.  Psychiatric: She has a normal mood and affect. Her behavior is normal. Judgment and thought content normal.    Pelvic exam: Cervix pink, visually closed, without lesion, scant white creamy discharge, vaginal walls and external genitalia normal Bimanual exam: Cervix 0/long/high, firm, anterior, neg CMT, uterus nontender, nonenlarged, adnexa without tenderness, enlargement, or mass  MAU Course  Procedures Results for orders placed or performed during the hospital encounter of 10/04/17 (from the past 24 hour(s))  Urinalysis, Routine w reflex microscopic     Status: Abnormal   Collection  Time: 10/04/17  4:58 PM  Result Value Ref Range   Color, Urine YELLOW YELLOW   APPearance HAZY (A) CLEAR   Specific Gravity, Urine 1.026 1.005 - 1.030   pH 7.0 5.0 - 8.0   Glucose, UA NEGATIVE NEGATIVE mg/dL   Hgb urine dipstick NEGATIVE NEGATIVE   Bilirubin Urine NEGATIVE NEGATIVE   Ketones, ur NEGATIVE NEGATIVE mg/dL   Protein, ur NEGATIVE NEGATIVE mg/dL   Nitrite NEGATIVE NEGATIVE   Leukocytes, UA SMALL (A) NEGATIVE   RBC / HPF 0-5 0 - 5 RBC/hpf   WBC, UA 0-5 0 - 5 WBC/hpf   Bacteria, UA RARE (A) NONE SEEN   Squamous Epithelial / LPF 0-5 (A) NONE SEEN   Mucus PRESENT   Pregnancy, urine POC     Status: Abnormal   Collection Time: 10/04/17  5:14 PM  Result Value Ref Range   Preg Test, Ur POSITIVE (A) NEGATIVE  Wet prep, genital     Status: Abnormal   Collection Time: 10/04/17  5:40 PM  Result Value Ref Range   Yeast Wet Prep HPF POC NONE SEEN NONE SEEN   Trich, Wet Prep NONE SEEN NONE SEEN   Clue Cells Wet Prep HPF POC NONE SEEN NONE SEEN   WBC, Wet Prep HPF POC FEW (A) NONE SEEN   Sperm NONE SEEN   CBC     Status: None   Collection Time: 10/04/17  5:57 PM  Result Value Ref Range   WBC 10.5 4.0 - 10.5 K/uL   RBC 4.49 3.87 - 5.11 MIL/uL   Hemoglobin 12.6 12.0 - 15.0 g/dL   HCT 16.1 09.6 - 04.5 %   MCV 82.4 78.0 - 100.0 fL   MCH 28.1 26.0 - 34.0 pg   MCHC 34.1 30.0 - 36.0 g/dL   RDW 40.9 81.1 - 91.4 %   Platelets 359 150 - 400 K/uL  hCG, quantitative, pregnancy     Status: Abnormal   Collection Time: 10/04/17  5:57 PM  Result Value Ref Range   hCG, Beta Chain, Quant, S 129,653 (H) <5 mIU/mL   US Ob Less Than 14 Weeks With Ob Transvaginal  Result Date: 10/04/2017 CLINICAL DATA:  Vaginal bleeding, estimated gestational age by last menstrual period equals 8 weeks 2 days EXAM: OBSTETRIC <14 WK [place] AND TRANSVAGINAL OB US TECHNIQUE: Both transabdominal and transvaginal ultrasound examinations were performed for complete evaluation of the gestation as well as the maternal  uterus, adnexal regions, and pelvic cul-de-sac. Transvaginal technique was performed to assess early pregnancy. COMPARISON:  None FINDINGS: Intrauterine gestational sac: Single Yolk sac:  Present Embryo:  Present Cardiac Activity: Present Heart Rate: 182 bpm CRL:  19.1 mm   8 w   3 d                  [place] EDC: 04/23/2018 Subchorionic hemorrhage:  None  visualized. Maternal uterus/adnexae: Corpus luteal cyst of the RIGHT ovary. No free fluid IMPRESSION: 1. Single intrauterine gestation with embryo and normal cardiac activity. 2. Estimated gestational age by crown rump length equals 8 weeks 3 days. Electronically Signed   By: Genevive BiStewart  Edmunds M.D.   On: 10/04/2017 18:33   MDM UA, UPT CBC, HCG, ABO/Rh Wet prep and gc/chlamydia US OB Comp Less 14 weeks with Transvaginal- Normal IUP with FHR 182 bpm, right corpus luteum cyst, no subchorionic hemorrhage  Assessment and Plan   1. Normal intrauterine pregnancy on prenatal ultrasound in first trimester   2. Abdominal pain affecting pregnancy   3. [redacted] weeks gestation of pregnancy    -Discharge home in stable condition -First trimester precautions discussed -Patient [facility] to start prenatal care -Patient may return to MAU as needed or if her condition were to change or worsen  Rolm BookbinderCaroline M Neill CNM 10/04/2017, 5:43 PM

## 2017-10-04 NOTE — MAU Note (Signed)
Pt states vaginal bleeding post intercourse on Monday 10/02/17.  Pt states she experienced bright red spotting at this time.  No vaginal bleeding since Monday 2/11

## 2017-10-04 NOTE — Discharge Instructions (Signed)
Safe Medications in Pregnancy  ° °Acne: °Benzoyl Peroxide °Salicylic Acid ° °Backache/Headache: °Tylenol: 2 regular strength every 4 hours OR °             2 Extra strength every 6 hours ° °Colds/Coughs/Allergies: °Benadryl (alcohol free) 25 mg every 6 hours as needed °Breath right strips °Claritin °Cepacol throat lozenges °Chloraseptic throat spray °Cold-Eeze- up to three times per day °Cough drops, alcohol free °Flonase (by prescription only) °Guaifenesin °Mucinex °Robitussin DM (plain only, alcohol free) °Saline nasal spray/drops °Sudafed (pseudoephedrine) & Actifed ** use only after [redacted] weeks gestation and if you do not have high blood pressure °Tylenol °Vicks Vaporub °Zinc lozenges °Zyrtec  ° °Constipation: °Colace °Ducolax suppositories °Fleet enema °Glycerin suppositories °Metamucil °Milk of magnesia °Miralax °Senokot °Smooth move tea ° °Diarrhea: °Kaopectate °Imodium A-D ° °*NO pepto Bismol ° °Hemorrhoids: °Anusol °Anusol HC °Preparation H °Tucks ° °Indigestion: °Tums °Maalox °Mylanta °Zantac  °Pepcid ° °Insomnia: °Benadryl (alcohol free) 25mg every 6 hours as needed °Tylenol PM °Unisom, no Gelcaps ° °Leg Cramps: °Tums °MagGel ° °Nausea/Vomiting:  °Bonine °Dramamine °Emetrol °Ginger extract °Sea bands °Meclizine  °Nausea medication to take during pregnancy:  °Unisom (doxylamine succinate 25 mg tablets) Take one tablet daily at bedtime. If symptoms are not adequately controlled, the dose can be increased to a maximum recommended dose of two tablets daily (1/2 tablet in the morning, 1/2 tablet mid-afternoon and one at bedtime). °Vitamin B6 100mg tablets. Take one tablet twice a day (up to 200 mg per day). ° °Skin Rashes: °Aveeno products °Benadryl cream or 25mg every 6 hours as needed °Calamine Lotion °1% cortisone cream ° °Yeast infection: °Gyne-lotrimin 7 °Monistat 7 ° ° °**If taking multiple medications, please check labels to avoid duplicating the same active ingredients °**take medication as directed on  the label °** Do not exceed 4000 mg of tylenol in 24 hours °**Do not take medications that contain aspirin or ibuprofen ° ° ° ° ° °Abdominal Pain During Pregnancy °Abdominal pain is common in pregnancy. Most of the time, it does not cause harm. There are many causes of abdominal pain. Some causes are more serious than others and sometimes the cause is not known. Abdominal pain can be a sign that something is very wrong with the pregnancy or the pain may have nothing to do with the pregnancy. Always tell your health care provider if you have any abdominal pain. °Follow these instructions at home: °· Do not have sex or put anything in your vagina until your symptoms go away completely. °· Watch your abdominal pain for any changes. °· Get plenty of rest until your pain improves. °· Drink enough fluid to keep your urine clear or pale yellow. °· Take over-the-counter or prescription medicines only as told by your health care provider. °· Keep all follow-up visits as told by your health care provider. This is important. °Contact a health care provider if: °· You have a fever. °· Your pain gets worse or you have cramping. °· Your pain continues after resting. °Get help right away if: °· You are bleeding, leaking fluid, or passing tissue from the vagina. °· You have vomiting or diarrhea that does not go away. °· You have painful or bloody urination. °· You notice a decrease in your baby's movements. °· You feel very weak or faint. °· You have shortness of breath. °· You develop a severe headache with abdominal pain. °· You have abnormal vaginal discharge with abdominal pain. °This information is not intended to replace advice given   to you by your health care provider. Make sure you discuss any questions you have with your health care provider. °Document Released: 08/08/2005 Document Revised: 05/19/2016 Document Reviewed: 03/07/2013 °[employer] Interactive Patient Education © 2018 [employer] ° °

## 2017-10-05 LAB — GC/CHLAMYDIA PROBE AMP (~~LOC~~) NOT AT ARMC
Chlamydia: NEGATIVE
NEISSERIA GONORRHEA: NEGATIVE

## 2017-10-18 ENCOUNTER — Ambulatory Visit (INDEPENDENT_AMBULATORY_CARE_PROVIDER_SITE_OTHER): Payer: BLUE CROSS/BLUE SHIELD | Admitting: Obstetrics and Gynecology

## 2017-10-18 ENCOUNTER — Encounter: Payer: Self-pay | Admitting: Obstetrics and Gynecology

## 2017-10-18 VITALS — BP 115/57 | HR 76 | Wt 204.0 lb

## 2017-10-18 DIAGNOSIS — Z8759 Personal history of other complications of pregnancy, childbirth and the puerperium: Secondary | ICD-10-CM

## 2017-10-18 DIAGNOSIS — Z3480 Encounter for supervision of other normal pregnancy, unspecified trimester: Secondary | ICD-10-CM | POA: Insufficient documentation

## 2017-10-18 DIAGNOSIS — Z1151 Encounter for screening for human papillomavirus (HPV): Secondary | ICD-10-CM | POA: Diagnosis not present

## 2017-10-18 DIAGNOSIS — Z9889 Other specified postprocedural states: Secondary | ICD-10-CM

## 2017-10-18 DIAGNOSIS — Z124 Encounter for screening for malignant neoplasm of cervix: Secondary | ICD-10-CM

## 2017-10-18 DIAGNOSIS — Z3481 Encounter for supervision of other normal pregnancy, first trimester: Secondary | ICD-10-CM

## 2017-10-18 MED ORDER — DOXYLAMINE SUCCINATE (SLEEP) 25 MG PO TABS: 25.0000 mg | ORAL_TABLET | Freq: Every evening | ORAL | 0 refills | Status: DC | PRN

## 2017-10-18 MED ORDER — ASPIRIN EC 81 MG PO TBEC: 81.0000 mg | DELAYED_RELEASE_TABLET | Freq: Every day | ORAL | 2 refills | Status: DC

## 2017-10-18 MED ORDER — PNV-DHA 27-0.6-0.4-300 MG PO CAPS: 1.0000 | ORAL_CAPSULE | Freq: Every day | ORAL | 6 refills | Status: DC

## 2017-10-18 MED ORDER — PYRIDOXINE HCL 25 MG PO TABS: 25.0000 mg | ORAL_TABLET | Freq: Every day | ORAL | 3 refills | Status: DC

## 2017-10-18 NOTE — Progress Notes (Signed)
Pt complaints of N&V, constipation.

## 2017-10-18 NOTE — Patient Instructions (Signed)
 First Trimester of Pregnancy The first trimester of pregnancy is from week 1 until the end of week 13 (months 1 through 3). A week after a sperm fertilizes an egg, the egg will implant on the wall of the uterus. This embryo will begin to develop into a baby. Genes from you and your partner will form the baby. The female genes will determine whether the baby will be a boy or a girl. At 6-8 weeks, the eyes and face will be formed, and the heartbeat can be seen on ultrasound. At the end of 12 weeks, all the baby's organs will be formed. Now that you are pregnant, you will want to do everything you can to have a healthy baby. Two of the most important things are to get good prenatal care and to follow your health care provider's instructions. Prenatal care is all the medical care you receive before the baby's birth. This care will help prevent, find, and treat any problems during the pregnancy and childbirth. Body changes during your first trimester Your body goes through many changes during pregnancy. The changes vary from woman to woman.  You may gain or lose a couple of pounds at first.  You may feel sick to your stomach (nauseous) and you may throw up (vomit). If the vomiting is uncontrollable, call your health care provider.  You may tire easily.  You may develop headaches that can be relieved by medicines. All medicines should be approved by your health care provider.  You may urinate more often. Painful urination may mean you have a bladder infection.  You may develop heartburn as a result of your pregnancy.  You may develop constipation because certain hormones are causing the muscles that push stool through your intestines to slow down.  You may develop hemorrhoids or swollen veins (varicose veins).  Your breasts may begin to grow larger and become tender. Your nipples may stick out more, and the tissue that surrounds them (areola) may become darker.  Your gums may bleed and may be  sensitive to brushing and flossing.  Dark spots or blotches (chloasma, mask of pregnancy) may develop on your face. This will likely fade after the baby is born.  Your menstrual periods will stop.  You may have a loss of appetite.  You may develop cravings for certain kinds of food.  You may have changes in your emotions from day to day, such as being excited to be pregnant or being concerned that something may go wrong with the pregnancy and baby.  You may have more vivid and strange dreams.  You may have changes in your hair. These can include thickening of your hair, rapid growth, and changes in texture. Some women also have hair loss during or after pregnancy, or hair that feels dry or thin. Your hair will most likely return to normal after your baby is born.  What to expect at prenatal visits During a routine prenatal visit:  You will be weighed to make sure you and the baby are growing normally.  Your blood pressure will be taken.  Your abdomen will be measured to track your baby's growth.  The fetal heartbeat will be listened to between weeks 10 and 14 of your pregnancy.  Test results from any previous visits will be discussed.  Your health care provider may ask you:  How you are feeling.  If you are feeling the baby move.  If you have had any abnormal symptoms, such as leaking fluid, bleeding, severe   headaches, or abdominal cramping.  If you are using any tobacco products, including cigarettes, chewing tobacco, and electronic cigarettes.  If you have any questions.  Other tests that may be performed during your first trimester include:  Blood tests to find your blood type and to check for the presence of any previous infections. The tests will also be used to check for low iron levels (anemia) and protein on red blood cells (Rh antibodies). Depending on your risk factors, or if you previously had diabetes during pregnancy, you may have tests to check for high blood  sugar that affects pregnant women (gestational diabetes).  Urine tests to check for infections, diabetes, or protein in the urine.  An ultrasound to confirm the proper growth and development of the baby.  Fetal screens for spinal cord problems (spina bifida) and Down syndrome.  HIV (human immunodeficiency virus) testing. Routine prenatal testing includes screening for HIV, unless you choose not to have this test.  You may need other tests to make sure you and the baby are doing well.  Follow these instructions at home: Medicines  Follow your health care provider's instructions regarding medicine use. Specific medicines may be either safe or unsafe to take during pregnancy.  Take a prenatal vitamin that contains at least 600 micrograms (mcg) of folic acid.  If you develop constipation, try taking a stool softener if your health care provider approves. Eating and drinking  Eat a balanced diet that includes fresh fruits and vegetables, whole grains, good sources of protein such as meat, eggs, or tofu, and low-fat dairy. Your health care provider will help you determine the amount of weight gain that is right for you.  Avoid raw meat and uncooked cheese. These carry germs that can cause birth defects in the baby.  Eating four or five small meals rather than three large meals a day may help relieve nausea and vomiting. If you start to feel nauseous, eating a few soda crackers can be helpful. Drinking liquids between meals, instead of during meals, also seems to help ease nausea and vomiting.  Limit foods that are high in fat and processed sugars, such as fried and sweet foods.  To prevent constipation: ? Eat foods that are high in fiber, such as fresh fruits and vegetables, whole grains, and beans. ? Drink enough fluid to keep your urine clear or pale yellow. Activity  Exercise only as directed by your health care provider. Most women can continue their usual exercise routine during  pregnancy. Try to exercise for 30 minutes at least 5 days a week. Exercising will help you: ? Control your weight. ? Stay in shape. ? Be prepared for labor and delivery.  Experiencing pain or cramping in the lower abdomen or lower back is a good sign that you should stop exercising. Check with your health care provider before continuing with normal exercises.  Try to avoid standing for long periods of time. Move your legs often if you must stand in one place for a long time.  Avoid heavy lifting.  Wear low-heeled shoes and practice good posture.  You may continue to have sex unless your health care provider tells you not to. Relieving pain and discomfort  Wear a good support bra to relieve breast tenderness.  Take warm sitz baths to soothe any pain or discomfort caused by hemorrhoids. Use hemorrhoid cream if your health care provider approves.  Rest with your legs elevated if you have leg cramps or low back pain.  If you   develop varicose veins in your legs, wear support hose. Elevate your feet for 15 minutes, 3-4 times a day. Limit salt in your diet. Prenatal care  Schedule your prenatal visits by the twelfth week of pregnancy. They are usually scheduled monthly at first, then more often in the last 2 months before delivery.  Write down your questions. Take them to your prenatal visits.  Keep all your prenatal visits as told by your health care provider. This is important. Safety  Wear your seat belt at all times when driving.  Make a list of emergency phone numbers, including numbers for family, friends, the hospital, and police and fire departments. General instructions  Ask your health care provider for a referral to a local prenatal education class. Begin classes no later than the beginning of month 6 of your pregnancy.  Ask for help if you have counseling or nutritional needs during pregnancy. Your health care provider can offer advice or refer you to specialists for help  with various needs.  Do not use hot tubs, steam rooms, or saunas.  Do not douche or use tampons or scented sanitary pads.  Do not cross your legs for long periods of time.  Avoid cat litter boxes and soil used by cats. These carry germs that can cause birth defects in the baby and possibly loss of the fetus by miscarriage or stillbirth.  Avoid all smoking, herbs, alcohol, and medicines not prescribed by your health care provider. Chemicals in these products affect the formation and growth of the baby.  Do not use any products that contain nicotine or tobacco, such as cigarettes and e-cigarettes. If you need help quitting, ask your health care provider. You may receive counseling support and other resources to help you quit.  Schedule a dentist appointment. At home, brush your teeth with a soft toothbrush and be gentle when you floss. Contact a health care provider if:  You have dizziness.  You have mild pelvic cramps, pelvic pressure, or nagging pain in the abdominal area.  You have persistent nausea, vomiting, or diarrhea.  You have a bad smelling vaginal discharge.  You have pain when you urinate.  You notice increased swelling in your face, hands, legs, or ankles.  You are exposed to fifth disease or chickenpox.  You are exposed to German measles (rubella) and have never had it. Get help right away if:  You have a fever.  You are leaking fluid from your vagina.  You have spotting or bleeding from your vagina.  You have severe abdominal cramping or pain.  You have rapid weight gain or loss.  You vomit blood or material that looks like coffee grounds.  You develop a severe headache.  You have shortness of breath.  You have any kind of trauma, such as from a fall or a car accident. Summary  The first trimester of pregnancy is from week 1 until the end of week 13 (months 1 through 3).  Your body goes through many changes during pregnancy. The changes vary from  woman to woman.  You will have routine prenatal visits. During those visits, your health care provider will examine you, discuss any test results you may have, and talk with you about how you are feeling. This information is not intended to replace advice given to you by your health care provider. Make sure you discuss any questions you have with your health care provider. Document Released: 08/02/2001 Document Revised: 07/20/2016 Document Reviewed: 07/20/2016 [employer] Interactive Patient Education  2018   [employer]   Second Trimester of Pregnancy The second trimester is from week 14 through week 27 (months 4 through 6). The second trimester is often a time when you feel your best. Your body has adjusted to being pregnant, and you begin to feel better physically. Usually, morning sickness has lessened or quit completely, you may have more energy, and you may have an increase in appetite. The second trimester is also a time when the fetus is growing rapidly. At the end of the sixth month, the fetus is about 9 inches long and weighs about 1 pounds. You will likely begin to feel the baby move (quickening) between 16 and 20 weeks of pregnancy. Body changes during your second trimester Your body continues to go through many changes during your second trimester. The changes vary from woman to woman.  Your weight will continue to increase. You will notice your lower abdomen bulging out.  You may begin to get stretch marks on your hips, abdomen, and breasts.  You may develop headaches that can be relieved by medicines. The medicines should be approved by your health care provider.  You may urinate more often because the fetus is pressing on your bladder.  You may develop or continue to have heartburn as a result of your pregnancy.  You may develop constipation because certain hormones are causing the muscles that push waste through your intestines to slow down.  You may develop hemorrhoids or  swollen, bulging veins (varicose veins).  You may have back pain. This is caused by: ? Weight gain. ? Pregnancy hormones that are relaxing the joints in your pelvis. ? A shift in weight and the muscles that support your balance.  Your breasts will continue to grow and they will continue to become tender.  Your gums may bleed and may be sensitive to brushing and flossing.  Dark spots or blotches (chloasma, mask of pregnancy) may develop on your face. This will likely fade after the baby is born.  A dark line from your belly button to the pubic area (linea nigra) may appear. This will likely fade after the baby is born.  You may have changes in your hair. These can include thickening of your hair, rapid growth, and changes in texture. Some women also have hair loss during or after pregnancy, or hair that feels dry or thin. Your hair will most likely return to normal after your baby is born.  What to expect at prenatal visits During a routine prenatal visit:  You will be weighed to make sure you and the fetus are growing normally.  Your blood pressure will be taken.  Your abdomen will be measured to track your baby's growth.  The fetal heartbeat will be listened to.  Any test results from the previous visit will be discussed.  Your health care provider may ask you:  How you are feeling.  If you are feeling the baby move.  If you have had any abnormal symptoms, such as leaking fluid, bleeding, severe headaches, or abdominal cramping.  If you are using any tobacco products, including cigarettes, chewing tobacco, and electronic cigarettes.  If you have any questions.  Other tests that may be performed during your second trimester include:  Blood tests that check for: ? Low iron levels (anemia). ? High blood sugar that affects pregnant women (gestational diabetes) between 24 and 28 weeks. ? Rh antibodies. This is to check for a protein on red blood cells (Rh factor).  Urine  tests to   check for infections, diabetes, or protein in the urine.  An ultrasound to confirm the proper growth and development of the baby.  An amniocentesis to check for possible genetic problems.  Fetal screens for spina bifida and Down syndrome.  HIV (human immunodeficiency virus) testing. Routine prenatal testing includes screening for HIV, unless you choose not to have this test.  Follow these instructions at home: Medicines  Follow your health care provider's instructions regarding medicine use. Specific medicines may be either safe or unsafe to take during pregnancy.  Take a prenatal vitamin that contains at least 600 micrograms (mcg) of folic acid.  If you develop constipation, try taking a stool softener if your health care provider approves. Eating and drinking  Eat a balanced diet that includes fresh fruits and vegetables, whole grains, good sources of protein such as meat, eggs, or tofu, and low-fat dairy. Your health care provider will help you determine the amount of weight gain that is right for you.  Avoid raw meat and uncooked cheese. These carry germs that can cause birth defects in the baby.  If you have low calcium intake from food, talk to your health care provider about whether you should take a daily calcium supplement.  Limit foods that are high in fat and processed sugars, such as fried and sweet foods.  To prevent constipation: ? Drink enough fluid to keep your urine clear or pale yellow. ? Eat foods that are high in fiber, such as fresh fruits and vegetables, whole grains, and beans. Activity  Exercise only as directed by your health care provider. Most women can continue their usual exercise routine during pregnancy. Try to exercise for 30 minutes at least 5 days a week. Stop exercising if you experience uterine contractions.  Avoid heavy lifting, wear low heel shoes, and practice good posture.  A sexual relationship may be continued unless your health  care provider directs you otherwise. Relieving pain and discomfort  Wear a good support bra to prevent discomfort from breast tenderness.  Take warm sitz baths to soothe any pain or discomfort caused by hemorrhoids. Use hemorrhoid cream if your health care provider approves.  Rest with your legs elevated if you have leg cramps or low back pain.  If you develop varicose veins, wear support hose. Elevate your feet for 15 minutes, 3-4 times a day. Limit salt in your diet. Prenatal Care  Write down your questions. Take them to your prenatal visits.  Keep all your prenatal visits as told by your health care provider. This is important. Safety  Wear your seat belt at all times when driving.  Make a list of emergency phone numbers, including numbers for family, friends, the hospital, and police and fire departments. General instructions  Ask your health care provider for a referral to a local prenatal education class. Begin classes no later than the beginning of month 6 of your pregnancy.  Ask for help if you have counseling or nutritional needs during pregnancy. Your health care provider can offer advice or refer you to specialists for help with various needs.  Do not use hot tubs, steam rooms, or saunas.  Do not douche or use tampons or scented sanitary pads.  Do not cross your legs for long periods of time.  Avoid cat litter boxes and soil used by cats. These carry germs that can cause birth defects in the baby and possibly loss of the fetus by miscarriage or stillbirth.  Avoid all smoking, herbs, alcohol, and unprescribed drugs. Chemicals   in these products can affect the formation and growth of the baby.  Do not use any products that contain nicotine or tobacco, such as cigarettes and e-cigarettes. If you need help quitting, ask your health care provider.  Visit your dentist if you have not gone yet during your pregnancy. Use a soft toothbrush to brush your teeth and be gentle when  you floss. Contact a health care provider if:  You have dizziness.  You have mild pelvic cramps, pelvic pressure, or nagging pain in the abdominal area.  You have persistent nausea, vomiting, or diarrhea.  You have a bad smelling vaginal discharge.  You have pain when you urinate. Get help right away if:  You have a fever.  You are leaking fluid from your vagina.  You have spotting or bleeding from your vagina.  You have severe abdominal cramping or pain.  You have rapid weight gain or weight loss.  You have shortness of breath with chest pain.  You notice sudden or extreme swelling of your face, hands, ankles, feet, or legs.  You have not felt your baby move in over an hour.  You have severe headaches that do not go away when you take medicine.  You have vision changes. Summary  The second trimester is from week 14 through week 27 (months 4 through 6). It is also a time when the fetus is growing rapidly.  Your body goes through many changes during pregnancy. The changes vary from woman to woman.  Avoid all smoking, herbs, alcohol, and unprescribed drugs. These chemicals affect the formation and growth your baby.  Do not use any tobacco products, such as cigarettes, chewing tobacco, and e-cigarettes. If you need help quitting, ask your health care provider.  Contact your health care provider if you have any questions. Keep all prenatal visits as told by your health care provider. This is important. This information is not intended to replace advice given to you by your health care provider. Make sure you discuss any questions you have with your health care provider. Document Released: 08/02/2001 Document Revised: 09/13/2016 Document Reviewed: 09/13/2016 [employer] Interactive Patient Education  2018 [employer]   Contraception Choices Contraception, also called birth control, refers to methods or devices that prevent pregnancy. Hormonal methods Contraceptive  implant A contraceptive implant is a thin, plastic tube that contains a hormone. It is inserted into the upper part of the arm. It can remain in place for up to 3 years. Progestin-only injections Progestin-only injections are injections of progestin, a synthetic form of the hormone progesterone. They are given every 3 months by a health care provider. Birth control pills Birth control pills are pills that contain hormones that prevent pregnancy. They must be taken once a day, preferably at the same time each day. Birth control patch The birth control patch contains hormones that prevent pregnancy. It is placed on the skin and must be changed once a week for three weeks and removed on the fourth week. A prescription is needed to use this method of contraception. Vaginal ring A vaginal ring contains hormones that prevent pregnancy. It is placed in the vagina for three weeks and removed on the fourth week. After that, the process is repeated with a new ring. A prescription is needed to use this method of contraception. Emergency contraceptive Emergency contraceptives prevent pregnancy after unprotected sex. They come in pill form and can be taken up to 5 days after sex. They work best the sooner they are taken after having   sex. Most emergency contraceptives are available without a prescription. This method should not be used as your only form of birth control. Barrier methods Female condom A female condom is a thin sheath that is worn over the penis during sex. Condoms keep sperm from going inside a woman's body. They can be used with a spermicide to increase their effectiveness. They should be disposed after a single use. Female condom A female condom is a soft, loose-fitting sheath that is put into the vagina before sex. The condom keeps sperm from going inside a woman's body. They should be disposed after a single use. Diaphragm A diaphragm is a soft, dome-shaped barrier. It is inserted into the vagina  before sex, along with a spermicide. The diaphragm blocks sperm from entering the uterus, and the spermicide kills sperm. A diaphragm should be left in the vagina for 6-8 hours after sex and removed within 24 hours. A diaphragm is prescribed and fitted by a health care provider. A diaphragm should be replaced every 1-2 years, after giving birth, after gaining more than 15 lb (6.8 kg), and after pelvic surgery. Cervical cap A cervical cap is a round, soft latex or plastic cup that fits over the cervix. It is inserted into the vagina before sex, along with spermicide. It blocks sperm from entering the uterus. The cap should be left in place for 6-8 hours after sex and removed within 48 hours. A cervical cap must be prescribed and fitted by a health care provider. It should be replaced every 2 years. Sponge A sponge is a soft, circular piece of polyurethane foam with spermicide on it. The sponge helps block sperm from entering the uterus, and the spermicide kills sperm. To use it, you make it wet and then insert it into the vagina. It should be inserted before sex, left in for at least 6 hours after sex, and removed and thrown away within 30 hours. Spermicides Spermicides are chemicals that kill or block sperm from entering the cervix and uterus. They can come as a cream, jelly, suppository, foam, or tablet. A spermicide should be inserted into the vagina with an applicator at least 10-15 minutes before sex to allow time for it to work. The process must be repeated every time you have sex. Spermicides do not require a prescription. Intrauterine contraception Intrauterine device (IUD) An IUD is a T-shaped device that is put in a woman's uterus. There are two types:  Hormone IUD.This type contains progestin, a synthetic form of the hormone progesterone. This type can stay in place for 3-5 years.  Copper IUD.This type is wrapped in copper wire. It can stay in place for 10 years.  Permanent methods of  contraception Female tubal ligation In this method, a woman's fallopian tubes are sealed, tied, or blocked during surgery to prevent eggs from traveling to the uterus. Hysteroscopic sterilization In this method, a small, flexible insert is placed into each fallopian tube. The inserts cause scar tissue to form in the fallopian tubes and block them, so sperm cannot reach an egg. The procedure takes about 3 months to be effective. Another form of birth control must be used during those 3 months. Female sterilization This is a procedure to tie off the tubes that carry sperm (vasectomy). After the procedure, the man can still ejaculate fluid (semen). Natural planning methods Natural family planning In this method, a couple does not have sex on days when the woman could become pregnant. Calendar method This means keeping track of the   length of each menstrual cycle, identifying the days when pregnancy can happen, and not having sex on those days. Ovulation method In this method, a couple avoids sex during ovulation. Symptothermal method This method involves not having sex during ovulation. The woman typically checks for ovulation by watching changes in her temperature and in the consistency of cervical mucus. Post-ovulation method In this method, a couple waits to have sex until after ovulation. Summary  Contraception, also called birth control, means methods or devices that prevent pregnancy.  Hormonal methods of contraception include implants, injections, pills, patches, vaginal rings, and emergency contraceptives.  Barrier methods of contraception can include female condoms, female condoms, diaphragms, cervical caps, sponges, and spermicides.  There are two types of IUDs (intrauterine devices). An IUD can be put in a woman's uterus to prevent pregnancy for 3-5 years.  Permanent sterilization can be done through a procedure for males, females, or both.  Natural family planning methods involve  not having sex on days when the woman could become pregnant. This information is not intended to replace advice given to you by your health care provider. Make sure you discuss any questions you have with your health care provider. Document Released: 08/08/2005 Document Revised: 09/10/2016 Document Reviewed: 09/10/2016 Elsevier Interactive Patient Education  2018 [employer]   Breastfeeding Choosing to breastfeed is one of the best decisions you can make for yourself and your baby. A change in hormones during pregnancy causes your breasts to make breast milk in your milk-producing glands. Hormones prevent breast milk from being released before your baby is born. They also prompt milk flow after birth. Once breastfeeding has begun, thoughts of your baby, as well as his or her sucking or crying, can stimulate the release of milk from your milk-producing glands. Benefits of breastfeeding Research shows that breastfeeding offers many health benefits for infants and mothers. It also offers a cost-free and convenient way to feed your baby. For your baby  Your first milk (colostrum) helps your baby's digestive system to function better.  Special cells in your milk (antibodies) help your baby to fight off infections.  Breastfed babies are less likely to develop asthma, allergies, obesity, or type 2 diabetes. They are also at lower risk for sudden infant death syndrome (SIDS).  Nutrients in breast milk are better able to meet your baby's needs compared to infant formula.  Breast milk improves your baby's brain development. For you  Breastfeeding helps to create a very special bond between you and your baby.  Breastfeeding is convenient. Breast milk costs nothing and is always available at the correct temperature.  Breastfeeding helps to burn calories. It helps you to lose the weight that you gained during pregnancy.  Breastfeeding makes your uterus return faster to its size before pregnancy.  It also slows bleeding (lochia) after you give birth.  Breastfeeding helps to lower your risk of developing type 2 diabetes, osteoporosis, rheumatoid arthritis, cardiovascular disease, and breast, ovarian, uterine, and endometrial cancer later in life. Breastfeeding basics Starting breastfeeding  Find a comfortable place to sit or lie down, with your neck and back well-supported.  Place a pillow or a rolled-up blanket under your baby to bring him or her to the level of your breast (if you are seated). Nursing pillows are specially designed to help support your arms and your baby while you breastfeed.  Make sure that your baby's tummy (abdomen) is facing your abdomen.  Gently massage your breast. With your fingertips, massage from the outer edges of   your breast inward toward the nipple. This encourages milk flow. If your milk flows slowly, you may need to continue this action during the feeding.  Support your breast with 4 fingers underneath and your thumb above your nipple (make the letter "C" with your hand). Make sure your fingers are well away from your nipple and your baby's mouth.  Stroke your baby's lips gently with your finger or nipple.  When your baby's mouth is open wide enough, quickly bring your baby to your breast, placing your entire nipple and as much of the areola as possible into your baby's mouth. The areola is the colored area around your nipple. ? More areola should be visible above your baby's upper lip than below the lower lip. ? Your baby's lips should be opened and extended outward (flanged) to ensure an adequate, comfortable latch. ? Your baby's tongue should be between his or her lower gum and your breast.  Make sure that your baby's mouth is correctly positioned around your nipple (latched). Your baby's lips should create a seal on your breast and be turned out (everted).  It is common for your baby to suck about 2-3 minutes in order to start the flow of breast  milk. Latching Teaching your baby how to latch onto your breast properly is very important. An improper latch can cause nipple pain, decreased milk supply, and poor weight gain in your baby. Also, if your baby is not latched onto your nipple properly, he or she may swallow some air during feeding. This can make your baby fussy. Burping your baby when you switch breasts during the feeding can help to get rid of the air. However, teaching your baby to latch on properly is still the best way to prevent fussiness from swallowing air while breastfeeding. Signs that your baby has successfully latched onto your nipple  Silent tugging or silent sucking, without causing you pain. Infant's lips should be extended outward (flanged).  Swallowing heard between every 3-4 sucks once your milk has started to flow (after your let-down milk reflex occurs).  Muscle movement above and in front of his or her ears while sucking.  Signs that your baby has not successfully latched onto your nipple  Sucking sounds or smacking sounds from your baby while breastfeeding.  Nipple pain.  If you think your baby has not latched on correctly, slip your finger into the corner of your baby's mouth to break the suction and place it between your baby's gums. Attempt to start breastfeeding again. Signs of successful breastfeeding Signs from your baby  Your baby will gradually decrease the number of sucks or will completely stop sucking.  Your baby will fall asleep.  Your baby's body will relax.  Your baby will retain a small amount of milk in his or her mouth.  Your baby will let go of your breast by himself or herself.  Signs from you  Breasts that have increased in firmness, weight, and size 1-3 hours after feeding.  Breasts that are softer immediately after breastfeeding.  Increased milk volume, as well as a change in milk consistency and color by the fifth day of breastfeeding.  Nipples that are not sore,  cracked, or bleeding.  Signs that your baby is getting enough milk  Wetting at least 1-2 diapers during the first 24 hours after birth.  Wetting at least 5-6 diapers every 24 hours for the first week after birth. The urine should be clear or pale yellow by the age of 5   days.  Wetting 6-8 diapers every 24 hours as your baby continues to grow and develop.  At least 3 stools in a 24-hour period by the age of 5 days. The stool should be soft and yellow.  At least 3 stools in a 24-hour period by the age of 7 days. The stool should be seedy and yellow.  No loss of weight greater than 10% of birth weight during the first 3 days of life.  Average weight gain of 4-7 oz (113-198 g) per week after the age of 4 days.  Consistent daily weight gain by the age of 5 days, without weight loss after the age of 2 weeks. After a feeding, your baby may spit up a small amount of milk. This is normal. Breastfeeding frequency and duration Frequent feeding will help you make more milk and can prevent sore nipples and extremely full breasts (breast engorgement). Breastfeed when you feel the need to reduce the fullness of your breasts or when your baby shows signs of hunger. This is called "breastfeeding on demand." Signs that your baby is hungry include:  Increased alertness, activity, or restlessness.  Movement of the head from side to side.  Opening of the mouth when the corner of the mouth or cheek is stroked (rooting).  Increased sucking sounds, smacking lips, cooing, sighing, or squeaking.  Hand-to-mouth movements and sucking on fingers or hands.  Fussing or crying.  Avoid introducing a pacifier to your baby in the first 4-6 weeks after your baby is born. After this time, you may choose to use a pacifier. Research has shown that pacifier use during the first year of a baby's life decreases the risk of sudden infant death syndrome (SIDS). Allow your baby to feed on each breast as long as he or she  wants. When your baby unlatches or falls asleep while feeding from the first breast, offer the second breast. Because newborns are often sleepy in the first few weeks of life, you may need to awaken your baby to get him or her to feed. Breastfeeding times will vary from baby to baby. However, the following rules can serve as a guide to help you make sure that your baby is properly fed:  Newborns (babies 4 weeks of age or younger) may breastfeed every 1-3 hours.  Newborns should not go without breastfeeding for longer than 3 hours during the day or 5 hours during the night.  You should breastfeed your baby a minimum of 8 times in a 24-hour period.  Breast milk pumping Pumping and storing breast milk allows you to make sure that your baby is exclusively fed your breast milk, even at times when you are unable to breastfeed. This is especially important if you go back to work while you are still breastfeeding, or if you are not able to be present during feedings. Your lactation consultant can help you find a method of pumping that works best for you and give you guidelines about how long it is safe to store breast milk. Caring for your breasts while you breastfeed Nipples can become dry, cracked, and sore while breastfeeding. The following recommendations can help keep your breasts moisturized and healthy:  Avoid using soap on your nipples.  Wear a supportive bra designed especially for nursing. Avoid wearing underwire-style bras or extremely tight bras (sports bras).  Air-dry your nipples for 3-4 minutes after each feeding.  Use only cotton bra pads to absorb leaked breast milk. Leaking of breast milk between feedings is normal.    Use lanolin on your nipples after breastfeeding. Lanolin helps to maintain your skin's normal moisture barrier. Pure lanolin is not harmful (not toxic) to your baby. You may also hand express a few drops of breast milk and gently massage that milk into your nipples and  allow the milk to air-dry.  In the first few weeks after giving birth, some women experience breast engorgement. Engorgement can make your breasts feel heavy, warm, and tender to the touch. Engorgement peaks within 3-5 days after you give birth. The following recommendations can help to ease engorgement:  Completely empty your breasts while breastfeeding or pumping. You may want to start by applying warm, moist heat (in the shower or with warm, water-soaked hand towels) just before feeding or pumping. This increases circulation and helps the milk flow. If your baby does not completely empty your breasts while breastfeeding, pump any extra milk after he or she is finished.  Apply ice packs to your breasts immediately after breastfeeding or pumping, unless this is too uncomfortable for you. To do this: ? Put ice in a plastic bag. ? Place a towel between your skin and the bag. ? Leave the ice on for 20 minutes, 2-3 times a day.  Make sure that your baby is latched on and positioned properly while breastfeeding.  If engorgement persists after 48 hours of following these recommendations, contact your health care provider or a lactation consultant. Overall health care recommendations while breastfeeding  Eat 3 healthy meals and 3 snacks every day. Well-nourished mothers who are breastfeeding need an additional 450-500 calories a day. You can meet this requirement by increasing the amount of a balanced diet that you eat.  Drink enough water to keep your urine pale yellow or clear.  Rest often, relax, and continue to take your prenatal vitamins to prevent fatigue, stress, and low vitamin and mineral levels in your body (nutrient deficiencies).  Do not use any products that contain nicotine or tobacco, such as cigarettes and e-cigarettes. Your baby may be harmed by chemicals from cigarettes that pass into breast milk and exposure to secondhand smoke. If you need help quitting, ask your health care  provider.  Avoid alcohol.  Do not use illegal drugs or marijuana.  Talk with your health care provider before taking any medicines. These include over-the-counter and prescription medicines as well as vitamins and herbal supplements. Some medicines that may be harmful to your baby can pass through breast milk.  It is possible to become pregnant while breastfeeding. If birth control is desired, ask your health care provider about options that will be safe while breastfeeding your baby. Where to find more information: [employer]: www.llli.org Contact a health care provider if:  You feel like you want to stop breastfeeding or have become frustrated with breastfeeding.  Your nipples are cracked or bleeding.  Your breasts are red, tender, or warm.  You have: ? Painful breasts or nipples. ? A swollen area on either breast. ? A fever or chills. ? Nausea or vomiting. ? Drainage other than breast milk from your nipples.  Your breasts do not become full before feedings by the fifth day after you give birth.  You feel sad and depressed.  Your baby is: ? Too sleepy to eat well. ? Having trouble sleeping. ? More than 1 week old and wetting fewer than 6 diapers in a 24-hour period. ? Not gaining weight by 5 days of age.  Your baby has fewer than 3 stools in a   24-hour period.  Your baby's skin or the white parts of his or her eyes become yellow. Get help right away if:  Your baby is overly tired (lethargic) and does not want to wake up and feed.  Your baby develops an unexplained fever. Summary  Breastfeeding offers many health benefits for infant and mothers.  Try to breastfeed your infant when he or she shows early signs of hunger.  Gently tickle or stroke your baby's lips with your finger or nipple to allow the baby to open his or her mouth. Bring the baby to your breast. Make sure that much of the areola is in your baby's mouth. Offer one side and burp the  baby before you offer the other side.  Talk with your health care provider or lactation consultant if you have questions or you face problems as you breastfeed. This information is not intended to replace advice given to you by your health care provider. Make sure you discuss any questions you have with your health care provider. Document Released: 08/08/2005 Document Revised: 09/09/2016 Document Reviewed: 09/09/2016 Elsevier Interactive Patient Education  2018 [employer] Inc.  

## 2017-10-18 NOTE — Progress Notes (Signed)
  Subjective:    Gail Perez is a [id number] 6136w2d being seen today for her first obstetrical visit.  Her obstetrical history is significant for history of PIH with her first pregnancy and LEEP in 2015 followed by a normal SVD in 2017. Patient does intend to breast feed. Pregnancy history fully reviewed.  Patient reports some nausea and emesis.  Vitals:   10/18/17 1511  BP: (!) 115/57  Pulse: 76  Weight: 204 lb (92.5 kg)    HISTORY: OB History  Gravida Para Term Preterm AB Living  3 2 2  0 0 2  SAB TAB Ectopic Multiple Live Births  0 0 0 0 2    # Outcome Date GA Lbr Len/2nd Weight Sex Delivery Anes PTL Lv  3 Current           2 Term 02/16/16 6130w1d / 00:21 8 lb 1.1 oz (3.66 kg) M Vag-Spont EPI  LIV  1 Term 12/30/12 6725w0d / 01:15 6 lb 11.8 oz (3.055 kg) M Vag-Spont EPI  LIV     Past Medical History:  Diagnosis Date  . Allergy   . Chlamydia   . Hx of varicella   . Pregnancy induced hypertension 2014  . Seasonal allergies   . Urinary tract infection   . Vaginal Pap smear, abnormal    Past Surgical History:  Procedure Laterality Date  . LEEP     Family History  Problem Relation Age of Onset  . Hypertension Father   . Hyperlipidemia Father   . Heart disease Father        enlatged heart  . Sarcoidosis Father   . Anemia Mother   . Cancer Maternal Grandfather        lung  . Hypertension Sister   . Other Neg Hx      Exam    Uterus:   10-week size  Pelvic Exam:    Perineum: Normal Perineum   Vulva: normal   Vagina:  normal mucosa, normal discharge   pH:    Cervix: multiparous appearance   Adnexa: normal adnexa and no mass, fullness, tenderness   Bony Pelvis: gynecoid  System: Breast:  normal appearance, no masses or tenderness   Skin: normal coloration and turgor, no rashes    Neurologic: oriented, no focal deficits   Extremities: normal strength, tone, and muscle mass   HEENT extra ocular movement intact   Mouth/Teeth mucous membranes moist, pharynx normal  without lesions and dental hygiene good   Neck supple and no masses   Cardiovascular: regular rate and rhythm   Respiratory:  chest clear, no wheezing, crepitations, rhonchi, normal symmetric air entry   Abdomen: soft, non-tender; bowel sounds normal; no masses,  no organomegaly   Urinary:       Assessment:    Pregnancy: [id number] Patient Active Problem List   Diagnosis Date Noted  . Supervision of other normal pregnancy, antepartum 10/18/2017  . H/O LEEP 11/23/2015  . History of pregnancy induced hypertension 11/22/2015        Plan:     Initial labs drawn. Prenatal vitamins. Problem list reviewed and updated. Genetic Screening discussed : Panorama ordered.  Ultrasound discussed; fetal survey: requested. Rx for vitamin B6 and unisom provided for nausea Patient instructed to start ASA in 2 weeks Patient declined flu vaccine  Follow up in 4 weeks. 50% of 30 min visit spent on counseling and coordination of care.     Timmie Dugue 10/18/2017

## 2017-10-23 LAB — CYTOLOGY - PAP: Diagnosis: NEGATIVE

## 2017-10-23 LAB — URINE CULTURE, OB REFLEX

## 2017-10-23 LAB — CULTURE, OB URINE

## 2017-10-27 LAB — OBSTETRIC PANEL, INCLUDING HIV
ANTIBODY SCREEN: NEGATIVE
BASOS ABS: 0.1 10*3/uL (ref 0.0–0.2)
BASOS: 1 %
EOS (ABSOLUTE): 0.2 10*3/uL (ref 0.0–0.4)
Eos: 2 %
HIV Screen 4th Generation wRfx: NONREACTIVE
Hematocrit: 37.5 % (ref 34.0–46.6)
Hemoglobin: 12.6 g/dL (ref 11.1–15.9)
Hepatitis B Surface Ag: NEGATIVE
IMMATURE GRANS (ABS): 0 10*3/uL (ref 0.0–0.1)
Immature Granulocytes: 0 %
LYMPHS: 22 %
Lymphocytes Absolute: 2.2 10*3/uL (ref 0.7–3.1)
MCH: 28.1 pg (ref 26.6–33.0)
MCHC: 33.6 g/dL (ref 31.5–35.7)
MCV: 84 fL (ref 79–97)
MONOCYTES: 9 %
MONOS ABS: 0.9 10*3/uL (ref 0.1–0.9)
Neutrophils Absolute: 6.6 10*3/uL (ref 1.4–7.0)
Neutrophils: 66 %
PLATELETS: 415 10*3/uL — AB (ref 150–379)
RBC: 4.49 x10E6/uL (ref 3.77–5.28)
RDW: 14.6 % (ref 12.3–15.4)
RPR Ser Ql: NONREACTIVE
RUBELLA: 6.74 {index} (ref >0.99)
Rh Factor: POSITIVE
WBC: 9.9 10*3/uL (ref 3.4–10.8)

## 2017-10-27 LAB — SMN1 COPY NUMBER ANALYSIS (SMA CARRIER SCREENING)

## 2017-10-27 LAB — CYSTIC FIBROSIS MUTATION 97

## 2017-10-30 ENCOUNTER — Telehealth: Payer: Self-pay

## 2017-10-30 ENCOUNTER — Encounter: Payer: Self-pay | Admitting: Obstetrics and Gynecology

## 2017-10-30 NOTE — Telephone Encounter (Signed)
Patient called and made aware of normal low risk Panorama results. Patient also made aware that gender is female. Gail StammerJennifer Torian Quintero RNBSN

## 2017-11-15 ENCOUNTER — Ambulatory Visit (INDEPENDENT_AMBULATORY_CARE_PROVIDER_SITE_OTHER): Payer: BLUE CROSS/BLUE SHIELD | Admitting: Obstetrics & Gynecology

## 2017-11-15 ENCOUNTER — Encounter: Payer: Self-pay | Admitting: Obstetrics & Gynecology

## 2017-11-15 VITALS — BP 128/67 | HR 70 | Wt 204.0 lb

## 2017-11-15 DIAGNOSIS — Z3689 Encounter for other specified antenatal screening: Secondary | ICD-10-CM

## 2017-11-15 DIAGNOSIS — Z3482 Encounter for supervision of other normal pregnancy, second trimester: Secondary | ICD-10-CM

## 2017-11-15 NOTE — Progress Notes (Signed)
Patient complaining of pain on left lower side of abdomen that comes and goes. RN

## 2017-11-15 NOTE — Progress Notes (Signed)
   PRENATAL VISIT NOTE  Subjective:  Gail Perez is a 27 y.o. G3P2002 at 8535w2d being seen today for ongoing prenatal care.  She is currently monitored for the following issues for this low-risk pregnancy and has History of pregnancy induced hypertension; H/O LEEP; and Supervision of other normal pregnancy, antepartum on their problem list.  Patient reports left sided pelvic pain sometimes. No radiation, no UTI symptoms.  Contractions: Not present. Vag. Bleeding: None.   . Denies leaking of fluid.   The following portions of the patient's history were reviewed and updated as appropriate: allergies, current medications, past family history, past medical history, past social history, past surgical history and problem list. Problem list updated.  Objective:   Vitals:   11/15/17 1450  BP: 128/67  Pulse: 70  Weight: 204 lb (92.5 kg)    Fetal Status: Fetal Heart Rate (bpm): 158         General:  Alert, oriented and cooperative. Patient is in no acute distress.  Skin: Skin is warm and dry. No rash noted.   Cardiovascular: Normal heart rate noted  Respiratory: Normal respiratory effort, no problems with respiration noted  Abdomen: Soft, gravid, appropriate for gestational age.  Pain/Pressure: Present     Pelvic: Cervical exam deferred        Extremities: Normal range of motion.  Edema: None  Mental Status:  Normal mood and affect. Normal behavior. Normal judgment and thought content.   Assessment and Plan:  Pregnancy: G3P2002 at 6635w2d  1. Encounter for fetal anatomic survey Anatomy scan ordered - US MFM OB COMP + 14 WK; Future  2. Supervision of normal intrauterine pregnancy in multigravida in second trimester NIPS showed low risk female.  Reassured that pain is likely round ligament pain. No other complaints or concerns.  Routine obstetric precautions reviewed. Please refer to After Visit Summary for other counseling recommendations.  Return in about 1 month (around 12/13/2017) for  OB Visit.   Jaynie CollinsUgonna Hoda Hon, MD

## 2017-11-15 NOTE — Patient Instructions (Signed)

## 2017-12-05 ENCOUNTER — Encounter (HOSPITAL_COMMUNITY): Payer: Self-pay | Admitting: *Deleted

## 2017-12-05 ENCOUNTER — Inpatient Hospital Stay (HOSPITAL_COMMUNITY)
Admission: AD | Admit: 2017-12-05 | Discharge: 2017-12-05 | Disposition: A | Discharge status: Home / Self Care | Payer: BLUE CROSS/BLUE SHIELD | Source: Ambulatory Visit | Attending: Family Medicine | Admitting: Family Medicine

## 2017-12-05 ENCOUNTER — Other Ambulatory Visit: Payer: Self-pay

## 2017-12-05 DIAGNOSIS — Z3A17 17 weeks gestation of pregnancy: Secondary | ICD-10-CM | POA: Diagnosis not present

## 2017-12-05 DIAGNOSIS — O26892 Other specified pregnancy related conditions, second trimester: Secondary | ICD-10-CM | POA: Diagnosis not present

## 2017-12-05 DIAGNOSIS — Z8759 Personal history of other complications of pregnancy, childbirth and the puerperium: Secondary | ICD-10-CM

## 2017-12-05 DIAGNOSIS — R102 Pelvic and perineal pain: Secondary | ICD-10-CM | POA: Insufficient documentation

## 2017-12-05 DIAGNOSIS — N949 Unspecified condition associated with female genital organs and menstrual cycle: Secondary | ICD-10-CM

## 2017-12-05 DIAGNOSIS — Z3480 Encounter for supervision of other normal pregnancy, unspecified trimester: Secondary | ICD-10-CM

## 2017-12-05 LAB — CBC
HCT: 36.4 % (ref 36.0–46.0)
Hemoglobin: 12.2 g/dL (ref 12.0–15.0)
MCH: 27.8 pg (ref 26.0–34.0)
MCHC: 33.5 g/dL (ref 30.0–36.0)
MCV: 82.9 fL (ref 78.0–100.0)
PLATELETS: 309 10*3/uL (ref 150–400)
RBC: 4.39 MIL/uL (ref 3.87–5.11)
RDW: 14 % (ref 11.5–15.5)
WBC: 10.9 10*3/uL — AB (ref 4.0–10.5)

## 2017-12-05 LAB — URINALYSIS, ROUTINE W REFLEX MICROSCOPIC
Bilirubin Urine: NEGATIVE
GLUCOSE, UA: NEGATIVE mg/dL
Hgb urine dipstick: NEGATIVE
Ketones, ur: NEGATIVE mg/dL
LEUKOCYTES UA: NEGATIVE
NITRITE: NEGATIVE
PH: 7 (ref 5.0–8.0)
Protein, ur: NEGATIVE mg/dL
SPECIFIC GRAVITY, URINE: 1.021 (ref 1.005–1.030)

## 2017-12-05 LAB — COMPREHENSIVE METABOLIC PANEL
ALT: 13 U/L — AB (ref 14–54)
AST: 16 U/L (ref 15–41)
Albumin: 3.3 g/dL — ABNORMAL LOW (ref 3.5–5.0)
Alkaline Phosphatase: 53 U/L (ref 38–126)
Anion gap: 10 (ref 5–15)
BUN: 7 mg/dL (ref 6–20)
CHLORIDE: 104 mmol/L (ref 101–111)
CO2: 22 mmol/L (ref 22–32)
Calcium: 9.4 mg/dL (ref 8.9–10.3)
Creatinine, Ser: 0.66 mg/dL (ref 0.44–1.00)
GFR calc non Af Amer: >60 mL/min (ref >60)
Glucose, Bld: 80 mg/dL (ref 65–99)
POTASSIUM: 4.4 mmol/L (ref 3.5–5.1)
SODIUM: 136 mmol/L (ref 135–145)
Total Bilirubin: 0.5 mg/dL (ref 0.3–1.2)
Total Protein: 7.4 g/dL (ref 6.5–8.1)

## 2017-12-05 LAB — WET PREP, GENITAL
CLUE CELLS WET PREP: NONE SEEN
Sperm: NONE SEEN
TRICH WET PREP: NONE SEEN
Yeast Wet Prep HPF POC: NONE SEEN

## 2017-12-05 NOTE — MAU Note (Signed)
Sharp stabbing pain in pelvis, left side and up. Started about an hour ago.

## 2017-12-05 NOTE — MAU Provider Note (Addendum)
[name] is a 27 y.o.  G3P2002 At [redacted]w[redacted]d here with complaints of LLQ pain that started this morning at 930. She denies VB, LOF, abnormal discharge, dysuria, N/V, constipation or other ob-gyn complaint.  History     CSN: [id number]  Arrival date and time: 12/05/17 1129   First Provider Initiated Contact with Patient 12/05/17 1301      Chief Complaint  Patient presents with  . Pelvic Pain   Abdominal Pain  This is a new problem. The current episode started today. The onset quality is sudden. The problem occurs intermittently. The problem has been unchanged. The pain is located in the LLQ. The pain is at a severity of 8/10. The quality of the pain is sharp. The abdominal pain does not radiate. Pertinent negatives include no constipation, diarrhea, dysuria, headaches, nausea or vomiting. Nothing aggravates the pain. The pain is relieved by vomiting. She has tried nothing for the symptoms.    OB History    Gravida  3   Para  2   Term  2   Preterm  0   AB  0   Living  2     SAB  0   TAB  0   Ectopic  0   Multiple  0   Live Births  2           Past Medical History:  Diagnosis Date  . Allergy   . Chlamydia   . Hx of varicella   . Pregnancy induced hypertension 2014  . Seasonal allergies   . Urinary tract infection   . Vaginal Pap smear, abnormal     Past Surgical History:  Procedure Laterality Date  . LEEP      Family History  Problem Relation Age of Onset  . Hypertension Father   . Hyperlipidemia Father   . Heart disease Father        enlatged heart  . Sarcoidosis Father   . Anemia Mother   . Cancer Maternal Grandfather        lung  . Hypertension Sister   . Other Neg Hx     Social History   Tobacco Use  . Smoking status: Never Smoker  . Smokeless tobacco: Never Used  Substance Use Topics  . Alcohol use: No    Comment: occasional  . Drug use: No    Allergies: No Known Allergies  Medications Prior to Admission  Medication  Sig Dispense Refill Last Dose  . aspirin EC 81 MG tablet Take 1 tablet (81 mg total) by mouth daily. Take after 12 weeks for prevention of preeclampsia later in pregnancy 300 tablet 2 Taking  . doxylamine, Sleep, (UNISOM SLEEPTABS) 25 MG tablet Take 1 tablet (25 mg total) by mouth at bedtime as needed. 30 tablet 0 Taking  . omeprazole (PRILOSEC) 20 MG capsule Take 1 capsule (20 mg total) by mouth daily. (Patient not taking: Reported on 10/18/2017) 30 capsule 1 Not Taking  . Prenat w/o A-FE-Methfol-FA-DHA (PNV-DHA) 27-0.6-0.4-300 MG CAPS Take 1 tablet by mouth daily. 30 capsule 6 Taking  . pyridOXINE (VITAMIN B-6) 25 MG tablet Take 1 tablet (25 mg total) by mouth daily. 30 tablet 3 Taking    Review of Systems  Constitutional: Negative.   HENT: Negative.   Gastrointestinal: Positive for abdominal pain. Negative for constipation, diarrhea, nausea and vomiting.  Endocrine: Negative.   Genitourinary: Negative for dysuria.  Neurological: Negative for headaches.  Psychiatric/Behavioral: Negative.    Physical Exam   Blood pressure Marland Kitchen)  115/58, pulse 75, temperature 97.8 F (36.6 C), temperature source Oral, resp. rate 18, weight 208 lb 12 oz (94.7 kg), last menstrual period 08/07/2017, SpO2 100 %, not currently breastfeeding.  Physical Exam  Constitutional: She is oriented to person, place, and time. She appears well-developed.  HENT:  Head: Normocephalic.  Eyes: Pupils are equal, round, and reactive to light.  Neck: Normal range of motion.  Respiratory: Effort normal.  GI: Soft. Bowel sounds are normal.  Genitourinary:  Genitourinary Comments: NEFG; no discharge in the vagina. No CMT, no suprapubic or adnexal tenderness. Cervix is closed, long and thick.   Musculoskeletal: Normal range of motion.  Neurological: She is alert and oriented to person, place, and time.  Skin: Skin is warm.    MAU Course  Procedures  MDM -CBC, CMP: normal -wet prep: negative -GC Chlamydia pending -UA  normal -FHR is 154 Assessment and Plan   1. Round ligament pain   2. Supervision of other normal pregnancy, antepartum   3. History of pregnancy induced hypertension    2. Patient stable for discharge with recommendations to try heat, tylenol and maternity support belt.   3. Reviewed warning signs and when to return to MAU.   4. All questions answered.     Charlesetta GaribaldiKathryn Lorraine Kooistra 12/05/2017, 1:16 PM

## 2017-12-05 NOTE — Discharge Instructions (Signed)
-  purchase maternity support belt  Round Ligament Pain The round ligament is a cord of muscle and tissue that helps to support the uterus. It can become a source of pain during pregnancy if it becomes stretched or twisted as the baby grows. The pain usually begins in the second trimester of pregnancy, and it can come and go until the baby is delivered. It is not a serious problem, and it does not cause harm to the baby. Round ligament pain is usually a short, sharp, and pinching pain, but it can also be a dull, lingering, and aching pain. The pain is felt in the lower side of the abdomen or in the groin. It usually starts deep in the groin and moves up to the outside of the hip area. Pain can occur with:  A sudden change in position.  Rolling over in bed.  Coughing or sneezing.  Physical activity.  Follow these instructions at home: Watch your condition for any changes. Take these steps to help with your pain:  When the pain starts, relax. Then try: ? Sitting down. ? Flexing your knees up to your abdomen. ? Lying on your side with one pillow under your abdomen and another pillow between your legs. ? Sitting in a warm bath for 15-20 minutes or until the pain goes away.  Take over-the-counter and prescription medicines only as told by your health care provider.  Move slowly when you sit and stand.  Avoid long walks if they cause pain.  Stop or lessen your physical activities if they cause pain.  Contact a health care provider if:  Your pain does not go away with treatment.  You feel pain in your back that you did not have before.  Your medicine is not helping. Get help right away if:  You develop a fever or chills.  You develop uterine contractions.  You develop vaginal bleeding.  You develop nausea or vomiting.  You develop diarrhea.  You have pain when you urinate. This information is not intended to replace advice given to you by your health care provider. Make sure  you discuss any questions you have with your health care provider. Document Released: 05/17/2008 Document Revised: 01/14/2016 Document Reviewed: 10/15/2014 Elsevier Interactive Patient Education  [employer]2018 [employer] Inc[employer]

## 2017-12-06 LAB — GC/CHLAMYDIA PROBE AMP (~~LOC~~) NOT AT ARMC
CHLAMYDIA, DNA PROBE: NEGATIVE
NEISSERIA GONORRHEA: NEGATIVE

## 2017-12-18 ENCOUNTER — Other Ambulatory Visit: Payer: Self-pay | Admitting: Obstetrics & Gynecology

## 2017-12-18 ENCOUNTER — Ambulatory Visit (HOSPITAL_COMMUNITY)
Admission: RE | Admit: 2017-12-18 | Discharge: 2017-12-18 | Disposition: A | Discharge status: Home / Self Care | Payer: BLUE CROSS/BLUE SHIELD | Source: Ambulatory Visit | Attending: Obstetrics & Gynecology | Admitting: Obstetrics & Gynecology

## 2017-12-18 ENCOUNTER — Ambulatory Visit (INDEPENDENT_AMBULATORY_CARE_PROVIDER_SITE_OTHER): Payer: BLUE CROSS/BLUE SHIELD | Admitting: Family Medicine

## 2017-12-18 VITALS — BP 127/64 | HR 79 | Wt 212.0 lb

## 2017-12-18 DIAGNOSIS — Z9889 Other specified postprocedural states: Secondary | ICD-10-CM

## 2017-12-18 DIAGNOSIS — Z3482 Encounter for supervision of other normal pregnancy, second trimester: Secondary | ICD-10-CM

## 2017-12-18 DIAGNOSIS — Z363 Encounter for antenatal screening for malformations: Secondary | ICD-10-CM | POA: Diagnosis not present

## 2017-12-18 DIAGNOSIS — Z8759 Personal history of other complications of pregnancy, childbirth and the puerperium: Secondary | ICD-10-CM

## 2017-12-18 DIAGNOSIS — Z3A19 19 weeks gestation of pregnancy: Secondary | ICD-10-CM | POA: Diagnosis not present

## 2017-12-18 DIAGNOSIS — Z3689 Encounter for other specified antenatal screening: Secondary | ICD-10-CM

## 2017-12-18 NOTE — Progress Notes (Signed)
   PRENATAL VISIT NOTE  Subjective:  Zamorah Ailes is a 27 y.o. G3P2002 at [redacted]w[redacted]d being seen today for ongoing prenatal care.  She is currently monitored for the following issues for this low-risk pregnancy and has History of pregnancy induced hypertension; H/O LEEP; and Supervision of other normal pregnancy, antepartum on their problem list.  Patient reports no complaints.  Contractions: Not present. Vag. Bleeding: None.   . Denies leaking of fluid.   The following portions of the patient's history were reviewed and updated as appropriate: allergies, current medications, past family history, past medical history, past social history, past surgical history and problem list. Problem list updated.  Objective:   Vitals:   12/18/17 1040  BP: 127/64  Pulse: 79  Weight: 212 lb (96.2 kg)    Fetal Status: Fetal Heart Rate (bpm): 145 Fundal Height: 20 cm       General:  Alert, oriented and cooperative. Patient is in no acute distress.  Skin: Skin is warm and dry. No rash noted.   Cardiovascular: Normal heart rate noted  Respiratory: Normal respiratory effort, no problems with respiration noted  Abdomen: Soft, gravid, appropriate for gestational age.  Pain/Pressure: Absent     Pelvic: Cervical exam deferred        Extremities: Normal range of motion.  Edema: Trace  Mental Status: Normal mood and affect. Normal behavior. Normal judgment and thought content.   Assessment and Plan:  Pregnancy: G3P2002 at [redacted]w[redacted]d  1. Supervision of normal intrauterine pregnancy in multigravida in second trimester FHT and FH normal. [place] today. Had CF testing in prior pregnancy - no need to repeat today.  2. H/O LEEP  3. History of pregnancy induced hypertension ASA   Preterm labor symptoms and general obstetric precautions including but not limited to vaginal bleeding, contractions, leaking of fluid and fetal movement were reviewed in detail with the patient. Please refer to After Visit Summary for other  counseling recommendations.  No follow-ups on file.  Future Appointments  Date Time Provider Department Center  12/18/2017  1:15 PM [facility] [place] 4 [facility]    Levie Heritage, DO

## 2018-01-17 ENCOUNTER — Ambulatory Visit (INDEPENDENT_AMBULATORY_CARE_PROVIDER_SITE_OTHER): Payer: BLUE CROSS/BLUE SHIELD | Admitting: Obstetrics & Gynecology

## 2018-01-17 VITALS — BP 113/59 | HR 84 | Wt 217.4 lb

## 2018-01-17 DIAGNOSIS — Z9889 Other specified postprocedural states: Secondary | ICD-10-CM

## 2018-01-17 DIAGNOSIS — Z8759 Personal history of other complications of pregnancy, childbirth and the puerperium: Secondary | ICD-10-CM

## 2018-01-17 DIAGNOSIS — Z3480 Encounter for supervision of other normal pregnancy, unspecified trimester: Secondary | ICD-10-CM

## 2018-01-17 NOTE — Progress Notes (Signed)
   PRENATAL VISIT NOTE  Subjective:  Gail Perez is a 27 y.o. G3P2002 at [redacted]w[redacted]d being seen today for ongoing prenatal care.  She is currently monitored for the following issues for this high-risk pregnancy and has History of pregnancy induced hypertension; H/O LEEP; and Supervision of other normal pregnancy, antepartum on their problem list.  Patient reports some swelling.  .  Contractions: Not present. Vag. Bleeding: None.  Movement: Present. Denies leaking of fluid.   The following portions of the patient's history were reviewed and updated as appropriate: allergies, current medications, past family history, past medical history, past social history, past surgical history and problem list. Problem list updated.  Objective:   Vitals:   01/17/18 1514  BP: (!) 113/59  Pulse: 84  Weight: 217 lb 6.4 oz (98.6 kg)    Fetal Status: Fetal Heart Rate (bpm): 134   Movement: Present     General:  Alert, oriented and cooperative. Patient is in no acute distress.  Skin: Skin is warm and dry. No rash noted.   Cardiovascular: Normal heart rate noted  Respiratory: Normal respiratory effort, no problems with respiration noted  Abdomen: Soft, gravid, appropriate for gestational age.  Pain/Pressure: Present     Pelvic: Cervical exam deferred        Extremities: Normal range of motion.  Edema: Trace  Mental Status: Normal mood and affect. Normal behavior. Normal judgment and thought content.   Assessment and Plan:  Pregnancy: G3P2002 at [redacted]w[redacted]d  1. Supervision of other normal pregnancy, antepartum Need anatomy completion Needs 2 hour GTT at next visit. Pt told to come fasting  2. History of pregnancy induced hypertension Pt is not taking baby ASA. I reviewed the need for her and she says she will take it.    3. H/O LEEP S/p SVD after the LEEP without difficulty  Preterm labor symptoms and general obstetric precautions including but not limited to vaginal bleeding, contractions, leaking of fluid  and fetal movement were reviewed in detail with the patient. Please refer to After Visit Summary for other counseling recommendations.  Return in about 1 month (around 02/14/2018).  No future appointments.  Willodean Rosenthal, MD

## 2018-01-17 NOTE — Patient Instructions (Signed)
Second Trimester of Pregnancy The second trimester is from week 13 through week 28, month 4 through 6. This is often the time in pregnancy that you feel your best. Often times, morning sickness has lessened or quit. You may have more energy, and you may get hungry more often. Your unborn baby (fetus) is growing rapidly. At the end of the sixth month, he or she is about 9 inches long and weighs about 1 pounds. You will likely feel the baby move (quickening) between 18 and 20 weeks of pregnancy. Follow these instructions at home:  Avoid all smoking, herbs, and alcohol. Avoid drugs not approved by your doctor.  Do not use any tobacco products, including cigarettes, chewing tobacco, and electronic cigarettes. If you need help quitting, ask your doctor. You may get counseling or other support to help you quit.  Only take medicine as told by your doctor. Some medicines are safe and some are not during pregnancy.  Exercise only as told by your doctor. Stop exercising if you start having cramps.  Eat regular, healthy meals.  Wear a good support bra if your breasts are tender.  Do not use hot tubs, steam rooms, or saunas.  Wear your seat belt when driving.  Avoid raw meat, uncooked cheese, and liter boxes and soil used by cats.  Take your prenatal vitamins.  Take 1500-2000 milligrams of calcium daily starting at the 20th week of pregnancy until you deliver your baby.  Try taking medicine that helps you poop (stool softener) as needed, and if your doctor approves. Eat more fiber by eating fresh fruit, vegetables, and whole grains. Drink enough fluids to keep your pee (urine) clear or pale yellow.  Take warm water baths (sitz baths) to soothe pain or discomfort caused by hemorrhoids. Use hemorrhoid cream if your doctor approves.  If you have puffy, bulging veins (varicose veins), wear support hose. Raise (elevate) your feet for 15 minutes, 3-4 times a day. Limit salt in your diet.  Avoid heavy  lifting, wear low heals, and sit up straight.  Rest with your legs raised if you have leg cramps or low back pain.  Visit your dentist if you have not gone during your pregnancy. Use a soft toothbrush to brush your teeth. Be gentle when you floss.  You can have sex (intercourse) unless your doctor tells you not to.  Go to your doctor visits. Get help if:  You feel dizzy.  You have mild cramps or pressure in your lower belly (abdomen).  You have a nagging pain in your belly area.  You continue to feel sick to your stomach (nauseous), throw up (vomit), or have watery poop (diarrhea).  You have bad smelling fluid coming from your vagina.  You have pain with peeing (urination). Get help right away if:  You have a fever.  You are leaking fluid from your vagina.  You have spotting or bleeding from your vagina.  You have severe belly cramping or pain.  You lose or gain weight rapidly.  You have trouble catching your breath and have chest pain.  You notice sudden or extreme puffiness (swelling) of your face, hands, ankles, feet, or legs.  You have not felt the baby move in over an hour.  You have severe headaches that do not go away with medicine.  You have vision changes. This information is not intended to replace advice given to you by your health care provider. Make sure you discuss any questions you have with your health care   provider. Document Released: 11/02/2009 Document Revised: 01/14/2016 Document Reviewed: 10/09/2012 Elsevier Interactive Patient Education  2017 [employer] Inc.  

## 2018-01-22 ENCOUNTER — Ambulatory Visit (HOSPITAL_COMMUNITY)
Admission: RE | Admit: 2018-01-22 | Discharge: 2018-01-22 | Disposition: A | Discharge status: Home / Self Care | Payer: BLUE CROSS/BLUE SHIELD | Source: Ambulatory Visit | Attending: Obstetrics & Gynecology | Admitting: Obstetrics & Gynecology

## 2018-01-22 DIAGNOSIS — Z3A24 24 weeks gestation of pregnancy: Secondary | ICD-10-CM | POA: Diagnosis not present

## 2018-01-22 DIAGNOSIS — Z3480 Encounter for supervision of other normal pregnancy, unspecified trimester: Secondary | ICD-10-CM

## 2018-01-22 DIAGNOSIS — Z362 Encounter for other antenatal screening follow-up: Secondary | ICD-10-CM | POA: Insufficient documentation

## 2018-01-22 DIAGNOSIS — Z8759 Personal history of other complications of pregnancy, childbirth and the puerperium: Secondary | ICD-10-CM | POA: Diagnosis not present

## 2018-01-22 DIAGNOSIS — Z9889 Other specified postprocedural states: Secondary | ICD-10-CM

## 2018-01-23 DIAGNOSIS — Z0489 Encounter for examination and observation for other specified reasons: Secondary | ICD-10-CM | POA: Insufficient documentation

## 2018-01-23 DIAGNOSIS — Z3A24 24 weeks gestation of pregnancy: Secondary | ICD-10-CM | POA: Insufficient documentation

## 2018-01-23 DIAGNOSIS — IMO0002 Reserved for concepts with insufficient information to code with codable children: Secondary | ICD-10-CM | POA: Insufficient documentation

## 2018-02-13 ENCOUNTER — Ambulatory Visit (INDEPENDENT_AMBULATORY_CARE_PROVIDER_SITE_OTHER): Payer: BLUE CROSS/BLUE SHIELD | Admitting: Advanced Practice Midwife

## 2018-02-13 VITALS — BP 115/58 | HR 75 | Wt 216.0 lb

## 2018-02-13 DIAGNOSIS — Z3482 Encounter for supervision of other normal pregnancy, second trimester: Secondary | ICD-10-CM

## 2018-02-13 DIAGNOSIS — Z3480 Encounter for supervision of other normal pregnancy, unspecified trimester: Secondary | ICD-10-CM

## 2018-02-13 DIAGNOSIS — Z23 Encounter for immunization: Secondary | ICD-10-CM

## 2018-02-13 DIAGNOSIS — Z8759 Personal history of other complications of pregnancy, childbirth and the puerperium: Secondary | ICD-10-CM

## 2018-02-13 MED ORDER — TETANUS-DIPHTH-ACELL PERTUSSIS 5-2.5-18.5 LF-MCG/0.5 IM SUSP: 0.5000 mL | Freq: Once | INTRAMUSCULAR | Status: DC

## 2018-02-13 NOTE — Addendum Note (Signed)
Addended by: Anell BarrHOWARD, Lon Klippel L on: 02/13/2018 08:51 AM   Modules accepted: Orders

## 2018-02-13 NOTE — Patient Instructions (Addendum)
TDaP Vaccine Pregnancy Get the Whooping Cough Vaccine While You Are Pregnant (CDC)  It is important for women to get the whooping cough vaccine in the third trimester of each pregnancy. Vaccines are the best way to prevent this disease. There are 2 different whooping cough vaccines. Both vaccines combine protection against whooping cough, tetanus and diphtheria, but they are for different age groups: Tdap: for everyone 11 years or older, including pregnant women  DTaP: for children 2 months through 6 years of age  You need the whooping cough vaccine during each of your pregnancies The recommended time to get the shot is during your 27th through 36th week of pregnancy, preferably during the earlier part of this time period. The Centers for Disease Control and Prevention (CDC) recommends that pregnant women receive the whooping cough vaccine for adolescents and adults (called Tdap vaccine) during the third trimester of each pregnancy. The recommended time to get the shot is during your 27th through 36th week of pregnancy, preferably during the earlier part of this time period. This replaces the original recommendation that pregnant women get the vaccine only if they had not previously received it. The American College of Obstetricians and Gynecologists and the American College of Nurse-Midwives support this recommendation.  You should get the whooping cough vaccine while pregnant to pass protection to your baby frame support disabled and/or not supported in this browser  Learn why Gail Perez decided to get the whooping cough vaccine in her 3rd trimester of pregnancy and how her baby girl was born with some protection against the disease. Also available on YouTube. After receiving the whooping cough vaccine, your body will create protective antibodies (proteins produced by the body to fight off diseases) and pass some of them to your baby before birth. These antibodies provide your baby some short-term  protection against whooping cough in early life. These antibodies can also protect your baby from some of the more serious complications that come along with whooping cough. Your protective antibodies are at their highest about 2 weeks after getting the vaccine, but it takes time to pass them to your baby. So the preferred time to get the whooping cough vaccine is early in your third trimester. The amount of whooping cough antibodies in your body decreases over time. That is why CDC recommends you get a whooping cough vaccine during each pregnancy. Doing so allows each of your babies to get the greatest number of protective antibodies from you. This means each of your babies will get the best protection possible against this disease.  Getting the whooping cough vaccine while pregnant is better than getting the vaccine after you give birth Whooping cough vaccination during pregnancy is ideal so your baby will have short-term protection as soon as he is born. This early protection is important because your baby will not start getting his whooping cough vaccines until he is 2 months old. These first few months of life are when your baby is at greatest risk for catching whooping cough. This is also when he's at greatest risk for having severe, potentially life-threating complications from the infection. To avoid that gap in protection, it is best to get a whooping cough vaccine during pregnancy. You will then pass protection to your baby before he is born. To continue protecting your baby, he should get whooping cough vaccines starting at 2 months old. You may never have gotten the Tdap vaccine before and did not get it during this pregnancy. If so, you should make sure   to get the vaccine immediately after you give birth, before leaving the hospital or birthing center. It will take about 2 weeks before your body develops protection (antibodies) in response to the vaccine. Once you have protection from the vaccine,  you are less likely to give whooping cough to your newborn while caring for him. But remember, your baby will still be at risk for catching whooping cough from others. A recent study looked to see how effective Tdap was at preventing whooping cough in babies whose mothers got the vaccine while pregnant or in the hospital after giving birth. The study found that getting Tdap between 27 through 36 weeks of pregnancy is 85% more effective at preventing whooping cough in babies younger than 2 months old. Blood tests cannot tell if you need a whooping cough vaccine There are no blood tests that can tell you if you have enough antibodies in your body to protect yourself or your baby against whooping cough. Even if you have been sick with whooping cough in the past or previously received the vaccine, you still should get the vaccine during each pregnancy. Breastfeeding may pass some protective antibodies onto your baby By breastfeeding, you may pass some antibodies you have made in response to the vaccine to your baby. When you get a whooping cough vaccine during your pregnancy, you will have antibodies in your breast milk that you can share with your baby as soon as your milk comes in. However, your baby will not get protective antibodies immediately if you wait to get the whooping cough vaccine until after delivering your baby. This is because it takes about 2 weeks for your body to create antibodies. Learn more about the health benefits of breastfeeding.   Contraception Choices Contraception, also called birth control, refers to methods or devices that prevent pregnancy. Hormonal methods Contraceptive implant A contraceptive implant is a thin, plastic tube that contains a hormone. It is inserted into the upper part of the arm. It can remain in place for up to 3 years. Progestin-only injections Progestin-only injections are injections of progestin, a synthetic form of the hormone progesterone. They are  given every 3 months by a health care provider. Birth control pills Birth control pills are pills that contain hormones that prevent pregnancy. They must be taken once a day, preferably at the same time each day. Birth control patch The birth control patch contains hormones that prevent pregnancy. It is placed on the skin and must be changed once a week for three weeks and removed on the fourth week. A prescription is needed to use this method of contraception. Vaginal ring A vaginal ring contains hormones that prevent pregnancy. It is placed in the vagina for three weeks and removed on the fourth week. After that, the process is repeated with a new ring. A prescription is needed to use this method of contraception. Emergency contraceptive Emergency contraceptives prevent pregnancy after unprotected sex. They come in pill form and can be taken up to 5 days after sex. They work best the sooner they are taken after having sex. Most emergency contraceptives are available without a prescription. This method should not be used as your only form of birth control. Barrier methods Female condom A female condom is a thin sheath that is worn over the penis during sex. Condoms keep sperm from going inside a woman's body. They can be used with a spermicide to increase their effectiveness. They should be disposed after a single use. Female condom A female   condom is a soft, loose-fitting sheath that is put into the vagina before sex. The condom keeps sperm from going inside a woman's body. They should be disposed after a single use. Diaphragm A diaphragm is a soft, dome-shaped barrier. It is inserted into the vagina before sex, along with a spermicide. The diaphragm blocks sperm from entering the uterus, and the spermicide kills sperm. A diaphragm should be left in the vagina for 6-8 hours after sex and removed within 24 hours. A diaphragm is prescribed and fitted by a health care provider. A diaphragm should be  replaced every 1-2 years, after giving birth, after gaining more than 15 lb (6.8 kg), and after pelvic surgery. Cervical cap A cervical cap is a round, soft latex or plastic cup that fits over the cervix. It is inserted into the vagina before sex, along with spermicide. It blocks sperm from entering the uterus. The cap should be left in place for 6-8 hours after sex and removed within 48 hours. A cervical cap must be prescribed and fitted by a health care provider. It should be replaced every 2 years. Sponge A sponge is a soft, circular piece of polyurethane foam with spermicide on it. The sponge helps block sperm from entering the uterus, and the spermicide kills sperm. To use it, you make it wet and then insert it into the vagina. It should be inserted before sex, left in for at least 6 hours after sex, and removed and thrown away within 30 hours. Spermicides Spermicides are chemicals that kill or block sperm from entering the cervix and uterus. They can come as a cream, jelly, suppository, foam, or tablet. A spermicide should be inserted into the vagina with an applicator at least 10-15 minutes before sex to allow time for it to work. The process must be repeated every time you have sex. Spermicides do not require a prescription. Intrauterine contraception Intrauterine device (IUD) An IUD is a T-shaped device that is put in a woman's uterus. There are two types:  Hormone IUD.This type contains progestin, a synthetic form of the hormone progesterone. This type can stay in place for 3-5 years.  Copper IUD.This type is wrapped in copper wire. It can stay in place for 10 years.  Permanent methods of contraception Female tubal ligation In this method, a woman's fallopian tubes are sealed, tied, or blocked during surgery to prevent eggs from traveling to the uterus. Hysteroscopic sterilization In this method, a small, flexible insert is placed into each fallopian tube. The inserts cause scar tissue  to form in the fallopian tubes and block them, so sperm cannot reach an egg. The procedure takes about 3 months to be effective. Another form of birth control must be used during those 3 months. Female sterilization This is a procedure to tie off the tubes that carry sperm (vasectomy). After the procedure, the man can still ejaculate fluid (semen). Natural planning methods Natural family planning In this method, a couple does not have sex on days when the woman could become pregnant. Calendar method This means keeping track of the length of each menstrual cycle, identifying the days when pregnancy can happen, and not having sex on those days. Ovulation method In this method, a couple avoids sex during ovulation. Symptothermal method This method involves not having sex during ovulation. The woman typically checks for ovulation by watching changes in her temperature and in the consistency of cervical mucus. Post-ovulation method In this method, a couple waits to have sex until after ovulation.   Summary  Contraception, also called birth control, means methods or devices that prevent pregnancy.  Hormonal methods of contraception include implants, injections, pills, patches, vaginal rings, and emergency contraceptives.  Barrier methods of contraception can include female condoms, female condoms, diaphragms, cervical caps, sponges, and spermicides.  There are two types of IUDs (intrauterine devices). An IUD can be put in a woman's uterus to prevent pregnancy for 3-5 years.  Permanent sterilization can be done through a procedure for males, females, or both.  Natural family planning methods involve not having sex on days when the woman could become pregnant. This information is not intended to replace advice given to you by your health care provider. Make sure you discuss any questions you have with your health care provider. Document Released: 08/08/2005 Document Revised: 09/10/2016 Document  Reviewed: 09/10/2016 Elsevier Interactive Patient Education  2018 [employer]   

## 2018-02-13 NOTE — Progress Notes (Signed)
   PRENATAL VISIT NOTE  Subjective:  Carney Bernyisha Coates is a 27 y.o. G3P2002 at 5355w1d being seen today for ongoing prenatal care.  She is currently monitored for the following issues for this low-risk pregnancy and has History of pregnancy induced hypertension; H/O LEEP; Supervision of other normal pregnancy, antepartum; Evaluate anatomy not seen on prior sonogram; and [redacted] weeks gestation of pregnancy on their problem list.  Patient reports no complaints.  Contractions: Not present. Vag. Bleeding: None.  Movement: Present. Denies leaking of fluid.   The following portions of the patient's history were reviewed and updated as appropriate: allergies, current medications, past family history, past medical history, past social history, past surgical history and problem list. Problem list updated.  Objective:   Vitals:   02/13/18 0811  BP: (!) 115/58  Pulse: 75  Weight: 216 lb (98 kg)    Fetal Status: Fetal Heart Rate (bpm): 136 Fundal Height: 28 cm Movement: Present  Presentation: Vertex  General:  Alert, oriented and cooperative. Patient is in no acute distress.  Skin: Skin is warm and dry. No rash noted.   Cardiovascular: Normal heart rate noted  Respiratory: Normal respiratory effort, no problems with respiration noted  Abdomen: Soft, gravid, appropriate for gestational age.  Pain/Pressure: Present     Pelvic: Cervical exam deferred        Extremities: Normal range of motion.  Edema: Trace  Mental Status: Normal mood and affect. Normal behavior. Normal judgment and thought content.   Assessment and Plan:  Pregnancy: G3P2002 at 5755w1d  1. Supervision of other normal pregnancy, antepartum  - Glucose Tolerance, 2 Hours w/1 Hour - HIV antibody (with reflex) - RPR - CBC - Tdap (BOOSTRIX) injection 0.5 mL  2. History of gestational hypertension  - Protein / creatinine ratio, urine  Preterm labor symptoms and general obstetric precautions including but not limited to vaginal bleeding,  contractions, leaking of fluid and fetal movement were reviewed in detail with the patient. Please refer to After Visit Summary for other counseling recommendations.  Return in about 2 weeks (around 02/27/2018) for ROB.  No future appointments.  Dorathy KinsmanVirginia Jasmin Trumbull, CNM

## 2018-02-14 ENCOUNTER — Encounter: Payer: Self-pay | Admitting: Family Medicine

## 2018-02-14 LAB — HIV ANTIBODY (ROUTINE TESTING W REFLEX): HIV SCREEN 4TH GENERATION: NONREACTIVE

## 2018-02-14 LAB — CBC
Hematocrit: 34.1 % (ref 34.0–46.6)
Hemoglobin: 11.3 g/dL (ref 11.1–15.9)
MCH: 26.8 pg (ref 26.6–33.0)
MCHC: 33.1 g/dL (ref 31.5–35.7)
MCV: 81 fL (ref 79–97)
PLATELETS: 290 10*3/uL (ref 150–450)
RBC: 4.22 x10E6/uL (ref 3.77–5.28)
RDW: 14.8 % (ref 12.3–15.4)
WBC: 8.5 10*3/uL (ref 3.4–10.8)

## 2018-02-14 LAB — PROTEIN / CREATININE RATIO, URINE
CREATININE, UR: 64.3 mg/dL
PROTEIN/CREAT RATIO: 236 mg/g{creat} — AB (ref 0–200)
Protein, Ur: 15.2 mg/dL

## 2018-02-14 LAB — GLUCOSE TOLERANCE, 2 HOURS W/ 1HR
Glucose, 1 hour: 77 mg/dL (ref 65–179)
Glucose, 2 hour: 66 mg/dL (ref 65–152)
Glucose, Fasting: 69 mg/dL (ref 65–91)

## 2018-02-14 LAB — RPR: RPR: NONREACTIVE

## 2018-03-02 ENCOUNTER — Ambulatory Visit (INDEPENDENT_AMBULATORY_CARE_PROVIDER_SITE_OTHER): Payer: BLUE CROSS/BLUE SHIELD | Admitting: Family Medicine

## 2018-03-02 VITALS — BP 125/64 | HR 85 | Wt 218.1 lb

## 2018-03-02 DIAGNOSIS — Z3483 Encounter for supervision of other normal pregnancy, third trimester: Secondary | ICD-10-CM

## 2018-03-02 DIAGNOSIS — Z9889 Other specified postprocedural states: Secondary | ICD-10-CM

## 2018-03-02 DIAGNOSIS — Z8759 Personal history of other complications of pregnancy, childbirth and the puerperium: Secondary | ICD-10-CM

## 2018-03-02 DIAGNOSIS — Z3480 Encounter for supervision of other normal pregnancy, unspecified trimester: Secondary | ICD-10-CM

## 2018-03-02 NOTE — Progress Notes (Signed)
   PRENATAL VISIT NOTE  Subjective:  Carney Bernyisha Boydstun is a 27 y.o. G3P2002 at 962w4d being seen today for ongoing prenatal care.  She is currently monitored for the following issues for this low-risk pregnancy and has History of pregnancy induced hypertension; H/O LEEP; and Supervision of other normal pregnancy, antepartum on their problem list.  Patient reports no complaints.  Contractions: Irritability. Vag. Bleeding: None.  Movement: Present. Denies leaking of fluid.   The following portions of the patient's history were reviewed and updated as appropriate: allergies, current medications, past family history, past medical history, past social history, past surgical history and problem list. Problem list updated.  Objective:   Vitals:   03/02/18 0806 03/02/18 0811  BP: (!) 112/45 125/64  Pulse: 81 85  Weight: 218 lb 1.3 oz (98.9 kg)     Fetal Status: Fetal Heart Rate (bpm): 140   Movement: Present     General:  Alert, oriented and cooperative. Patient is in no acute distress.  Skin: Skin is warm and dry. No rash noted.   Cardiovascular: Normal heart rate noted  Respiratory: Normal respiratory effort, no problems with respiration noted  Abdomen: Soft, gravid, appropriate for gestational age.  Pain/Pressure: Present     Pelvic: Cervical exam deferred        Extremities: Normal range of motion.  Edema: Trace  Mental Status: Normal mood and affect. Normal behavior. Normal judgment and thought content.   Assessment and Plan:  Pregnancy: G3P2002 at 1162w4d  1. Supervision of other normal pregnancy, antepartum FHT and FH normal  2. History of gestational hypertension Taking ASA 81mg . BP normal  3. H/O LEEP   Preterm labor symptoms and general obstetric precautions including but not limited to vaginal bleeding, contractions, leaking of fluid and fetal movement were reviewed in detail with the patient. Please refer to After Visit Summary for other counseling recommendations.  Return in  about 2 weeks (around 03/16/2018) for OB f/u.  No future appointments.  Levie HeritageJacob J Markesha Hannig, DO

## 2018-03-08 ENCOUNTER — Encounter: Payer: Self-pay | Admitting: Family Medicine

## 2018-03-16 ENCOUNTER — Encounter: Payer: Self-pay | Admitting: Obstetrics and Gynecology

## 2018-03-16 ENCOUNTER — Ambulatory Visit (INDEPENDENT_AMBULATORY_CARE_PROVIDER_SITE_OTHER): Payer: BLUE CROSS/BLUE SHIELD | Admitting: Obstetrics and Gynecology

## 2018-03-16 VITALS — BP 119/67 | HR 86 | Wt 219.0 lb

## 2018-03-16 DIAGNOSIS — Z9889 Other specified postprocedural states: Secondary | ICD-10-CM

## 2018-03-16 DIAGNOSIS — Z3480 Encounter for supervision of other normal pregnancy, unspecified trimester: Secondary | ICD-10-CM

## 2018-03-16 DIAGNOSIS — Z8759 Personal history of other complications of pregnancy, childbirth and the puerperium: Secondary | ICD-10-CM

## 2018-03-16 DIAGNOSIS — Z3483 Encounter for supervision of other normal pregnancy, third trimester: Secondary | ICD-10-CM

## 2018-03-16 NOTE — Progress Notes (Signed)
   PRENATAL VISIT NOTE  Subjective:  Gail Perez is a 27 y.o. G3P2002 at 3646w4d being seen today for ongoing prenatal care.  She is currently monitored for the following issues for this low-risk pregnancy and has History of pregnancy induced hypertension; H/O LEEP; and Supervision of other normal pregnancy, antepartum on their problem list.  Patient reports occasional contractions, lasted for ~25 min the other day but subsided.  Contractions: Not present. Vag. Bleeding: None.  Movement: Present. Denies leaking of fluid.   The following portions of the patient's history were reviewed and updated as appropriate: allergies, current medications, past family history, past medical history, past social history, past surgical history and problem list. Problem list updated.  Objective:   Vitals:   03/16/18 0917  BP: 119/67  Pulse: 86  Weight: 219 lb (99.3 kg)    Fetal Status: Fetal Heart Rate (bpm): 135 Fundal Height: 31 cm Movement: Present     General:  Alert, oriented and cooperative. Patient is in no acute distress.  Skin: Skin is warm and dry. No rash noted.   Cardiovascular: Normal heart rate noted  Respiratory: Normal respiratory effort, no problems with respiration noted  Abdomen: Soft, gravid, appropriate for gestational age.  Pain/Pressure: Present     Pelvic: Cervical exam deferred        Extremities: Normal range of motion.  Edema: Trace  Mental Status: Normal mood and affect. Normal behavior. Normal judgment and thought content.   Assessment and Plan:  Pregnancy: G3P2002 at 3146w4d  1. Supervision of other normal pregnancy, antepartum Discussed IUD pp  2. History of pregnancy induced hypertension BP normal today  3. H/O LEEP   Preterm labor symptoms and general obstetric precautions including but not limited to vaginal bleeding, contractions, leaking of fluid and fetal movement were reviewed in detail with the patient. Please refer to After Visit Summary for other  counseling recommendations.  Return in about 2 weeks (around 03/30/2018) for OB visit.  No future appointments.  Conan BowensKelly M Amari Zagal, MD

## 2018-03-19 ENCOUNTER — Encounter: Payer: Self-pay | Admitting: Family Medicine

## 2018-03-20 ENCOUNTER — Other Ambulatory Visit: Payer: Self-pay

## 2018-03-20 MED ORDER — BREAST PUMP MISC
1.0000 | 0 refills | Status: DC | PRN
Start: 2018-03-20 — End: 2018-05-16

## 2018-03-20 NOTE — Progress Notes (Signed)
Pt walked into the office to pick up a prescription for a breast pump to send to her insurance company. Prescription was picked up on 03/20/18 at 3:58pm.

## 2018-03-29 ENCOUNTER — Encounter: Payer: Self-pay | Admitting: Family Medicine

## 2018-03-29 ENCOUNTER — Ambulatory Visit (INDEPENDENT_AMBULATORY_CARE_PROVIDER_SITE_OTHER): Payer: BLUE CROSS/BLUE SHIELD | Admitting: Family Medicine

## 2018-03-29 VITALS — BP 116/60 | HR 85 | Wt 224.0 lb

## 2018-03-29 DIAGNOSIS — Z3480 Encounter for supervision of other normal pregnancy, unspecified trimester: Secondary | ICD-10-CM

## 2018-03-29 DIAGNOSIS — Z8759 Personal history of other complications of pregnancy, childbirth and the puerperium: Secondary | ICD-10-CM

## 2018-03-29 DIAGNOSIS — Z9889 Other specified postprocedural states: Secondary | ICD-10-CM

## 2018-03-29 DIAGNOSIS — Z3490 Encounter for supervision of normal pregnancy, unspecified, unspecified trimester: Secondary | ICD-10-CM

## 2018-03-29 NOTE — Progress Notes (Signed)
   PRENATAL VISIT NOTE  Subjective:  Gail Perez is a 27 y.o. G3P2002 at 7235w3d being seen today for ongoing prenatal care.  She is currently monitored for the following issues for this low-risk pregnancy and has History of pregnancy induced hypertension; H/O LEEP; and Supervision of other normal pregnancy, antepartum on their problem list.  Patient reports no complaints.  Contractions: Not present. Vag. Bleeding: None.  Movement: Present. Denies leaking of fluid.   The following portions of the patient's history were reviewed and updated as appropriate: allergies, current medications, past family history, past medical history, past social history, past surgical history and problem list. Problem list updated.  Objective:   Vitals:   03/29/18 1005  BP: 116/60  Pulse: 85  Weight: 224 lb (101.6 kg)    Fetal Status:   Fundal Height: 32 cm Movement: Present     General:  Alert, oriented and cooperative. Patient is in no acute distress.  Skin: Skin is warm and dry. No rash noted.   Cardiovascular: Normal heart rate noted  Respiratory: Normal respiratory effort, no problems with respiration noted  Abdomen: Soft, gravid, appropriate for gestational age.  Pain/Pressure: Present     Pelvic: Cervical exam deferred        Extremities: Normal range of motion.  Edema: Trace  Mental Status: Normal mood and affect. Normal behavior. Normal judgment and thought content.   Assessment and Plan:  Pregnancy: G3P2002 at 2735w3d  1. Supervision of other normal pregnancy, antepartum FHT and FH normal  2. Prenatal care, antepartum  - POC Urinalysis Dipstick OB  3. H/O LEEP   4. History of gestational hypertension BP controlled. Taking ASA 81mg .  Preterm labor symptoms and general obstetric precautions including but not limited to vaginal bleeding, contractions, leaking of fluid and fetal movement were reviewed in detail with the patient. Please refer to After Visit Summary for other counseling  recommendations.  Return in about 2 weeks (around 04/12/2018).  No future appointments.  Gail HeritageJacob J Maleeha Halls, DO

## 2018-04-10 ENCOUNTER — Encounter (HOSPITAL_COMMUNITY): Payer: Self-pay

## 2018-04-10 ENCOUNTER — Inpatient Hospital Stay (HOSPITAL_COMMUNITY)
Admission: AD | Admit: 2018-04-10 | Discharge: 2018-04-10 | Disposition: A | Discharge status: Home / Self Care | Payer: BLUE CROSS/BLUE SHIELD | Source: Ambulatory Visit | Attending: Obstetrics and Gynecology | Admitting: Obstetrics and Gynecology

## 2018-04-10 DIAGNOSIS — Z7982 Long term (current) use of aspirin: Secondary | ICD-10-CM | POA: Diagnosis not present

## 2018-04-10 DIAGNOSIS — O4703 False labor before 37 completed weeks of gestation, third trimester: Secondary | ICD-10-CM

## 2018-04-10 DIAGNOSIS — Z3A35 35 weeks gestation of pregnancy: Secondary | ICD-10-CM | POA: Diagnosis not present

## 2018-04-10 LAB — URINALYSIS, ROUTINE W REFLEX MICROSCOPIC
Bilirubin Urine: NEGATIVE
Glucose, UA: NEGATIVE mg/dL
Hgb urine dipstick: NEGATIVE
KETONES UR: NEGATIVE mg/dL
LEUKOCYTES UA: NEGATIVE
NITRITE: NEGATIVE
PROTEIN: NEGATIVE mg/dL
Specific Gravity, Urine: 1.019 (ref 1.005–1.030)
pH: 6 (ref 5.0–8.0)

## 2018-04-10 NOTE — MAU Provider Note (Signed)
History     CSN: [id number]  Arrival date and time: 04/10/18 1527   First Provider Initiated Contact with Patient 04/10/18 1618      Chief Complaint  Patient presents with  . Contractions   HPI Gail Perez is a 27 y.o. G3P2002 at 10459w1d who presents with contractions. She states they started last night and got gradually worse this afternoon. She states the contractions are every 2-3 minutes and she rates them a 6/10. She denies any leaking or bleeding. Reports good fetal movement. Denies any problems during this pregnancy.   OB History    Gravida  3   Para  2   Term  2   Preterm  0   AB  0   Living  2     SAB  0   TAB  0   Ectopic  0   Multiple  0   Live Births  2           Past Medical History:  Diagnosis Date  . Allergy   . Chlamydia   . Hx of varicella   . Pregnancy induced hypertension 2014  . Seasonal allergies   . Urinary tract infection   . Vaginal Pap smear, abnormal     Past Surgical History:  Procedure Laterality Date  . LEEP      Family History  Problem Relation Age of Onset  . Hypertension Father   . Hyperlipidemia Father   . Heart disease Father        enlatged heart  . Sarcoidosis Father   . Anemia Mother   . Cancer Maternal Grandfather        lung  . Hypertension Sister   . Other Neg Hx     Social History   Tobacco Use  . Smoking status: Never Smoker  . Smokeless tobacco: Never Used  Substance Use Topics  . Alcohol use: No    Comment: occasional  . Drug use: No    Allergies: No Known Allergies  Medications Prior to Admission  Medication Sig Dispense Refill Last Dose  . acetaminophen (TYLENOL) 500 MG tablet Take 500 mg by mouth every 6 (six) hours as needed for mild pain or headache.   Taking  . aspirin EC 81 MG tablet Take 1 tablet (81 mg total) by mouth daily. Take after 12 weeks for prevention of preeclampsia later in pregnancy 300 tablet 2 Taking  . calcium carbonate (TUMS - DOSED IN MG ELEMENTAL CALCIUM)  500 MG chewable tablet Chew 1 tablet by mouth 2 (two) times daily as needed for indigestion or heartburn.   Taking  . Misc. Devices (BREAST PUMP) MISC 1 Device by Does not apply route as needed. 1 each 0 Taking  . Prenat w/o A-FE-Methfol-FA-DHA (PNV-DHA) 27-0.6-0.4-300 MG CAPS Take 1 tablet by mouth daily. 30 capsule 6 Taking    Review of Systems  Constitutional: Negative.  Negative for fatigue and fever.  HENT: Negative.   Respiratory: Negative.  Negative for shortness of breath.   Cardiovascular: Negative.  Negative for chest pain.  Gastrointestinal: Positive for abdominal pain. Negative for constipation, diarrhea, nausea and vomiting.  Genitourinary: Negative.  Negative for dysuria, vaginal bleeding and vaginal discharge.  Neurological: Negative.  Negative for dizziness and headaches.   Physical Exam   Blood pressure 130/73, pulse 81, temperature 98.1 F (36.7 C), temperature source Oral, resp. rate 17, height 5\' 9"  (1.753 m), weight 101.6 kg, last menstrual period 08/07/2017, SpO2 100 %.  Physical Exam  Nursing note  and vitals reviewed. Constitutional: She is oriented to person, place, and time. She appears well-developed and well-nourished. No distress.  HENT:  Head: Normocephalic.  Eyes: Pupils are equal, round, and reactive to light.  Cardiovascular: Normal rate, regular rhythm and normal heart sounds.  Respiratory: Effort normal and breath sounds normal. No respiratory distress.  GI: Soft. Bowel sounds are normal. She exhibits no distension. There is no tenderness.  Neurological: She is alert and oriented to person, place, and time.  Skin: Skin is warm and dry.  Psychiatric: She has a normal mood and affect. Her behavior is normal. Judgment and thought content normal.   Fetal Tracing:  Baseline: 125 Variability: moderate Accels: 15x15 Decels: none  Toco: occasional uc's  Dilation: 1 Effacement (%): 80 Station: -3 Exam by:: Antony Odeaaroline Neil, CNM  MAU Course   Procedures Results for orders placed or performed during the hospital encounter of 04/10/18 (from the past 24 hour(s))  Urinalysis, Routine w reflex microscopic     Status: Abnormal   Collection Time: 04/10/18  4:15 PM  Result Value Ref Range   Color, Urine YELLOW YELLOW   APPearance HAZY (A) CLEAR   Specific Gravity, Urine 1.019 1.005 - 1.030   pH 6.0 5.0 - 8.0   Glucose, UA NEGATIVE NEGATIVE mg/dL   Hgb urine dipstick NEGATIVE NEGATIVE   Bilirubin Urine NEGATIVE NEGATIVE   Ketones, ur NEGATIVE NEGATIVE mg/dL   Protein, ur NEGATIVE NEGATIVE mg/dL   Nitrite NEGATIVE NEGATIVE   Leukocytes, UA NEGATIVE NEGATIVE   MDM Prenatal records from private office reviewed. Labs ordered and reviewed.  UA  No change in cervical exam in 1.5 hours  Assessment and Plan   1. Threatened premature labor in third trimester   2. [redacted] weeks gestation of pregnancy    -Discharge home in stable condition -Preterm labor precautions discussed -Patient advised to follow-up with [facility] as scheduled for prenatal care -Patient may return to MAU as needed or if her condition were to change or worsen   Rolm BookbinderCaroline M Neill CNM 04/10/2018, 4:18 PM

## 2018-04-10 NOTE — Discharge Instructions (Signed)
Braxton Hicks Contractions °Contractions of the uterus can occur throughout pregnancy, but they are not always a sign that you are in labor. You may have practice contractions called Braxton Hicks contractions. These false labor contractions are sometimes confused with true labor. °What are Braxton Hicks contractions? °Braxton Hicks contractions are tightening movements that occur in the muscles of the uterus before labor. Unlike true labor contractions, these contractions do not result in opening (dilation) and thinning of the cervix. Toward the end of pregnancy (32-34 weeks), Braxton Hicks contractions can happen more often and may become stronger. These contractions are sometimes difficult to tell apart from true labor because they can be very uncomfortable. You should not feel embarrassed if you go to the hospital with false labor. °Sometimes, the only way to tell if you are in true labor is for your health care provider to look for changes in the cervix. The health care provider will do a physical exam and may monitor your contractions. If you are not in true labor, the exam should show that your cervix is not dilating and your water has not broken. °If there are other health problems associated with your pregnancy, it is completely safe for you to be sent home with false labor. You may continue to have Braxton Hicks contractions until you go into true labor. °How to tell the difference between true labor and false labor °True labor °· Contractions last 30-70 seconds. °· Contractions become very regular. °· Discomfort is usually felt in the top of the uterus, and it spreads to the lower abdomen and low back. °· Contractions do not go away with walking. °· Contractions usually become more intense and increase in frequency. °· The cervix dilates and gets thinner. °False labor °· Contractions are usually shorter and not as strong as true labor contractions. °· Contractions are usually irregular. °· Contractions  are often felt in the front of the lower abdomen and in the groin. °· Contractions may go away when you walk around or change positions while lying down. °· Contractions get weaker and are shorter-lasting as time goes on. °· The cervix usually does not dilate or become thin. °Follow these instructions at home: °· Take over-the-counter and prescription medicines only as told by your health care provider. °· Keep up with your usual exercises and follow other instructions from your health care provider. °· Eat and drink lightly if you think you are going into labor. °· If Braxton Hicks contractions are making you uncomfortable: °? Change your position from lying down or resting to walking, or change from walking to resting. °? Sit and rest in a tub of warm water. °? Drink enough fluid to keep your urine pale yellow. Dehydration may cause these contractions. °? Do slow and deep breathing several times an hour. °· Keep all follow-up prenatal visits as told by your health care provider. This is important. °Contact a health care provider if: °· You have a fever. °· You have continuous pain in your abdomen. °Get help right away if: °· Your contractions become stronger, more regular, and closer together. °· You have fluid leaking or gushing from your vagina. °· You pass blood-tinged mucus (bloody show). °· You have bleeding from your vagina. °· You have low back pain that you never had before. °· You feel your baby’s head pushing down and causing pelvic pressure. °· Your baby is not moving inside you as much as it used to. °Summary °· Contractions that occur before labor are called Braxton   Hicks contractions, false labor, or practice contractions. °· Braxton Hicks contractions are usually shorter, weaker, farther apart, and less regular than true labor contractions. True labor contractions usually become progressively stronger and regular and they become more frequent. °· Manage discomfort from Braxton Hicks contractions by  changing position, resting in a warm bath, drinking plenty of water, or practicing deep breathing. °This information is not intended to replace advice given to you by your health care provider. Make sure you discuss any questions you have with your health care provider. °Document Released: 12/22/2016 Document Revised: 12/22/2016 Document Reviewed: 12/22/2016 °Elsevier Interactive Patient Education © 2018 [employer] ° °Safe Medications in Pregnancy  ° °Acne: °Benzoyl Peroxide °Salicylic Acid ° °Backache/Headache: °Tylenol: 2 regular strength every 4 hours OR °             2 Extra strength every 6 hours ° °Colds/Coughs/Allergies: °Benadryl (alcohol free) 25 mg every 6 hours as needed °Breath right strips °Claritin °Cepacol throat lozenges °Chloraseptic throat spray °Cold-Eeze- up to three times per day °Cough drops, alcohol free °Flonase (by prescription only) °Guaifenesin °Mucinex °Robitussin DM (plain only, alcohol free) °Saline nasal spray/drops °Sudafed (pseudoephedrine) & Actifed ** use only after [redacted] weeks gestation and if you do not have high blood pressure °Tylenol °Vicks Vaporub °Zinc lozenges °Zyrtec  ° °Constipation: °Colace °Ducolax suppositories °Fleet enema °Glycerin suppositories °Metamucil °Milk of magnesia °Miralax °Senokot °Smooth move tea ° °Diarrhea: °Kaopectate °Imodium A-D ° °*NO pepto Bismol ° °Hemorrhoids: °Anusol °Anusol HC °Preparation H °Tucks ° °Indigestion: °Tums °Maalox °Mylanta °Zantac  °Pepcid ° °Insomnia: °Benadryl (alcohol free) 25mg every 6 hours as needed °Tylenol PM °Unisom, no Gelcaps ° °Leg Cramps: °Tums °MagGel ° °Nausea/Vomiting:  °Bonine °Dramamine °Emetrol °Ginger extract °Sea bands °Meclizine  °Nausea medication to take during pregnancy:  °Unisom (doxylamine succinate 25 mg tablets) Take one tablet daily at bedtime. If symptoms are not adequately controlled, the dose can be increased to a maximum recommended dose of two tablets daily (1/2 tablet in the morning, 1/2 tablet  mid-afternoon and one at bedtime). °Vitamin B6 100mg tablets. Take one tablet twice a day (up to 200 mg per day). ° °Skin Rashes: °Aveeno products °Benadryl cream or 25mg every 6 hours as needed °Calamine Lotion °1% cortisone cream ° °Yeast infection: °Gyne-lotrimin 7 °Monistat 7 ° ° °**If taking multiple medications, please check labels to avoid duplicating the same active ingredients °**take medication as directed on the label °** Do not exceed 4000 mg of tylenol in 24 hours °**Do not take medications that contain aspirin or ibuprofen ° ° ° ° °

## 2018-04-10 NOTE — MAU Note (Signed)
Pt reports she called the office today because she is having more contractions, back pain, diarrhea and they told her to come in to be evaluated for preterm labor.

## 2018-04-13 ENCOUNTER — Encounter: Payer: BLUE CROSS/BLUE SHIELD | Admitting: Obstetrics & Gynecology

## 2018-04-13 ENCOUNTER — Ambulatory Visit (INDEPENDENT_AMBULATORY_CARE_PROVIDER_SITE_OTHER): Payer: BLUE CROSS/BLUE SHIELD | Admitting: Obstetrics & Gynecology

## 2018-04-13 ENCOUNTER — Encounter: Payer: Self-pay | Admitting: Obstetrics & Gynecology

## 2018-04-13 VITALS — BP 128/65 | HR 75 | Wt 225.0 lb

## 2018-04-13 DIAGNOSIS — Z9889 Other specified postprocedural states: Secondary | ICD-10-CM

## 2018-04-13 DIAGNOSIS — Z3480 Encounter for supervision of other normal pregnancy, unspecified trimester: Secondary | ICD-10-CM

## 2018-04-13 DIAGNOSIS — Z8759 Personal history of other complications of pregnancy, childbirth and the puerperium: Secondary | ICD-10-CM

## 2018-04-13 NOTE — Patient Instructions (Signed)

## 2018-04-13 NOTE — Progress Notes (Signed)
   PRENATAL VISIT NOTE  Subjective:  Gail Perez is a 27 y.o. G3P2002 at 2163w4d being seen today for ongoing prenatal care.  She is currently monitored for the following issues for this low-risk pregnancy and has History of pregnancy induced hypertension; H/O LEEP; and Supervision of other normal pregnancy, antepartum on their problem list.  Patient reports no complaints.  Contractions: Not present. Vag. Bleeding: None.  Movement: Present. Denies leaking of fluid.   The following portions of the patient's history were reviewed and updated as appropriate: allergies, current medications, past family history, past medical history, past social history, past surgical history and problem list. Problem list updated.  Objective:   Vitals:   04/13/18 0802  BP: 128/65  Pulse: 75  Weight: 225 lb (102.1 kg)    Fetal Status: Fetal Heart Rate (bpm): 130   Movement: Present     General:  Alert, oriented and cooperative. Patient is in no acute distress.  Skin: Skin is warm and dry. No rash noted.   Cardiovascular: Normal heart rate noted  Respiratory: Normal respiratory effort, no problems with respiration noted  Abdomen: Soft, gravid, appropriate for gestational age.  Pain/Pressure: Present     Pelvic: Cervical exam deferred        Extremities: Normal range of motion.  Edema: Trace  Mental Status: Normal mood and affect. Normal behavior. Normal judgment and thought content.   Assessment and Plan:  Pregnancy: G3P2002 at 2763w4d  1. Supervision of other normal pregnancy, antepartum Reviewed pain meds in labor   2. History of pregnancy induced hypertension rec stopping baby ASA next week  3. H/O LEEP  Preterm labor symptoms and general obstetric precautions including but not limited to vaginal bleeding, contractions, leaking of fluid and fetal movement were reviewed in detail with the patient. Please refer to After Visit Summary for other counseling recommendations.  Return in about 2 weeks  (around 04/27/2018).  Future Appointments  Date Time Provider Department Center  04/13/2018  8:15 AM Willodean RosenthalHarraway-Smith, Vanesha [place], MD [facility] None    Willodean Rosenthalarolyn Harraway-Smith, MD

## 2018-04-15 ENCOUNTER — Other Ambulatory Visit: Payer: Self-pay

## 2018-04-15 ENCOUNTER — Encounter (HOSPITAL_BASED_OUTPATIENT_CLINIC_OR_DEPARTMENT_OTHER): Payer: Self-pay | Admitting: *Deleted

## 2018-04-15 ENCOUNTER — Emergency Department (HOSPITAL_BASED_OUTPATIENT_CLINIC_OR_DEPARTMENT_OTHER)
Admission: EM | Admit: 2018-04-15 | Discharge: 2018-04-15 | Disposition: A | Discharge status: Home / Self Care | Payer: BLUE CROSS/BLUE SHIELD | Attending: Emergency Medicine | Admitting: Emergency Medicine

## 2018-04-15 DIAGNOSIS — O9989 Other specified diseases and conditions complicating pregnancy, childbirth and the puerperium: Secondary | ICD-10-CM | POA: Diagnosis present

## 2018-04-15 DIAGNOSIS — Z79899 Other long term (current) drug therapy: Secondary | ICD-10-CM | POA: Diagnosis not present

## 2018-04-15 DIAGNOSIS — O99513 Diseases of the respiratory system complicating pregnancy, third trimester: Secondary | ICD-10-CM | POA: Insufficient documentation

## 2018-04-15 DIAGNOSIS — Z3A35 35 weeks gestation of pregnancy: Secondary | ICD-10-CM | POA: Insufficient documentation

## 2018-04-15 DIAGNOSIS — Z3480 Encounter for supervision of other normal pregnancy, unspecified trimester: Secondary | ICD-10-CM

## 2018-04-15 DIAGNOSIS — J4521 Mild intermittent asthma with (acute) exacerbation: Secondary | ICD-10-CM

## 2018-04-15 DIAGNOSIS — Z8759 Personal history of other complications of pregnancy, childbirth and the puerperium: Secondary | ICD-10-CM

## 2018-04-15 MED ORDER — PREDNISONE 20 MG PO TABS: 40.0000 mg | ORAL_TABLET | Freq: Every day | ORAL | 0 refills | Status: AC

## 2018-04-15 MED ORDER — AEROCHAMBER PLUS FLO-VU MEDIUM MISC
1.0000 | Freq: Once | Status: AC
  Administered 2018-04-15: 1
  Filled 2018-04-15: qty 1

## 2018-04-15 MED ORDER — ALBUTEROL SULFATE HFA 108 (90 BASE) MCG/ACT IN AERS
2.0000 | INHALATION_SPRAY | Freq: Once | RESPIRATORY_TRACT | Status: AC
  Administered 2018-04-15: 2 via RESPIRATORY_TRACT
  Filled 2018-04-15: qty 6.7

## 2018-04-15 MED ORDER — ALBUTEROL SULFATE (2.5 MG/3ML) 0.083% IN NEBU
5.0000 mg | INHALATION_SOLUTION | Freq: Once | RESPIRATORY_TRACT | Status: AC
  Administered 2018-04-15: 5 mg via RESPIRATORY_TRACT
  Filled 2018-04-15: qty 6

## 2018-04-15 MED ORDER — LACTATED RINGERS IV BOLUS
1000.0000 mL | Freq: Once | INTRAVENOUS | Status: AC
  Administered 2018-04-15: 1000 mL via INTRAVENOUS

## 2018-04-15 MED ORDER — MAGNESIUM SULFATE 2 GM/50ML IV SOLN
2.0000 g | Freq: Once | INTRAVENOUS | Status: AC
  Administered 2018-04-15: 2 g via INTRAVENOUS
  Filled 2018-04-15: qty 50

## 2018-04-15 MED ORDER — METHYLPREDNISOLONE SODIUM SUCC 125 MG IJ SOLR
125.0000 mg | Freq: Once | INTRAMUSCULAR | Status: AC
  Administered 2018-04-15: 125 mg via INTRAVENOUS
  Filled 2018-04-15: qty 2

## 2018-04-15 MED ORDER — ALBUTEROL SULFATE (2.5 MG/3ML) 0.083% IN NEBU
5.0000 mg | INHALATION_SOLUTION | Freq: Once | RESPIRATORY_TRACT | Status: AC
Start: 2018-04-15 — End: 2018-04-15
  Administered 2018-04-15: 5 mg via RESPIRATORY_TRACT
  Filled 2018-04-15: qty 6

## 2018-04-15 NOTE — Progress Notes (Signed)
Spoke with OB, Dr. Vergie LivingPickens. NST reactive, Cat I strip.  Patient OB cleared.

## 2018-04-15 NOTE — Progress Notes (Signed)
Patient ambulated around the department while on pulse ox.  Patient's SPO2 remained at 100% and her heart rate was between 120 and 130.  Patient stated that she was breathing better.

## 2018-04-15 NOTE — ED Provider Notes (Signed)
[facility] EMERGENCY DEPARTMENT Provider Note   CSN: [id number] Arrival date & time: 04/15/18  0116     History   Chief Complaint Chief Complaint  Patient presents with  . Wheezing    HPI Gail Cammack is a 27 y.o. female.  HPI   27 year old female with past medical history as below here with cough and wheezing.  The patient reports that she has a history of wheezing frequently after URIs in the past, but is never been diagnosed with asthma.  She is currently a G3 P2-0-0-2 at estimated 35 weeks 6 gestational days.  She states that over the last 24 hours, she has developed nasal congestion, cough, and wheezing.  She has significant shortness of breath.  She notes that this began after the rain and temperature changes began.  She is had no cough prior to this.  No sputum production.  No fevers or chills.  She strongly denies any unilateral leg swelling, recent immobilization, or history of DVT or PE.  Her pregnancy is otherwise been uncomplicated.  She endorses normal fetal movement, no leakage of fluid, and no vaginal bleeding.  Denies contractions.  Symptoms feel similar to when she has wheezed in the past, though slightly worse.  Past Medical History:  Diagnosis Date  . Allergy   . Chlamydia   . Hx of varicella   . Pregnancy induced hypertension 2014  . Seasonal allergies   . Urinary tract infection   . Vaginal Pap smear, abnormal     Patient Active Problem List   Diagnosis Date Noted  . Supervision of other normal pregnancy, antepartum 10/18/2017  . H/O LEEP 11/23/2015  . History of pregnancy induced hypertension 11/22/2015    Past Surgical History:  Procedure Laterality Date  . LEEP       OB History    Gravida  3   Para  2   Term  2   Preterm  0   AB  0   Living  2     SAB  0   TAB  0   Ectopic  0   Multiple  0   Live Births  2            Home Medications    Prior to Admission medications   Medication Sig Start Date End Date  Taking? Authorizing Provider  acetaminophen (TYLENOL) 500 MG tablet Take 500 mg by mouth every 6 (six) hours as needed for mild pain or headache.    [provider]  aspirin EC 81 MG tablet Take 1 tablet (81 mg total) by mouth daily. Take after 12 weeks for prevention of preeclampsia later in pregnancy 10/18/17   Constant, Peggy, MD  calcium carbonate (TUMS - DOSED IN MG ELEMENTAL CALCIUM) 500 MG chewable tablet Chew 1 tablet by mouth 2 (two) times daily as needed for indigestion or heartburn.    [provider]  Misc. Devices (BREAST PUMP) MISC 1 Device by Does not apply route as needed. 03/20/18   Aviva Signs, CNM  predniSONE (DELTASONE) 20 MG tablet Take 2 tablets (40 mg total) by mouth daily for 4 days. 04/15/18 04/19/18  Shaune Pollack, MD  Prenat w/o A-FE-Methfol-FA-DHA (PNV-DHA) 27-0.6-0.4-300 MG CAPS Take 1 tablet by mouth daily. 10/18/17   Constant, Peggy, MD    Family History Family History  Problem Relation Age of Onset  . Hypertension Father   . Hyperlipidemia Father   . Heart disease Father        enlatged  heart  . Sarcoidosis Father   . Anemia Mother   . Cancer Maternal Grandfather        lung  . Hypertension Sister   . Other Neg Hx     Social History Social History   Tobacco Use  . Smoking status: Never Smoker  . Smokeless tobacco: Never Used  Substance Use Topics  . Alcohol use: No    Comment: occasional  . Drug use: No     Allergies   Patient has no known allergies.   Review of Systems Review of Systems  Constitutional: Positive for fatigue. Negative for chills and fever.  HENT: Positive for congestion. Negative for rhinorrhea.   Eyes: Negative for visual disturbance.  Respiratory: Positive for cough, shortness of breath and wheezing.   Cardiovascular: Negative for chest pain and leg swelling.  Gastrointestinal: Negative for abdominal pain, diarrhea, nausea and vomiting.  Genitourinary: Negative for dysuria and flank pain.    Musculoskeletal: Negative for neck pain and neck stiffness.  Skin: Negative for rash and wound.  Allergic/Immunologic: Negative for immunocompromised state.  Neurological: Positive for weakness. Negative for syncope and headaches.  All other systems reviewed and are negative.    Physical Exam Updated Vital Signs BP (!) 125/91   Pulse (!) 115   Temp 98.1 F (36.7 C) (Oral)   Resp 20   Ht 5\' 9"  (1.753 m)   Wt 101.6 kg   LMP 08/07/2017 (Exact Date)   SpO2 100%   BMI 33.08 kg/m   Physical Exam  Constitutional: She is oriented to person, place, and time. She appears well-developed and well-nourished. No distress.  HENT:  Head: Normocephalic and atraumatic.  Moderate nasal mucosal edema, no discharge or purulence  Eyes: Conjunctivae are normal.  Neck: Neck supple.  Cardiovascular: Regular rhythm and normal heart sounds. Tachycardia present. Exam reveals no friction rub.  No murmur heard. Pulmonary/Chest: Effort normal. Tachypnea noted. No respiratory distress. She has decreased breath sounds. She has wheezes. She has no rales.  Abdominal: She exhibits no distension.  Gravid, soft, NT, ND. No Ctx.  Musculoskeletal: She exhibits no edema.  Neurological: She is alert and oriented to person, place, and time. She exhibits normal muscle tone.  Skin: Skin is warm. Capillary refill takes less than 2 seconds.  Psychiatric: She has a normal mood and affect.  Nursing note and vitals reviewed.    ED Treatments / Results  Labs (all labs ordered are listed, but only abnormal results are displayed) Labs Reviewed - No data to display  EKG None  Radiology No results found.  Procedures Procedures (including critical care time)  Medications Ordered in ED Medications  albuterol (PROVENTIL) (2.5 MG/3ML) 0.083% nebulizer solution 5 mg (5 mg Nebulization Given 04/15/18 0146)  lactated ringers bolus 1,000 mL (0 mLs Intravenous Stopped 04/15/18 0252)  magnesium sulfate IVPB 2 g 50 mL (0  g Intravenous Stopped 04/15/18 0305)  methylPREDNISolone sodium succinate (SOLU-MEDROL) 125 mg/2 mL injection 125 mg (125 mg Intravenous Given 04/15/18 0218)  albuterol (PROVENTIL) (2.5 MG/3ML) 0.083% nebulizer solution 5 mg (5 mg Nebulization Given 04/15/18 0339)  albuterol (PROVENTIL HFA;VENTOLIN HFA) 108 (90 Base) MCG/ACT inhaler 2 puff (2 puffs Inhalation Given 04/15/18 0424)  AEROCHAMBER PLUS FLO-VU MEDIUM MISC 1 each (1 each Other Given 04/15/18 0425)     Initial Impression / Assessment and Plan / ED Course  I have reviewed the triage vital signs and the nursing notes.  Pertinent labs & imaging results that were available during my care of the  patient were reviewed by me and considered in my medical decision making (see chart for details).     27 yo G3P2002 at 2461w6d here with cough, wheezing. I suspect pt has acute on chronic RAd/mild asthma, likely exacerbated 2/2 dramatic recent weather changes, possibly URI. No focal lung findings, fever, or signs of PNA. Given her increased WOB and wheezing, decision made to tx with Albuterol. I [facility] - given her sx, solumedrol and prednisone would be reasonable and safe per discussion. Pt given dose of solumedrol here. Will plan to monitor. [facility] also paged and will obtain NST.  Pt with marked symptomatic improvement after nebs. She is now clear b/l with normal WOB and sats. Ambulatory w/o difficulty. Her NST has been reassuring and pt cleared by [facility] team. Pt remains well appearing and in NAD. Will tx with albuterol, prednisone, and have her call her OB on Monday to discuss. Risks/benefits of steroids, nebs discussed in detail.  Final Clinical Impressions(s) / ED Diagnoses   Final diagnoses:  Mild intermittent reactive airway disease with acute exacerbation    ED Discharge Orders         Ordered    predniSONE (DELTASONE) 20 MG tablet  Daily     04/15/18 0422           Shaune PollackIsaacs, Barney Gertsch,  MD 04/15/18 [phone]

## 2018-04-15 NOTE — ED Triage Notes (Addendum)
Pt c/o sob that started tonight. States remote history of wheezing. States she started coughing at 6pm. Denies fever. States pain with inspiration to anterior chest and posterior lower back.  Pt is [redacted] weeks pregnant. Pt presents with audible wheezing noted and mild sob. Denies any complications with her pregnancy.

## 2018-04-15 NOTE — Progress Notes (Signed)
Shannon,[facility] called to notify of pregnant pt that is on the EFM.Marland Kitchen. Pt came in for shortness of breath and wheezing; pt reports no vaginal bleeding, leaking of fluid, pt reports positive fetal movement; Shannon,RN reports that the ED provider has consulted with OB; OB attending is Dr Vergie LivingPickens and can be reached at [phone]

## 2018-04-15 NOTE — ED Notes (Signed)
MD at bedside. 

## 2018-04-15 NOTE — ED Notes (Signed)
Pt placed on the toco monitor. [facility]

## 2018-04-15 NOTE — ED Notes (Signed)
Husband at bedside.  

## 2018-04-15 NOTE — Discharge Instructions (Addendum)
For your breathing:  You can use the Albuterol (rescue) inhaler every 4 hours as needed  If you find yourself using it more often, call your doctor or return to the ER

## 2018-04-15 NOTE — ED Notes (Signed)
Asked MD to see pt on arrival to room.

## 2018-04-30 ENCOUNTER — Telehealth (HOSPITAL_COMMUNITY): Payer: Self-pay | Admitting: *Deleted

## 2018-04-30 ENCOUNTER — Ambulatory Visit (INDEPENDENT_AMBULATORY_CARE_PROVIDER_SITE_OTHER): Payer: BLUE CROSS/BLUE SHIELD | Admitting: Family Medicine

## 2018-04-30 ENCOUNTER — Other Ambulatory Visit (HOSPITAL_COMMUNITY)
Admission: RE | Admit: 2018-04-30 | Discharge: 2018-04-30 | Disposition: A | Discharge status: Home / Self Care | Payer: BLUE CROSS/BLUE SHIELD | Source: Ambulatory Visit | Attending: Family Medicine | Admitting: Family Medicine

## 2018-04-30 ENCOUNTER — Encounter (HOSPITAL_COMMUNITY): Payer: Self-pay | Admitting: *Deleted

## 2018-04-30 VITALS — BP 119/60 | HR 85 | Wt 229.1 lb

## 2018-04-30 DIAGNOSIS — Z9889 Other specified postprocedural states: Secondary | ICD-10-CM

## 2018-04-30 DIAGNOSIS — Z3483 Encounter for supervision of other normal pregnancy, third trimester: Secondary | ICD-10-CM

## 2018-04-30 DIAGNOSIS — Z8759 Personal history of other complications of pregnancy, childbirth and the puerperium: Secondary | ICD-10-CM

## 2018-04-30 DIAGNOSIS — Z3A38 38 weeks gestation of pregnancy: Secondary | ICD-10-CM | POA: Diagnosis not present

## 2018-04-30 DIAGNOSIS — Z3480 Encounter for supervision of other normal pregnancy, unspecified trimester: Secondary | ICD-10-CM

## 2018-04-30 LAB — OB RESULTS CONSOLE GBS: STREP GROUP B AG: NEGATIVE

## 2018-04-30 LAB — OB RESULTS CONSOLE GC/CHLAMYDIA: GC PROBE AMP, GENITAL: NEGATIVE

## 2018-04-30 NOTE — Progress Notes (Signed)
   PRENATAL VISIT NOTE  Subjective:  Gail Perez is a 27 y.o. G3P2002 at [redacted]w[redacted]d being seen today for ongoing prenatal care.  She is currently monitored for the following issues for this high-risk pregnancy and has History of pregnancy induced hypertension; H/O LEEP; and Supervision of other normal pregnancy, antepartum on their problem list.  Patient reports occasional contractions.  Contractions: Irregular. Vag. Bleeding: Bloody Show.  Movement: Present. Denies leaking of fluid.   The following portions of the patient's history were reviewed and updated as appropriate: allergies, current medications, past family history, past medical history, past social history, past surgical history and problem list. Problem list updated.  Objective:   Vitals:   04/30/18 0820  BP: 119/60  Pulse: 85  Weight: 229 lb 1.3 oz (103.9 kg)    Fetal Status: Fetal Heart Rate (bpm): 146 Fundal Height: 38 cm Movement: Present  Presentation: Vertex  General:  Alert, oriented and cooperative. Patient is in no acute distress.  Skin: Skin is warm and dry. No rash noted.   Cardiovascular: Normal heart rate noted  Respiratory: Normal respiratory effort, no problems with respiration noted  Abdomen: Soft, gravid, appropriate for gestational age.  Pain/Pressure: Present     Pelvic: Cervical exam performed Dilation: 1.5 Effacement (%): 50 Station: -3  Extremities: Normal range of motion.  Edema: Trace  Mental Status: Normal mood and affect. Normal behavior. Normal judgment and thought content.   Assessment and Plan:  Pregnancy: G3P2002 at [redacted]w[redacted]d  1. Supervision of other normal pregnancy, antepartum FHT and FH normal - Culture, beta strep (group b only) - GC/Chlamydia probe amp ([place])not at [facility]  2. H/O LEEP  3. History of pregnancy induced hypertension   Term labor symptoms and general obstetric precautions including but not limited to vaginal bleeding, contractions, leaking of fluid and fetal movement  were reviewed in detail with the patient. Please refer to After Visit Summary for other counseling recommendations.  Return in about 1 week (around 05/07/2018) for OB f/u.  No future appointments.  Levie Heritage, DO

## 2018-04-30 NOTE — Telephone Encounter (Signed)
Preadmission screen  

## 2018-05-01 LAB — GC/CHLAMYDIA PROBE AMP (~~LOC~~) NOT AT ARMC
Chlamydia: NEGATIVE
Neisseria Gonorrhea: NEGATIVE

## 2018-05-06 LAB — CULTURE, BETA STREP (GROUP B ONLY): STREP GP B CULTURE: NEGATIVE

## 2018-05-06 LAB — SPECIMEN STATUS REPORT

## 2018-05-07 ENCOUNTER — Encounter: Payer: Self-pay | Admitting: Obstetrics & Gynecology

## 2018-05-07 ENCOUNTER — Ambulatory Visit (INDEPENDENT_AMBULATORY_CARE_PROVIDER_SITE_OTHER): Payer: BLUE CROSS/BLUE SHIELD | Admitting: Obstetrics & Gynecology

## 2018-05-07 VITALS — BP 116/64 | HR 92 | Wt 229.1 lb

## 2018-05-07 DIAGNOSIS — Z3480 Encounter for supervision of other normal pregnancy, unspecified trimester: Secondary | ICD-10-CM

## 2018-05-07 DIAGNOSIS — Z3483 Encounter for supervision of other normal pregnancy, third trimester: Secondary | ICD-10-CM

## 2018-05-07 DIAGNOSIS — Z8759 Personal history of other complications of pregnancy, childbirth and the puerperium: Secondary | ICD-10-CM

## 2018-05-07 DIAGNOSIS — Z9889 Other specified postprocedural states: Secondary | ICD-10-CM

## 2018-05-07 NOTE — Patient Instructions (Signed)
Labor Induction Labor induction is when steps are taken to cause a pregnant woman to begin the labor process. Most women go into labor on their own between 37 weeks and 42 weeks of the pregnancy. When this does not happen or when there is a medical need, methods may be used to induce labor. Labor induction causes a pregnant woman's uterus to contract. It also causes the cervix to soften (ripen), open (dilate), and thin out (efface). Usually, labor is not induced before 39 weeks of the pregnancy unless there is a problem with the baby or mother. Before inducing labor, your health care provider will consider a number of factors, including the following:  The medical condition of you and the baby.  How many weeks along you are.  The status of the baby's lung maturity.  The condition of the cervix.  The position of the baby.  What are the reasons for labor induction? Labor may be induced for the following reasons:  The health of the baby or mother is at risk.  The pregnancy is overdue by 1 week or more.  The water breaks but labor does not start on its own.  The mother has a health condition or serious illness, such as high blood pressure, infection, placental abruption, or diabetes.  The amniotic fluid amounts are low around the baby.  The baby is distressed.  Convenience or wanting the baby to be born on a certain date is not a reason for inducing labor. What methods are used for labor induction? Several methods of labor induction may be used, such as:  Prostaglandin medicine. This medicine causes the cervix to dilate and ripen. The medicine will also start contractions. It can be taken by mouth or by inserting a suppository into the vagina.  Inserting a thin tube (catheter) with a balloon on the end into the vagina to dilate the cervix. Once inserted, the balloon is expanded with water, which causes the cervix to open.  Stripping the membranes. Your health care provider separates  amniotic sac tissue from the cervix, causing the cervix to be stretched and causing the release of a hormone called progesterone. This may cause the uterus to contract. It is often done during an office visit. You will be sent home to wait for the contractions to begin. You will then come in for an induction.  Breaking the water. Your health care provider makes a hole in the amniotic sac using a small instrument. Once the amniotic sac breaks, contractions should begin. This may still take hours to see an effect.  Medicine to trigger or strengthen contractions. This medicine is given through an IV access tube inserted into a vein in your arm.  All of the methods of induction, besides stripping the membranes, will be done in the hospital. Induction is done in the hospital so that you and the baby can be carefully monitored. How long does it take for labor to be induced? Some inductions can take up to 2-3 days. Depending on the cervix, it usually takes less time. It takes longer when you are induced early in the pregnancy or if this is your first pregnancy. If a mother is still pregnant and the induction has been going on for 2-3 days, either the mother will be sent home or a cesarean delivery will be needed. What are the risks associated with labor induction? Some of the risks of induction include:  Changes in fetal heart rate, such as too high, too low, or erratic.    Fetal distress.  Chance of infection for the mother and baby.  Increased chance of having a cesarean delivery.  Breaking off (abruption) of the placenta from the uterus (rare).  Uterine rupture (very rare).  When induction is needed for medical reasons, the benefits of induction may outweigh the risks. What are some reasons for not inducing labor? Labor induction should not be done if:  It is shown that your baby does not tolerate labor.  You have had previous surgeries on your uterus, such as a myomectomy or the removal of  fibroids.  Your placenta lies very low in the uterus and blocks the opening of the cervix (placenta previa).  Your baby is not in a head-down position.  The umbilical cord drops down into the birth canal in front of the baby. This could cut off the baby's blood and oxygen supply.  You have had a previous cesarean delivery.  There are unusual circumstances, such as the baby being extremely premature.  This information is not intended to replace advice given to you by your health care provider. Make sure you discuss any questions you have with your health care provider. Document Released: 12/28/2006 Document Revised: 01/14/2016 Document Reviewed: 03/07/2013 Elsevier Interactive Patient Education  2017 [employer]  BENEFITS OF BREASTFEEDING Many women wonder if they should breastfeed. Research shows that breast milk contains the perfect balance of vitamins, protein and fat that your baby needs to grow. It also contains antibodies that help your baby's immune system to fight off viruses and bacteria and can reduce the risk of sudden infant death syndrome (SIDS). In addition, the colostrum (a fluid secreted from the breast in the first few days after delivery) helps your newborn's digestive system to grow and function well. Breast milk is easier to digest than formula. Also, if your baby is born preterm, breast milk can help to reduce both short- and long-term health problems. BENEFITS OF BREASTFEEDING FOR MOM . Breastfeeding causes a hormone to be released that helps the uterus to contract and return to its normal size more quickly. . It aids in postpartum weight loss, reduces risk of breast and ovarian cancer, heart disease and rheumatoid arthritis. . It decreases the amount of bleeding after the baby is born. benefits of breastfeeding for baby . Provides comfort and nutrition . Protects baby against - Obesity - Diabetes - Asthma - Childhood cancers - Heart disease - Ear infections -  Diarrhea - Pneumonia - Stomach problems - Serious allergies - Skin rashes . Promotes growth and development . Reduces the risk of baby having Sudden Infant Death Syndrome (SIDS) only breastmilk for the first 6 months . Protects baby against diseases/allergies . It's the perfect amount for tiny bellies . It restores baby's energy . Provides the best nutrition for baby . Giving water or formula can make baby more likely to get sick, decrease Mom's milk supply, make baby less content with breastfeeding Skin to Skin After delivery, the staff will place your baby on your chest. This helps with the following: . Regulates baby's temperature, breathing, heart rate and blood sugar . Increases Mom's milk supply . Promotes bonding . Keeps baby and Mom calm and decreases baby's crying Rooming In Your baby will stay in your room with you for the entire time you are in the hospital. This helps with the following: . Allows Mom to learn baby's feeding cues - Fluttering eyes - Sucking on tongue or hand - Rooting (opens mouth and turns head) - Nuzzling into the  breast - Bringing hand to mouth . Allows breastfeeding on demand (when your baby is ready) . Helps baby to be calm and content . Ensures a good milk supply . Prevents complications with breastfeeding . Allows parents to learn to care for baby . Allows you to request assistance with breastfeeding Importance of a good latch . Increases milk transfer to baby - baby gets enough milk . Ensures you have enough milk for your baby . Decreases nipple soreness . Don't use pacifiers and bottles - these cause baby to suck differently than breastfeeding . Promotes continuation of breastfeeding Risks of Formula Supplementation with Breastfeeding Giving your infant formula in addition to your breast-milk EXCEPT when medically necessary can lead to: Marland Kitchen Decreases your milk supply  . Loss of confidence in yourself for providing baby's nutrition   . Engorgement and possibly mastitis  . Asthma & allergies in the baby BREASTFEEDING FAQS How long should I breastfeed my baby? It is recommended that you provide your baby with breast milk only for the first 6 months and then continue for the first year and longer as desired. During the first few weeks after birth, your baby will need to feed 8-12 times every 24 hours, or every 2-3 hours. They will likely feed for 15-30 minutes. How can I help my baby begin breastfeeding? Babies are born with an instinct to breastfeed. A healthy baby can begin breastfeeding right away without specific help. At the hospital, a nurse (or lactation consultant) will help you begin the process and will give you tips on good positioning. It may be helpful to take a breastfeeding class before you deliver in order to know what to expect. How can I help my baby latch on? In order to assist your baby in latching-on, cup your breast in your hand and stroke your baby's lower lip with your nipple to stimulate your baby's rooting reflex. Your baby will look like he or she is yawning, at which point you should bring the baby towards your breast, while aiming the nipple at the roof of his or her mouth. Remember to bring the baby towards you and not your breast towards the baby. How can I tell if my baby is latched-on? Your baby will have all of your nipple and part of the dark area around the nipple in his or her mouth and your baby's nose will be touching your breast. You should see or hear the baby swallowing. If the baby is not latched-on properly, start the process over. To remove the suction, insert a clean finger between your breast and the baby's mouth. Should I switch breasts during feeding? After feeding on one side, switch the baby to your other breast. If he or she does not continue feeding - that is OK. Your baby will not necessarily need to feed from both breasts in a single feeding. On the next feeding, start with the  other breast for efficiency and comfort. How can I tell if my baby is hungry? When your baby is hungry, they will nuzzle against your breast, make sucking noises and tongue motions and may put their hands near their mouth. Crying is a late sign of hunger, so you should not wait until this point. When they have received enough milk, they will unlatch from the breast. Is it okay to use a pacifier? Until your baby gets the hang of breastfeeding, experts recommend limiting pacifier usage. If you have questions about this, please contact your pediatrician. What can I do  to ensure proper nutrition while breastfeeding? . Make sure that you support your own health and your baby's by eating a healthy, well-balanced diet . Your provider may recommend that you continue to take your prenatal vitamin . Drink plenty of fluids. It is a good rule to drink one glass of water before or after feeding . Alcohol will remain in the breast milk for as long as it will remain in the blood stream. If you choose to have a drink, it is recommended that you wait at least 2 hours before feeding . Moderate amounts of caffeine are OK . Some over-the-counter or prescription medications are not recommended during breastfeeding. Check with your provider if you have questions What types of birth control methods are safe while breastfeeding? Progestin-only methods, including a daily pill, an IUD, the implant and the injection are safe while breastfeeding. Methods that contain estrogen (such as combination birth control pills, the vaginal ring and the patch) should not be used during the first month of breastfeeding as these can decrease your milk supply.

## 2018-05-07 NOTE — Progress Notes (Signed)
   PRENATAL VISIT NOTE  Subjective:  Gail Perez is a 27 y.o. G3P2002 at 6672w0d being seen today for ongoing prenatal care.  She is currently monitored for the following issues for this high-risk pregnancy and has History of pregnancy induced hypertension; H/O LEEP; and Supervision of other normal pregnancy, antepartum on their problem list.  Patient reports occasional contractions.  Contractions: Irregular. Vag. Bleeding: None.  Movement: Present. Denies leaking of fluid.   The following portions of the patient's history were reviewed and updated as appropriate: allergies, current medications, past family history, past medical history, past social history, past surgical history and problem list. Problem list updated.  Objective:   Vitals:   05/07/18 0820  BP: 116/64  Pulse: 92  Weight: 229 lb 1.9 oz (103.9 kg)    Fetal Status: Fetal Heart Rate (bpm): 130   Movement: Present     General:  Alert, oriented and cooperative. Patient is in no acute distress.  Skin: Skin is warm and dry. No rash noted.   Cardiovascular: Normal heart rate noted  Respiratory: Normal respiratory effort, no problems with respiration noted  Abdomen: Soft, gravid, appropriate for gestational age.  Pain/Pressure: Present     Pelvic: Cervical exam performed        Extremities: Normal range of motion.  Edema: Trace  Mental Status: Normal mood and affect. Normal behavior. Normal judgment and thought content.   Assessment and Plan:  Pregnancy: G3P2002 at 3172w0d  1. Supervision of other normal pregnancy, antepartum  2. History of pregnancy induced hypertension BP WNL scheduled for elective IOL next week  Reviewed IOL   3. H/O LEEP  Term labor symptoms and general obstetric precautions including but not limited to vaginal bleeding, contractions, leaking of fluid and fetal movement were reviewed in detail with the patient. Please refer to After Visit Summary for other counseling recommendations.  Return in  about 4 weeks (around 06/04/2018).  Future Appointments  Date Time Provider Department Center  05/14/2018  7:00 AM [facility] None    Willodean Rosenthalarolyn Harraway-Smith, MD

## 2018-05-07 NOTE — Progress Notes (Signed)
116/64 

## 2018-05-09 ENCOUNTER — Encounter (HOSPITAL_COMMUNITY): Payer: Self-pay | Admitting: *Deleted

## 2018-05-09 ENCOUNTER — Inpatient Hospital Stay (HOSPITAL_COMMUNITY)
Admission: AD | Admit: 2018-05-09 | Discharge: 2018-05-09 | Disposition: A | Discharge status: Home / Self Care | Payer: BLUE CROSS/BLUE SHIELD | Source: Ambulatory Visit | Attending: Family Medicine | Admitting: Family Medicine

## 2018-05-09 ENCOUNTER — Other Ambulatory Visit: Payer: Self-pay

## 2018-05-09 DIAGNOSIS — O471 False labor at or after 37 completed weeks of gestation: Secondary | ICD-10-CM | POA: Diagnosis present

## 2018-05-09 DIAGNOSIS — Z3A39 39 weeks gestation of pregnancy: Secondary | ICD-10-CM | POA: Diagnosis not present

## 2018-05-09 NOTE — MAU Note (Signed)
Pt reports to MAU c/ ctx every 3-5 min pt states the pain is 10/10. Pt reports +FM. Bloody show no LOF.

## 2018-05-09 NOTE — Discharge Instructions (Signed)
Vaginal Delivery Vaginal delivery means that you will give birth by pushing your baby out of your birth canal (vagina). A team of health care providers will help you before, during, and after vaginal delivery. Birth experiences are unique for every woman and every pregnancy, and birth experiences vary depending on where you choose to give birth. What should I do to prepare for my baby's birth? Before your baby is born, it is important to talk with your health care provider about:  Your labor and delivery preferences. These may include: ? Medicines that you may be given. ? How you will manage your pain. This might include non-medical pain relief techniques or injectable pain relief such as epidural analgesia. ? How you and your baby will be monitored during labor and delivery. ? Who may be in the labor and delivery room with you. ? Your feelings about surgical delivery of your baby (cesarean delivery, or C-section) if this becomes necessary. ? Your feelings about receiving donated blood through an IV tube (blood transfusion) if this becomes necessary.  Whether you are able: ? To take pictures or videos of the birth. ? To eat during labor and delivery. ? To move around, walk, or change positions during labor and delivery.  What to expect after your baby is born, such as: ? Whether delayed umbilical cord clamping and cutting is offered. ? Who will care for your baby right after birth. ? Medicines or tests that may be recommended for your baby. ? Whether breastfeeding is supported in your hospital or birth center. ? How long you will be in the hospital or birth center.  How any medical conditions you have may affect your baby or your labor and delivery experience.  To prepare for your baby's birth, you should also:  Attend all of your health care visits before delivery (prenatal visits) as recommended by your health care provider. This is important.  Prepare your home for your baby's  arrival. Make sure that you have: ? Diapers. ? Baby clothing. ? Feeding equipment. ? Safe sleeping arrangements for you and your baby.  Install a car seat in your vehicle. Have your car seat checked by a certified car seat installer to make sure that it is installed safely.  Think about who will help you with your new baby at home for at least the first several weeks after delivery.  What can I expect when I arrive at the birth center or hospital? Once you are in labor and have been admitted into the hospital or birth center, your health care provider may:  Review your pregnancy history and any concerns you have.  Insert an IV tube into one of your veins. This is used to give you fluids and medicines.  Check your blood pressure, pulse, temperature, and heart rate (vital signs).  Check whether your bag of water (amniotic sac) has broken (ruptured).  Talk with you about your birth plan and discuss pain control options.  Monitoring Your health care provider may monitor your contractions (uterine monitoring) and your baby's heart rate (fetal monitoring). You may need to be monitored:  Often, but not continuously (intermittently).  All the time or for long periods at a time (continuously). Continuous monitoring may be needed if: ? You are taking certain medicines, such as medicine to relieve pain or make your contractions stronger. ? You have pregnancy or labor complications.  Monitoring may be done by:  Placing a special stethoscope or a handheld monitoring device on your abdomen to   check your baby's heartbeat, and feeling your abdomen for contractions. This method of monitoring does not continuously record your baby's heartbeat or your contractions.  Placing monitors on your abdomen (external monitors) to record your baby's heartbeat and the frequency and length of contractions. You may not have to wear external monitors all the time.  Placing monitors inside of your uterus  (internal monitors) to record your baby's heartbeat and the frequency, length, and strength of your contractions. ? Your health care provider may use internal monitors if he or she needs more information about the strength of your contractions or your baby's heart rate. ? Internal monitors are put in place by passing a thin, flexible wire through your vagina and into your uterus. Depending on the type of monitor, it may remain in your uterus or on your baby's head until birth. ? Your health care provider will discuss the benefits and risks of internal monitoring with you and will ask for your permission before inserting the monitors.  Telemetry. This is a type of continuous monitoring that can be done with external or internal monitors. Instead of having to stay in bed, you are able to move around during telemetry. Ask your health care provider if telemetry is an option for you.  Physical exam Your health care provider may perform a physical exam. This may include:  Checking whether your baby is positioned: ? With the head toward your vagina (head-down). This is most common. ? With the head toward the top of your uterus (head-up or breech). If your baby is in a breech position, your health care provider may try to turn your baby to a head-down position so you can deliver vaginally. If it does not seem that your baby can be born vaginally, your provider may recommend surgery to deliver your baby. In rare cases, you may be able to deliver vaginally if your baby is head-up (breech delivery). ? Lying sideways (transverse). Babies that are lying sideways cannot be delivered vaginally.  Checking your cervix to determine: ? Whether it is thinning out (effacing). ? Whether it is opening up (dilating). ? How low your baby has moved into your birth canal.  What are the three stages of labor and delivery?  Normal labor and delivery is divided into the following three stages: Stage 1  Stage 1 is the  longest stage of labor, and it can last for hours or days. Stage 1 includes: ? Early labor. This is when contractions may be irregular, or regular and mild. Generally, early labor contractions are more than 10 minutes apart. ? Active labor. This is when contractions get longer, more regular, more frequent, and more intense. ? The transition phase. This is when contractions happen very close together, are very intense, and may last longer than during any other part of labor.  Contractions generally feel mild, infrequent, and irregular at first. They get stronger, more frequent (about every 2-3 minutes), and more regular as you progress from early labor through active labor and transition.  Many women progress through stage 1 naturally, but you may need help to continue making progress. If this happens, your health care provider may talk with you about: ? Rupturing your amniotic sac if it has not ruptured yet. ? Giving you medicine to help make your contractions stronger and more frequent.  Stage 1 ends when your cervix is completely dilated to 4 inches (10 cm) and completely effaced. This happens at the end of the transition phase. Stage 2  Once   your cervix is completely effaced and dilated to 4 inches (10 cm), you may start to feel an urge to push. It is common for the body to naturally take a rest before feeling the urge to push, especially if you received an epidural or certain other pain medicines. This rest period may last for up to 1-2 hours, depending on your unique labor experience.  During stage 2, contractions are generally less painful, because pushing helps relieve contraction pain. Instead of contraction pain, you may feel stretching and burning pain, especially when the widest part of your baby's head passes through the vaginal opening (crowning).  Your health care provider will closely monitor your pushing progress and your baby's progress through the vagina during stage 2.  Your  health care provider may massage the area of skin between your vaginal opening and anus (perineum) or apply warm compresses to your perineum. This helps it stretch as the baby's head starts to crown, which can help prevent perineal tearing. ? In some cases, an incision may be made in your perineum (episiotomy) to allow the baby to pass through the vaginal opening. An episiotomy helps to make the opening of the vagina larger to allow more room for the baby to fit through.  It is very important to breathe and focus so your health care provider can control the delivery of your baby's head. Your health care provider may have you decrease the intensity of your pushing, to help prevent perineal tearing.  After delivery of your baby's head, the shoulders and the rest of the body generally deliver very quickly and without difficulty.  Once your baby is delivered, the umbilical cord may be cut right away, or this may be delayed for 1-2 minutes, depending on your baby's health. This may vary among health care providers, hospitals, and birth centers.  If you and your baby are healthy enough, your baby may be placed on your chest or abdomen to help maintain the baby's temperature and to help you bond with each other. Some mothers and babies start breastfeeding at this time. Your health care team will dry your baby and help keep your baby warm during this time.  Your baby may need immediate care if he or she: ? Showed signs of distress during labor. ? Has a medical condition. ? Was born too early (prematurely). ? Had a bowel movement before birth (meconium). ? Shows signs of difficulty transitioning from being inside the uterus to being outside of the uterus. If you are planning to breastfeed, your health care team will help you begin a feeding. Stage 3  The third stage of labor starts immediately after the birth of your baby and ends after you deliver the placenta. The placenta is an organ that develops  during pregnancy to provide oxygen and nutrients to your baby in the womb.  Delivering the placenta may require some pushing, and you may have mild contractions. Breastfeeding can stimulate contractions to help you deliver the placenta.  After the placenta is delivered, your uterus should tighten (contract) and become firm. This helps to stop bleeding in your uterus. To help your uterus contract and to control bleeding, your health care provider may: ? Give you medicine by injection, through an IV tube, by mouth, or through your rectum (rectally). ? Massage your abdomen or perform a vaginal exam to remove any blood clots that are left in your uterus. ? Empty your bladder by placing a thin, flexible tube (catheter) into your bladder. ? Encourage   you to breastfeed your baby. After labor is over, you and your baby will be monitored closely to ensure that you are both healthy until you are ready to go home. Your health care team will teach you how to care for yourself and your baby. This information is not intended to replace advice given to you by your health care provider. Make sure you discuss any questions you have with your health care provider. Document Released: 05/17/2008 Document Revised: 02/26/2016 Document Reviewed: 08/23/2015 Elsevier Interactive Patient Education  2018 [employer]  

## 2018-05-09 NOTE — MAU Provider Note (Signed)
S: Gail Perez is a 27 y.o. G3P2002 at 3950w2d  who presents to MAU today for labor evaluation.     Cervical exam by RN:  Dilation: 3 Effacement (%): 80 Cervical Position: Middle Station: -3 Presentation: Vertex Exam by:: K. WEissRN  Fetal Monitoring: Baseline: 140 Variability: average Accelerations: present Decelerations: absent Contractions: irregular  MDM Discussed patient with RN. NST reviewed.   A: SIUP at 1050w2d  False labor  P: Discharge home Labor precautions and kick counts included in AVS Patient to follow-up with [facility] office as scheduled  Patient may return to MAU as needed or when in labor   Aviva SignsWilliams, Beatris Belen L, [place]M 05/09/2018 3:17 AM

## 2018-05-14 ENCOUNTER — Inpatient Hospital Stay (HOSPITAL_COMMUNITY)
Admission: RE | Admit: 2018-05-14 | Discharge: 2018-05-16 | DRG: 807 | Disposition: A | Discharge status: Home / Self Care | Payer: BLUE CROSS/BLUE SHIELD | Attending: Obstetrics and Gynecology | Admitting: Obstetrics and Gynecology

## 2018-05-14 ENCOUNTER — Encounter (HOSPITAL_COMMUNITY): Payer: Self-pay

## 2018-05-14 ENCOUNTER — Inpatient Hospital Stay (HOSPITAL_COMMUNITY): Payer: BLUE CROSS/BLUE SHIELD | Admitting: Anesthesiology

## 2018-05-14 DIAGNOSIS — Z3A40 40 weeks gestation of pregnancy: Secondary | ICD-10-CM | POA: Diagnosis not present

## 2018-05-14 DIAGNOSIS — Z8759 Personal history of other complications of pregnancy, childbirth and the puerperium: Secondary | ICD-10-CM

## 2018-05-14 DIAGNOSIS — Z3480 Encounter for supervision of other normal pregnancy, unspecified trimester: Secondary | ICD-10-CM

## 2018-05-14 DIAGNOSIS — Z9889 Other specified postprocedural states: Secondary | ICD-10-CM

## 2018-05-14 DIAGNOSIS — O26893 Other specified pregnancy related conditions, third trimester: Secondary | ICD-10-CM | POA: Diagnosis present

## 2018-05-14 LAB — CBC
HEMATOCRIT: 35 % — AB (ref 36.0–46.0)
Hemoglobin: 11.5 g/dL — ABNORMAL LOW (ref 12.0–15.0)
MCH: 26.5 pg (ref 26.0–34.0)
MCHC: 32.9 g/dL (ref 30.0–36.0)
MCV: 80.6 fL (ref 78.0–100.0)
Platelets: 243 10*3/uL (ref 150–400)
RBC: 4.34 MIL/uL (ref 3.87–5.11)
RDW: 15 % (ref 11.5–15.5)
WBC: 10.9 10*3/uL — ABNORMAL HIGH (ref 4.0–10.5)

## 2018-05-14 LAB — TYPE AND SCREEN
ABO/RH(D): O POS
Antibody Screen: NEGATIVE

## 2018-05-14 LAB — RPR: RPR: NONREACTIVE

## 2018-05-14 MED ORDER — OXYTOCIN BOLUS FROM INFUSION
500.0000 mL | Freq: Once | INTRAVENOUS | Status: AC
  Administered 2018-05-15: 500 mL via INTRAVENOUS

## 2018-05-14 MED ORDER — MISOPROSTOL 50MCG HALF TABLET
50.0000 ug | ORAL_TABLET | ORAL | Status: DC | PRN
  Administered 2018-05-14: 50 ug via BUCCAL
  Filled 2018-05-14 (×2): qty 1

## 2018-05-14 MED ORDER — TERBUTALINE SULFATE 1 MG/ML IJ SOLN
0.2500 mg | Freq: Once | INTRAMUSCULAR | Status: DC | PRN
  Filled 2018-05-14: qty 1

## 2018-05-14 MED ORDER — LIDOCAINE HCL (PF) 1 % IJ SOLN
30.0000 mL | INTRAMUSCULAR | Status: DC | PRN
  Filled 2018-05-14: qty 30

## 2018-05-14 MED ORDER — EPHEDRINE 5 MG/ML INJ
10.0000 mg | INTRAVENOUS | Status: DC | PRN
  Filled 2018-05-14: qty 2

## 2018-05-14 MED ORDER — ACETAMINOPHEN 325 MG PO TABS
650.0000 mg | ORAL_TABLET | Freq: Four times a day (QID) | ORAL | Status: DC | PRN
  Administered 2018-05-14: 650 mg via ORAL
  Filled 2018-05-14: qty 2

## 2018-05-14 MED ORDER — BUTORPHANOL TARTRATE 1 MG/ML IJ SOLN: 1.0000 mg | INTRAMUSCULAR | Status: DC | PRN

## 2018-05-14 MED ORDER — PHENYLEPHRINE 40 MCG/ML (10ML) SYRINGE FOR IV PUSH (FOR BLOOD PRESSURE SUPPORT)
80.0000 ug | PREFILLED_SYRINGE | INTRAVENOUS | Status: DC | PRN
  Filled 2018-05-14: qty 5

## 2018-05-14 MED ORDER — OXYCODONE-ACETAMINOPHEN 5-325 MG PO TABS: 1.0000 | ORAL_TABLET | ORAL | Status: DC | PRN

## 2018-05-14 MED ORDER — MISOPROSTOL 25 MCG QUARTER TABLET: 25.0000 ug | ORAL_TABLET | ORAL | Status: DC | PRN

## 2018-05-14 MED ORDER — FENTANYL 2.5 MCG/ML BUPIVACAINE 1/10 % EPIDURAL INFUSION (WH - ANES)
14.0000 mL/h | INTRAMUSCULAR | Status: DC | PRN
  Administered 2018-05-14 (×2): 14 mL/h via EPIDURAL
  Filled 2018-05-14 (×2): qty 100

## 2018-05-14 MED ORDER — PHENYLEPHRINE 40 MCG/ML (10ML) SYRINGE FOR IV PUSH (FOR BLOOD PRESSURE SUPPORT)
80.0000 ug | PREFILLED_SYRINGE | INTRAVENOUS | Status: DC | PRN
  Filled 2018-05-14: qty 5
  Filled 2018-05-14: qty 10

## 2018-05-14 MED ORDER — MISOPROSTOL 50MCG HALF TABLET
50.0000 ug | ORAL_TABLET | ORAL | Status: DC | PRN
  Administered 2018-05-14 (×2): 50 ug via ORAL
  Filled 2018-05-14 (×2): qty 1

## 2018-05-14 MED ORDER — SOD CITRATE-CITRIC ACID 500-334 MG/5ML PO SOLN: 30.0000 mL | ORAL | Status: DC | PRN

## 2018-05-14 MED ORDER — BUPIVACAINE HCL (PF) 0.25 % IJ SOLN
INTRAMUSCULAR | Status: DC | PRN
  Administered 2018-05-14 – 2018-05-15 (×2): 10 mL via EPIDURAL

## 2018-05-14 MED ORDER — OXYTOCIN 40 UNITS IN LACTATED RINGERS INFUSION - SIMPLE MED
1.0000 m[IU]/min | INTRAVENOUS | Status: DC
  Administered 2018-05-14: 2 m[IU]/min via INTRAVENOUS
  Filled 2018-05-14: qty 1000

## 2018-05-14 MED ORDER — DIPHENHYDRAMINE HCL 50 MG/ML IJ SOLN: 12.5000 mg | INTRAMUSCULAR | Status: DC | PRN

## 2018-05-14 MED ORDER — LACTATED RINGERS IV SOLN: 500.0000 mL | Freq: Once | INTRAVENOUS | Status: DC

## 2018-05-14 MED ORDER — LACTATED RINGERS IV SOLN
INTRAVENOUS | Status: DC
  Administered 2018-05-14: 08:00:00 via INTRAVENOUS

## 2018-05-14 MED ORDER — ONDANSETRON HCL 4 MG/2ML IJ SOLN: 4.0000 mg | Freq: Four times a day (QID) | INTRAMUSCULAR | Status: DC | PRN

## 2018-05-14 MED ORDER — LACTATED RINGERS IV SOLN: 500.0000 mL | INTRAVENOUS | Status: DC | PRN

## 2018-05-14 MED ORDER — LIDOCAINE-EPINEPHRINE (PF) 2 %-1:200000 IJ SOLN
INTRAMUSCULAR | Status: DC | PRN
  Administered 2018-05-14: 4 mL via EPIDURAL
  Administered 2018-05-14: 2 mL via EPIDURAL
  Administered 2018-05-14: 4 mL via EPIDURAL

## 2018-05-14 NOTE — Anesthesia Procedure Notes (Signed)
Epidural Patient location during procedure: OB Start time: 05/14/2018 1:35 PM End time: 05/14/2018 1:50 PM  Staffing Anesthesiologist: Elmer PickerWoodrum, Lakrista Scaduto L, MD Performed: anesthesiologist   Preanesthetic Checklist Completed: patient identified, pre-op evaluation, timeout performed, IV checked, risks and benefits discussed and monitors and equipment checked  Epidural Patient position: sitting Prep: site prepped and draped and DuraPrep Patient monitoring: continuous pulse ox, blood pressure, heart rate and cardiac monitor Approach: midline Location: L3-L4 Injection technique: LOR air  Needle:  Needle type: Tuohy  Needle gauge: 17 G Needle length: 9 cm Needle insertion depth: 8 cm Catheter type: closed end flexible Catheter size: 19 Gauge Catheter at skin depth: 15 cm Test dose: negative  Assessment Sensory level: T8 Events: blood not aspirated, injection not painful, no injection resistance, negative IV test and no paresthesia  Additional Notes Patient identified. Risks/Benefits/Options discussed with patient including but not limited to bleeding, infection, nerve damage, paralysis, failed block, incomplete pain control, headache, blood pressure changes, nausea, vomiting, reactions to medication both or allergic, itching and postpartum back pain. Confirmed with bedside nurse the patient's most recent platelet count. Confirmed with patient that they are not currently taking any anticoagulation, have any bleeding history or any family history of bleeding disorders. Patient expressed understanding and wished to proceed. All questions were answered. Sterile technique was used throughout the entire procedure. Please see nursing notes for vital signs. Test dose was given through epidural catheter and negative prior to continuing to dose epidural or start infusion. Warning signs of high block given to the patient including shortness of breath, tingling/numbness in hands, complete motor block,  or any concerning symptoms with instructions to call for help. Patient was given instructions on fall risk and not to get out of bed. All questions and concerns addressed with instructions to call with any issues or inadequate analgesia.  Reason for block:procedure for pain

## 2018-05-14 NOTE — Progress Notes (Signed)
Patient ID: Gail Perez, female   DOB: October 18, 1990, 27 y.o.   MRN: [id number] Doing well.    Vitals:   05/14/18 0736 05/14/18 0738  BP:  134/70  Pulse:  88  Resp:  16  Temp:  (!) 97.4 F (36.3 C)  TempSrc:  Oral  Weight: 106 kg   Height: 5\' 9"  (1.753 m)    FHR reactive Irregular contractions, patient states is starting to feel some pain with them Dilation: 1.5 Effacement (%): Thick Station: -3 Presentation: Vertex(verified by US) Exam by:: Dr. Earlene PlaterWallace  Has received Cytotec.  Declines Foley.  States last time it "got stuck"   WIll continue Cytotec then move to Pitocin

## 2018-05-14 NOTE — Progress Notes (Signed)
Patient ID: Gail Perez, female   DOB: 05-18-1991, 27 y.o.   MRN: [id number] Anesthesia consulting to improve analgesia FHR stable Cytotec given by Dr Homero FellersFrank  Will try Pitocin when this dose is completed  Dilation: 2 Effacement (%): Thick Cervical Position: Posterior Station: -3 Presentation: Vertex Exam by:: Dr. Homero FellersFrank

## 2018-05-14 NOTE — Anesthesia Preprocedure Evaluation (Signed)
Anesthesia Evaluation  Patient identified by MRN, date of birth, ID band Patient awake    Reviewed: Allergy & Precautions, NPO status , Patient's Chart, lab work & pertinent test results  Airway Mallampati: II  TM Distance: >3 FB Neck ROM: Full    Dental no notable dental hx.    Pulmonary    Pulmonary exam normal breath sounds clear to auscultation       Cardiovascular Normal cardiovascular exam Rhythm:Regular Rate:Normal     Neuro/Psych negative neurological ROS  negative psych ROS   GI/Hepatic negative GI ROS, Neg liver ROS,   Endo/Other  negative endocrine ROS  Renal/GU negative Renal ROS  negative genitourinary   Musculoskeletal negative musculoskeletal ROS (+)   Abdominal   Peds  Hematology negative hematology ROS (+)   Anesthesia Other Findings   Reproductive/Obstetrics (+) Pregnancy                             Anesthesia Physical Anesthesia Plan  ASA: II  Anesthesia Plan: Epidural   Post-op Pain Management:    Induction:   PONV Risk Score and Plan: Treatment may vary due to age or medical condition  Airway Management Planned: Natural Airway  Additional Equipment:   Intra-op Plan:   Post-operative Plan:   Informed Consent: I have reviewed the patients History and Physical, chart, labs and discussed the procedure including the risks, benefits and alternatives for the proposed anesthesia with the patient or authorized representative who has indicated his/her understanding and acceptance.     Plan Discussed with:   Anesthesia Plan Comments:         Anesthesia Quick Evaluation

## 2018-05-14 NOTE — Progress Notes (Signed)
Patient ID: Gail Perez, female   DOB: [birth year], 27 y.o.   MRN: [id number] UCs more painful, wants epidural  Vitals:   05/14/18 0736 05/14/18 0738  BP:  134/70  Pulse:  88  Resp:  16  Temp:  (!) 97.4 F (36.3 C)  TempSrc:  Oral  Weight: 106 kg   Height: 5\' 9"  (1.753 m)    FHR stable UCs irregular  Cervix 2cm per RN, still somewhat thick  Will repeat oral Cytotec and get epidural  Wynelle BourgeoisMarie Ranferi Clingan CNM

## 2018-05-14 NOTE — H&P (Signed)
OBSTETRIC ADMISSION HISTORY AND PHYSICAL  Gail Perez is a 27 y.o. female G3P2002 with IUP at 1249w0d by L/8 presenting for induction of labor for history of pregnancy induced hypertension.   Reports fetal movement. Denies vaginal bleeding or leakage of fluids. Thinks this baby is slight bigger than last baby which was around 8lbs.   [facility]  Support person in labor: husband   Ultrasounds . 8w3: dating U/S . 19w0: Normal anatomy U/S, posterior placenta, some limited views of heart and profile . 24w0: normal interval anatomy, appropriate growth, posterior placenta  Prenatal History/Complications: . History of pregnancy induced hypertension - Baseline UPC 0.236 (6/19)  . History of LEEP (2015)  . Overweight   Past Medical History: Past Medical History:  Diagnosis Date  . Allergy   . Chlamydia   . Hx of varicella   . Pregnancy induced hypertension 2014  . Seasonal allergies   . Urinary tract infection   . Vaginal Pap smear, abnormal     Past Surgical History: Past Surgical History:  Procedure Laterality Date  . LEEP      Obstetrical History: OB History    Gravida  3   Para  2   Term  2   Preterm  0   AB  0   Living  2     SAB  0   TAB  0   Ectopic  0   Multiple  0   Live Births  2           Social History: Social History   Socioeconomic History  . Marital status: Married    Spouse name: Not on file  . Number of children: Not on file  . Years of education: Not on file  . Highest education level: Not on file  Occupational History  . Not on file  Social Needs  . Financial resource strain: Not hard at all  . Food insecurity:    Worry: Never true    Inability: Never true  . Transportation needs:    Medical: No    Non-medical: Not on file  Tobacco Use  . Smoking status: Never Smoker  . Smokeless tobacco: Never Used  Substance and Sexual Activity  . Alcohol use: No    Comment: occasional  . Drug use: No  .  Sexual activity: Yes    Birth control/protection: Condom  Lifestyle  . Physical activity:    Days per week: 0 days    Minutes per session: 0 min  . Stress: Not on file  Relationships  . Social connections:    Talks on phone: Not on file    Gets together: Not on file    Attends religious service: Not on file    Active member of club or organization: Not on file    Attends meetings of clubs or organizations: Not on file    Relationship status: Not on file  Other Topics Concern  . Not on file  Social History Narrative  . Not on file    Family History: Family History  Problem Relation Age of Onset  . Hypertension Father   . Hyperlipidemia Father   . Heart disease Father        enlatged heart  . Sarcoidosis Father   . Anemia Mother   . Cancer Maternal Grandfather        lung  . Hypertension Sister   . Other Neg Hx     Allergies: No Known Allergies  Medications Prior to Admission  Medication Sig Dispense Refill Last Dose  . acetaminophen (TYLENOL) 500 MG tablet Take 500 mg by mouth every 6 (six) hours as needed for mild pain or headache.   Past Month at Unknown time  . aspirin EC 81 MG tablet Take 1 tablet (81 mg total) by mouth daily. Take after 12 weeks for prevention of preeclampsia later in pregnancy (Patient not taking: Reported on 04/30/2018) 300 tablet 2 Not Taking  . calcium carbonate (TUMS - DOSED IN MG ELEMENTAL CALCIUM) 500 MG chewable tablet Chew 1 tablet by mouth 2 (two) times daily as needed for indigestion or heartburn.   Past Month at Unknown time  . Misc. Devices (BREAST PUMP) MISC 1 Device by Does not apply route as needed. 1 each 0 Unknown at Unknown time  . Prenat w/o A-FE-Methfol-FA-DHA (PNV-DHA) 27-0.6-0.4-300 MG CAPS Take 1 tablet by mouth daily. 30 capsule 6 05/08/2018 at Unknown time     Review of Systems  All systems reviewed and negative except as stated in HPI BP and other vitals pending  Height 5\' 9"  (1.753 m), weight 106 kg, last menstrual  period 08/07/2017. General appearance: well-appearing, NAD Lungs: no respiratory distress Heart: regular rate  Abdomen: soft, non-tender; gravid b Pelvic: deferred Extremities: Homans sign is negative, no sign of DVT Presentation: cephalic Fetal monitoring: Reactive NST 130s/mod/+a/-d Uterine activity: irritability     Prenatal labs: ABO, Rh: O/Positive/-- (02/27 1617) Antibody: Negative (02/27 1617) Rubella: 6.74 (02/27 1617) RPR: Non Reactive (06/25 0000)  HBsAg: Negative (02/27 1617)  HIV: Non Reactive (06/25 0000)  GBS:   negative Glucola: normal 2-hr Genetic screening:  Normal SMA screening, CF or NIPS not done   Prenatal Transfer Tool  Maternal Diabetes: No Genetic Screening: Normal SMA screening, CF or NIPS not done  Maternal Ultrasounds/Referrals: Normal Fetal Ultrasounds or other Referrals:  None Maternal Substance Abuse:  No Significant Maternal Medications:  None Significant Maternal Lab Results: None  No results found for this or any previous visit (from the past 24 hour(s)).  Patient Active Problem List   Diagnosis Date Noted  . Supervision of other normal pregnancy, antepartum 10/18/2017  . H/O LEEP 11/23/2015  . History of pregnancy induced hypertension 11/22/2015   Assessment/Plan:  Gail Perez is a 27 y.o. G3P2002 at [redacted]w[redacted]d here for induction of labor given history of pregnancy-induced hypertension.   Labor: Induction. Will start with buccal cytotec. Patient had negative experience with FB in first pregnancy so will start with ripening then reassess at next check. -- pain control: planning for epidural  Fetal Wellbeing: EFW 8lbs by Leopold's. Cephalic by exam.  -- GBS (-) -- continuous fetal monitoring - reactive NST    Postpartum Planning -- breast/IUD -- RI/[x] Tdap   Gail Heinzelman S. Earlene Plater, DO OB/GYN Fellow

## 2018-05-14 NOTE — Progress Notes (Signed)
Labor Progress Note Carney Bernyisha Gladue is a 27 y.o. G3P2002 at 25103w0d presented for elective IOL.  S: Pt already has epidural in place, minimal sensation w/ contractions.  O:  BP (!) 148/83   Pulse 89   Temp (!) 97.4 F (36.3 C) (Oral)   Resp 18   Ht 5\' 9"  (1.753 m)   Wt 106 kg   LMP 08/07/2017 (Exact Date)   BMI 34.50 kg/m  EFM: 130/moderate variability/pos accels/one variable decel  CVE: Dilation: 2 Effacement (%): Thick Cervical Position: Posterior Station: -3 Presentation: Vertex Exam by:: Dr. Homero FellersFrank   A&P: 27 y.o. [id number] 11103w0d elective IOL. #Labor: Progressing slowly. PO cytotec x3 #Pain: epidural in place, minimal sensation of contractions #FWB: category II due to one variable, overall reassuring #GBS negative   Mirian MoPeter Tytianna Greenley, MD 4:41 PM

## 2018-05-14 NOTE — Progress Notes (Signed)
Faculty Note  Pt is 27 yo G3P2002 @ 1060w0d for IOL for h/o PIH. S/p cytotec x3. Now on pitocin. Epidural in place. Cat I tracing. BP well controlled. Cont pitocin augmentation.  Baldemar LenisK. Meryl Jamari Moten, M.D. [facility]

## 2018-05-14 NOTE — Anesthesia Pain Management Evaluation Note (Signed)
  CRNA Pain Management Visit Note  Patient: Gail Perez, 27 y.o., female  "Hello I am a member of the anesthesia team at [facility]. We have an anesthesia team available at all times to provide care throughout the hospital, including epidural management and anesthesia for C-section. I don't know your plan for the delivery whether it a natural birth, water birth, IV sedation, nitrous supplementation, doula or epidural, but we want to meet your pain goals."   1.Was your pain managed to your expectations on prior hospitalizations?   Yes   2.What is your expectation for pain management during this hospitalization?     Epidural  3.How can we help you reach that goal? Epidural when desired  Record the patient's initial score and the patient's pain goal.   Pain: 2  Pain Goal: 8 [facility] wants you to be able to say your pain was always managed very well.  Gail Perez 05/14/2018

## 2018-05-15 ENCOUNTER — Encounter (HOSPITAL_COMMUNITY): Payer: Self-pay

## 2018-05-15 DIAGNOSIS — Z3A40 40 weeks gestation of pregnancy: Secondary | ICD-10-CM

## 2018-05-15 MED ORDER — IBUPROFEN 600 MG PO TABS
600.0000 mg | ORAL_TABLET | Freq: Four times a day (QID) | ORAL | Status: DC
  Administered 2018-05-15 – 2018-05-16 (×6): 600 mg via ORAL
  Filled 2018-05-15 (×6): qty 1

## 2018-05-15 MED ORDER — OXYTOCIN 40 UNITS IN LACTATED RINGERS INFUSION - SIMPLE MED
2.5000 [IU]/h | INTRAVENOUS | Status: DC
  Administered 2018-05-15: 2.5 [IU]/h via INTRAVENOUS

## 2018-05-15 MED ORDER — MEASLES, MUMPS & RUBELLA VAC ~~LOC~~ INJ
0.5000 mL | INJECTION | Freq: Once | SUBCUTANEOUS | Status: DC
  Filled 2018-05-15: qty 0.5

## 2018-05-15 MED ORDER — ONDANSETRON HCL 4 MG/2ML IJ SOLN: 4.0000 mg | INTRAMUSCULAR | Status: DC | PRN

## 2018-05-15 MED ORDER — COCONUT OIL OIL
1.0000 "application " | TOPICAL_OIL | Status: DC | PRN
  Administered 2018-05-16: 1 via TOPICAL
  Filled 2018-05-15: qty 120

## 2018-05-15 MED ORDER — ZOLPIDEM TARTRATE 5 MG PO TABS: 5.0000 mg | ORAL_TABLET | Freq: Every evening | ORAL | Status: DC | PRN

## 2018-05-15 MED ORDER — BENZOCAINE-MENTHOL 20-0.5 % EX AERO
1.0000 "application " | INHALATION_SPRAY | CUTANEOUS | Status: DC | PRN
  Administered 2018-05-16: 1 via TOPICAL
  Filled 2018-05-15: qty 56

## 2018-05-15 MED ORDER — ACETAMINOPHEN 325 MG PO TABS
650.0000 mg | ORAL_TABLET | ORAL | Status: DC | PRN
  Administered 2018-05-15 – 2018-05-16 (×3): 650 mg via ORAL
  Filled 2018-05-15 (×3): qty 2

## 2018-05-15 MED ORDER — ONDANSETRON HCL 4 MG PO TABS: 4.0000 mg | ORAL_TABLET | ORAL | Status: DC | PRN

## 2018-05-15 MED ORDER — DIBUCAINE 1 % RE OINT: 1.0000 "application " | TOPICAL_OINTMENT | RECTAL | Status: DC | PRN

## 2018-05-15 MED ORDER — SIMETHICONE 80 MG PO CHEW: 80.0000 mg | CHEWABLE_TABLET | ORAL | Status: DC | PRN

## 2018-05-15 MED ORDER — TETANUS-DIPHTH-ACELL PERTUSSIS 5-2.5-18.5 LF-MCG/0.5 IM SUSP: 0.5000 mL | Freq: Once | INTRAMUSCULAR | Status: DC

## 2018-05-15 MED ORDER — DIPHENHYDRAMINE HCL 25 MG PO CAPS: 25.0000 mg | ORAL_CAPSULE | Freq: Four times a day (QID) | ORAL | Status: DC | PRN

## 2018-05-15 MED ORDER — PRENATAL MULTIVITAMIN CH
1.0000 | ORAL_TABLET | Freq: Every day | ORAL | Status: DC
  Administered 2018-05-15 – 2018-05-16 (×2): 1 via ORAL
  Filled 2018-05-15 (×2): qty 1

## 2018-05-15 MED ORDER — WITCH HAZEL-GLYCERIN EX PADS: 1.0000 "application " | MEDICATED_PAD | CUTANEOUS | Status: DC | PRN

## 2018-05-15 MED ORDER — SENNOSIDES-DOCUSATE SODIUM 8.6-50 MG PO TABS
2.0000 | ORAL_TABLET | ORAL | Status: DC
  Administered 2018-05-15: 2 via ORAL
  Filled 2018-05-15: qty 2

## 2018-05-15 NOTE — Anesthesia Postprocedure Evaluation (Signed)
Anesthesia Post Note  Patient: Gail Perez  Procedure(s) Performed: AN AD HOC LABOR EPIDURAL     Patient location during evaluation: Mother Baby Anesthesia Type: Epidural Level of consciousness: awake and alert Pain management: pain level controlled Vital Signs Assessment: post-procedure vital signs reviewed and stable Respiratory status: spontaneous breathing, nonlabored ventilation and respiratory function stable Cardiovascular status: stable Postop Assessment: no headache, no backache, epidural receding, no apparent nausea or vomiting, patient able to bend at knees, adequate PO intake and able to ambulate Anesthetic complications: no    Last Vitals:  Vitals:   05/15/18 0300 05/15/18 0353  BP: 134/79 132/63  Pulse: 80 81  Resp: 18 18  Temp: 36.7 C 36.9 C    Last Pain:  Vitals:   05/15/18 0703  TempSrc:   PainSc: 6    Pain Goal:                 Laban EmperorMalinova,Almas Rake Hristova

## 2018-05-15 NOTE — Lactation Note (Signed)
This note was copied from a baby's chart. Lactation Consultation Note  Patient Name: Girl Berneda Volkov ZOXWR'UToday's Date: 05/15/2018 ReasoCarney Bernn for consult: Initial assessment;Term Mom reports she breastfed with her first baby for 3 months.  Second baby for 1 month.  Mom reports getting frustrated with breastfeeding with both babies.  Mom reports got sick with second baby and quit sooner than expected.  Mom reports her goal with this one is to breastfeed for 1 year.  Mom has DEBP for home use.  Will go back to work in January.  Infant in crib and cuing on arrival.  Mom reports she just fed her.  Assisted in unwrapping infant and placing next to mom on pillows in football/clutch position.  Infant fell right back asleep and would not latch.  Showed parents how to feed small drops of colostrum to her via spoon.  Infant did not wake to feed. Did swallow small drops of colostrum. Mom reports she would like to take a nap.  Swaddled infant and put back in crib.  Gave and reviewed handouts. Gave Breastfeeding Resource list and breastfeeding [facility] handout.  Urged mom to come to [facility].  Urged mom to offer the breast based on infant hunger cues and every 3 hours.  Praised moms breastfeeding goal of 1 year.   Maternal Data Formula Feeding for Exclusion: No Has patient been taught Hand Expression?: Yes Does the patient have breastfeeding experience prior to this delivery?: Yes  Feeding Feeding Type: Breast Fed Length of feed: 0 min  LATCH Score Latch: Too sleepy or reluctant, no latch achieved, no sucking elicited.  Audible Swallowing: None  Type of Nipple: Everted at rest and after stimulation  Comfort (Breast/Nipple): Soft / non-tender  Hold (Positioning): Assistance needed to correctly position infant at breast and maintain latch.  LATCH Score: 5  Interventions Interventions: Hand express;Expressed milk;Assisted with latch;Adjust position;Support pillows  Lactation Tools  Discussed/Used WIC Program: No   Consult Status Consult Status: Follow-up Date: 05/16/18 Follow-up type: In-patient    Melanee Cordial Michaelle CopasS Mayu Ronk 05/15/2018, 9:22 AM

## 2018-05-16 ENCOUNTER — Other Ambulatory Visit: Payer: Self-pay

## 2018-05-16 LAB — BIRTH TISSUE RECOVERY COLLECTION (PLACENTA DONATION)

## 2018-05-16 MED ORDER — IBUPROFEN 600 MG PO TABS: 600.0000 mg | ORAL_TABLET | Freq: Four times a day (QID) | ORAL | 0 refills | Status: DC

## 2018-05-16 NOTE — Discharge Summary (Signed)
Obstetric Discharge Summary Reason for Admission: onset of labor Prenatal Procedures: none Intrapartum Procedures: spontaneous vaginal delivery Postpartum Procedures: none Complications-Operative and Postpartum: none Hemoglobin  Date Value Ref Range Status  05/14/2018 11.5 (L) 12.0 - 15.0 g/dL Final  16/10/960406/25/2019 54.011.3 11.1 - 15.9 g/dL Final   HCT  Date Value Ref Range Status  05/14/2018 35.0 (L) 36.0 - 46.0 % Final   Hematocrit  Date Value Ref Range Status  02/13/2018 34.1 34.0 - 46.6 % Final    Physical Exam:  General: alert, cooperative and no distress Lochia: appropriate Uterine Fundus: firm DVT Evaluation: No evidence of DVT seen on physical exam. Negative Homan's sign. No cords or calf tenderness. No significant calf/ankle edema.  Discharge Diagnoses: Term Pregnancy-delivered  Discharge Information: Date: 05/16/2018 Activity: pelvic rest Diet: routine Medications: Ibuprofen Condition: stable Instructions: refer to practice specific booklet Discharge to: home Follow-up Information    [facility] [place] [facility] Follow up in 4 week(s).   Specialty:  Obstetrics and Gynecology Contact information: [address] [place] [phone]          Newborn Data: Live born female  Birth Weight: 6 lb 1.3 oz (2757 g) APGAR: 9, 9  Newborn Delivery   Birth date/time:  05/15/2018 01:14:00 Delivery type:  Vaginal, Spontaneous     Home with mother.  Levie HeritageJacob J Stinson 05/16/2018, 7:56 AM

## 2018-05-16 NOTE — Discharge Instructions (Signed)
Vaginal Delivery, Care After °Refer to this sheet in the next few weeks. These instructions provide you with information about caring for yourself after vaginal delivery. Your health care provider may also give you more specific instructions. Your treatment has been planned according to current medical practices, but problems sometimes occur. Call your health care provider if you have any problems or questions. °What can I expect after the procedure? °After vaginal delivery, it is common to have: °· Some bleeding from your vagina. °· Soreness in your abdomen, your vagina, and the area of skin between your vaginal opening and your anus (perineum). °· Pelvic cramps. °· Fatigue. ° °Follow these instructions at home: °Medicines °· Take over-the-counter and prescription medicines only as told by your health care provider. °· If you were prescribed an antibiotic medicine, take it as told by your health care provider. Do not stop taking the antibiotic until it is finished. °Driving ° °· Do not drive or operate heavy machinery while taking prescription pain medicine. °· Do not drive for 24 hours if you received a sedative. °Lifestyle °· Do not drink alcohol. This is especially important if you are breastfeeding or taking medicine to relieve pain. °· Do not use tobacco products, including cigarettes, chewing tobacco, or e-cigarettes. If you need help quitting, ask your health care provider. °Eating and drinking °· Drink at least 8 eight-ounce glasses of water every day unless you are told not to by your health care provider. If you choose to breastfeed your baby, you may need to drink more water than this. °· Eat high-fiber foods every day. These foods may help prevent or relieve constipation. High-fiber foods include: °? Whole grain cereals and breads. °? Brown rice. °? Beans. °? Fresh fruits and vegetables. °Activity °· Return to your normal activities as told by your health care provider. Ask your health care provider  what activities are safe for you. °· Rest as much as possible. Try to rest or take a nap when your baby is sleeping. °· Do not lift anything that is heavier than your baby or 10 lb (4.5 kg) until your health care provider says that it is safe. °· Talk with your health care provider about when you can engage in sexual activity. This may depend on your: °? Risk of infection. °? Rate of healing. °? Comfort and desire to engage in sexual activity. °Vaginal Care °· If you have an episiotomy or a vaginal tear, check the area every day for signs of infection. Check for: °? More redness, swelling, or pain. °? More fluid or blood. °? Warmth. °? Pus or a bad smell. °· Do not use tampons or douches until your health care provider says this is safe. °· Watch for any blood clots that may pass from your vagina. These may look like clumps of dark red, brown, or black discharge. °General instructions °· Keep your perineum clean and dry as told by your health care provider. °· Wear loose, comfortable clothing. °· Wipe from front to back when you use the toilet. °· Ask your health care provider if you can shower or take a bath. If you had an episiotomy or a perineal tear during labor and delivery, your health care provider may tell you not to take baths for a certain length of time. °· Wear a bra that supports your breasts and fits you well. °· If possible, have someone help you with household activities and help care for your baby for at least a few days after   you leave the hospital. °· Keep all follow-up visits for you and your baby as told by your health care provider. This is important. °Contact a health care provider if: °· You have: °? Vaginal discharge that has a bad smell. °? Difficulty urinating. °? Pain when urinating. °? A sudden increase or decrease in the frequency of your bowel movements. °? More redness, swelling, or pain around your episiotomy or vaginal tear. °? More fluid or blood coming from your episiotomy or  vaginal tear. °? Pus or a bad smell coming from your episiotomy or vaginal tear. °? A fever. °? A rash. °? Little or no interest in activities you used to enjoy. °? Questions about caring for yourself or your baby. °· Your episiotomy or vaginal tear feels warm to the touch. °· Your episiotomy or vaginal tear is separating or does not appear to be healing. °· Your breasts are painful, hard, or turn red. °· You feel unusually sad or worried. °· You feel nauseous or you vomit. °· You pass large blood clots from your vagina. If you pass a blood clot from your vagina, save it to show to your health care provider. Do not flush blood clots down the toilet without having your health care provider look at them. °· You urinate more than usual. °· You are dizzy or light-headed. °· You have not breastfed at all and you have not had a menstrual period for 12 weeks after delivery. °· You have stopped breastfeeding and you have not had a menstrual period for 12 weeks after you stopped breastfeeding. °Get help right away if: °· You have: °? Pain that does not go away or does not get better with medicine. °? Chest pain. °? Difficulty breathing. °? Blurred vision or spots in your vision. °? Thoughts about hurting yourself or your baby. °· You develop pain in your abdomen or in one of your legs. °· You develop a severe headache. °· You faint. °· You bleed from your vagina so much that you fill two sanitary pads in one hour. °This information is not intended to replace advice given to you by your health care provider. Make sure you discuss any questions you have with your health care provider. °Document Released: 08/05/2000 Document Revised: 01/20/2016 Document Reviewed: 08/23/2015 °Elsevier Interactive Patient Education © 2018 [employer] Inc[employer] ° °

## 2018-05-16 NOTE — Lactation Note (Addendum)
This note was copied from a baby's chart. Lactation Consultation Note  Patient Name: [name] [id number] Date: 05/16/2018 Reason for consult: Follow-up assessment  Visited with P3 Mom of 33 hr old term baby, baby at 7.7% weight loss.  Mom states baby has been latching well on her right side (left nipple is flatter).  Mom states baby latched onto left side after birth, but not since.    Assisted with positioning baby on right breast in football hold.  Mom needing assistance with breast support and sandwiching, and how to bring baby onto breast.  Mom trying to push nipple into baby's mouth before baby opens widely.   Baby latched after a couple attempts.  Baby latched deeply.  Taught Mom how to use alternate breast compression during feeding.  Swallowing identified for Mom.  Baby seems contented on breast.    Showed Mom how to pre-pump on left breast.  Nipple pulled out well.  Ped came in to examine baby and baby was sleepy after 40 mins on the breast.    Recommended Mom stay and latch baby onto both breasts independently with a deep latch, before discharging home.  To call her RN for assistance with latch prn.  Mom aware of importance of keeping baby STS, and feeding her often when she cues to eat.  Goal of 8-12 feedings per 24 hrs.  Mom aware of engorgement prevention and treatment.    Mom aware of OP Lactation support available to her.  Mom has a DEBP at home.  To pump left breast if baby is unable to latch at a feeding, 4-6 times per 24 hrs.  Consult Status Consult Status: Complete Date: 05/17/18 Follow-up type: Call as needed    Judee Clara 05/16/2018, 10:32 AM

## 2018-05-28 ENCOUNTER — Ambulatory Visit: Payer: BLUE CROSS/BLUE SHIELD | Admitting: Family Medicine

## 2018-05-28 ENCOUNTER — Encounter: Payer: Self-pay | Admitting: Family Medicine

## 2018-05-28 VITALS — BP 125/71 | HR 65 | Ht 69.0 in | Wt 213.1 lb

## 2018-05-28 DIAGNOSIS — O133 Gestational [pregnancy-induced] hypertension without significant proteinuria, third trimester: Secondary | ICD-10-CM

## 2018-05-28 NOTE — Progress Notes (Signed)
Nurse visit for BP check only. BP normal. F/u in 2-3 weeks for PP visit

## 2018-06-22 ENCOUNTER — Ambulatory Visit: Payer: BLUE CROSS/BLUE SHIELD | Admitting: Family Medicine

## 2018-06-28 ENCOUNTER — Ambulatory Visit (INDEPENDENT_AMBULATORY_CARE_PROVIDER_SITE_OTHER): Payer: BLUE CROSS/BLUE SHIELD | Admitting: Family Medicine

## 2018-06-28 ENCOUNTER — Encounter: Payer: Self-pay | Admitting: Family Medicine

## 2018-06-28 VITALS — BP 135/80 | HR 67 | Ht 69.0 in | Wt 200.0 lb

## 2018-06-28 DIAGNOSIS — Z3202 Encounter for pregnancy test, result negative: Secondary | ICD-10-CM

## 2018-06-28 DIAGNOSIS — Z30430 Encounter for insertion of intrauterine contraceptive device: Secondary | ICD-10-CM

## 2018-06-28 DIAGNOSIS — Z30019 Encounter for initial prescription of contraceptives, unspecified: Secondary | ICD-10-CM

## 2018-06-28 DIAGNOSIS — Z1389 Encounter for screening for other disorder: Secondary | ICD-10-CM

## 2018-06-28 LAB — POCT URINE PREGNANCY: Preg Test, Ur: NEGATIVE

## 2018-06-28 MED ORDER — LEVONORGESTREL 19.5 MCG/DAY IU IUD
INTRAUTERINE_SYSTEM | Freq: Once | INTRAUTERINE | Status: AC
  Administered 2018-06-28: 14:00:00 via INTRAUTERINE

## 2018-06-28 NOTE — Addendum Note (Signed)
Addended by: Mikey Bussing on: 06/28/2018 02:23 PM   Modules accepted: Orders

## 2018-06-28 NOTE — Patient Instructions (Signed)

## 2018-06-28 NOTE — Progress Notes (Signed)
Subjective:     Gail Perez is a 27 y.o. female who presents for a postpartum visit. She is 6 weeks postpartum following a spontaneous vaginal delivery. I have fully reviewed the prenatal and intrapartum course. The delivery was at 40 gestational weeks. Outcome: spontaneous vaginal delivery. Anesthesia: epidural. Postpartum course has been normal. Baby's course has been normal. Baby is feeding by breast. Bleeding no bleeding. Bowel function is normal. Bladder function is normal. Patient is not sexually active. Contraception method is IUD. Postpartum depression screening: negative.  The following portions of the patient's history were reviewed and updated as appropriate: allergies, current medications, past family history, past medical history, past social history, past surgical history and problem list.  Review of Systems Pertinent items are noted in HPI.   Objective:    LMP 08/07/2017 (Exact Date)   General:  alert, cooperative and no distress  Lungs: clear to auscultation bilaterally  Heart:  regular rate and rhythm, S1, S2 normal, no murmur, click, rub or gallop  Abdomen: soft, non-tender; bowel sounds normal; no masses,  no organomegaly   Vulva:  normal  Vagina: normal vagina, no discharge, exudate, lesion, or erythema  Cervix:  multiparous appearance         IUD Procedure Note Patient identified, informed consent performed, signed copy in chart, time out was performed.  Urine pregnancy test negative.  Speculum placed in the vagina.  Cervix visualized.  Cleaned with Betadine x 2.  Grasped anteriorly with a single tooth tenaculum.  Uterus sounded to 9 cm.  Liletta  IUD placed per manufacturer's recommendations.  Strings trimmed to 3 cm. Tenaculum was removed, good hemostasis noted.  Patient tolerated procedure well.   Patient given post procedure instructions and Liletta care card with expiration date.  Patient is asked to check IUD strings periodically and follow up in 4-6 weeks for IUD  check.   Assessment:     normal postpartum exam.   Plan:    1. Contraception: IUD 2. Follow up in: 1 month or as needed.

## 2018-07-27 ENCOUNTER — Ambulatory Visit: Payer: BLUE CROSS/BLUE SHIELD | Admitting: Family Medicine

## 2018-08-09 ENCOUNTER — Ambulatory Visit: Payer: BLUE CROSS/BLUE SHIELD | Admitting: Family Medicine

## 2018-08-09 DIAGNOSIS — Z09 Encounter for follow-up examination after completed treatment for conditions other than malignant neoplasm: Secondary | ICD-10-CM

## 2018-09-20 ENCOUNTER — Encounter: Payer: Self-pay | Admitting: Family Medicine

## 2018-09-20 ENCOUNTER — Ambulatory Visit (INDEPENDENT_AMBULATORY_CARE_PROVIDER_SITE_OTHER): Payer: BLUE CROSS/BLUE SHIELD | Admitting: Family Medicine

## 2018-09-20 VITALS — BP 145/84 | HR 82 | Ht 69.0 in | Wt 198.0 lb

## 2018-09-20 DIAGNOSIS — Z30431 Encounter for routine checking of intrauterine contraceptive device: Secondary | ICD-10-CM | POA: Diagnosis not present

## 2018-09-20 MED ORDER — DOXYCYCLINE HYCLATE 100 MG PO CAPS: 100.0000 mg | ORAL_CAPSULE | Freq: Two times a day (BID) | ORAL | 0 refills | Status: AC

## 2018-09-20 NOTE — Progress Notes (Signed)
Patient having bleeding today. Patient would like to just talk about bleeding pattern.  Armandina Stammer RN

## 2018-09-20 NOTE — Progress Notes (Signed)
   Subjective:   Patient Name: Gail Perez, female   DOB: 12/29/1990, 28 y.o.  MRN: [id number]  HPI Patient here for an IUD check.  She had the Liletta IUD placed 3 month ago.  She reports irregular bleeding and cramping.   Review of Systems  Constitutional: Negative for fever and chills.  Gastrointestinal: Negative for abdominal pain.  Genitourinary: Negative for vaginal discharge, vaginal pain, pelvic pain and dyspareunia.        Objective:   Physical Exam  Constitutional: She appears well-developed and well-nourished.  HENT:  Head: Normocephalic and atraumatic.  Abdominal: Soft. There is no tenderness. There is no guarding.  Genitourinary: There is no rash, tenderness or lesion on the right labia. There is no rash, tenderness or lesion on the left labia. No erythema or tenderness in the vagina. No foreign body around the vagina. No signs of injury around the vagina. No vaginal discharge found.    Skin: Skin is warm and dry.  Psychiatric: She has a normal mood and affect. Her behavior is normal. Judgment and thought content normal.       Assessment & Plan:  1. IUD check up IUD in place.  Will prescribe doxycycline x 10 days for underlying inflammation/infection. If this doesn't resolved the bleeding and cramping, will change to progesterone. Pt to call with any other problems.  Recheck in 1 year.

## 2019-01-30 ENCOUNTER — Encounter: Payer: Self-pay | Admitting: Medical

## 2019-01-31 ENCOUNTER — Other Ambulatory Visit: Payer: Self-pay

## 2019-01-31 ENCOUNTER — Ambulatory Visit (INDEPENDENT_AMBULATORY_CARE_PROVIDER_SITE_OTHER): Payer: Self-pay | Admitting: Medical

## 2019-01-31 ENCOUNTER — Encounter: Payer: Self-pay | Admitting: Medical

## 2019-01-31 DIAGNOSIS — B379 Candidiasis, unspecified: Secondary | ICD-10-CM

## 2019-01-31 MED ORDER — FLUCONAZOLE 150 MG PO TABS: 150.0000 mg | ORAL_TABLET | Freq: Once | ORAL | 0 refills | Status: AC

## 2019-01-31 NOTE — Progress Notes (Signed)
   Subjective:    Patient ID: Gail Perez, female    DOB: 02-13-1991, 28 y.o.   MRN: [id number]  HPI  Virtual Visit via Video Note  I connected with Gail Perez on 01/31/19 at  9:20 AM EDT by a video enabled telemedicine application and verified that I am speaking with the correct person using two identifiers.  Location: Patient: home Provider: office  Video briefly before went.  Pt did not check bp or pulse since no cuff   I discussed the limitations of evaluation and management by telemedicine and the availability of in person appointments. The patient expressed understanding and agreed to proceed.  History of Present Illness: Pt reports some vaginal area itch for about 4 days ago. She states used monistat for one day and resolved symptoms. Yesterday had symptom of itch and then today fine again. No white dc. No antibiotics recently.   Pt states she has iud. Mirena.  On review no uti signs or symptoms. No vaginal odor.    Observations/Objective: General-no acute distress, pleasant, oriented. Lungs- on inspection lungs appear unlabored. Neck- no tracheal deviation or jvd on inspection. Neuro- gross motor function appears intact.    Assessment and Plan: Probable yeast infection causing vaginal itch. Rx diflucan tab x 1 day. Update me by Monday if any symptoms persist. If so the will need to urine ancillary studies early next week.  Follow up to be determined depending on clinical response to tx.  Follow Up Instructions:    I discussed the assessment and treatment plan with the patient. The patient was provided an opportunity to ask questions and all were answered. The patient agreed with the plan and demonstrated an understanding of the instructions.   The patient was advised to call back or seek an in-person evaluation if the symptoms worsen or if the condition fails to improve as anticipated.  I provided 15 minutes of non-face-to-face time during this encounter.    Mackie Pai, PA-C    Review of Systems  Constitutional: Negative for chills, fatigue and fever.  Respiratory: Negative for cough, chest tightness and shortness of breath.   Cardiovascular: Negative for chest pain and palpitations.  Genitourinary: Negative for difficulty urinating, dysuria, flank pain, frequency, hematuria, pelvic pain, urgency, vaginal bleeding and vaginal pain.       Itching as discussed on hpi.  Musculoskeletal: Negative for back pain.  Hematological: Negative for adenopathy. Does not bruise/bleed easily.  Psychiatric/Behavioral: Negative for behavioral problems and decreased concentration.       Objective:   Physical Exam        Assessment & Plan:

## 2019-01-31 NOTE — Patient Instructions (Signed)
Probable yeast infection causing vaginal itch. Rx diflucan tab x 1 day. Update me by Monday if any symptoms persist. If so the will need to urine ancillary studies early next week.  Follow up to be determined depending on clinical response to tx.

## 2019-02-22 ENCOUNTER — Encounter: Payer: Self-pay | Admitting: Medical

## 2019-02-27 ENCOUNTER — Telehealth: Payer: Self-pay | Admitting: Medical

## 2019-02-27 MED ORDER — FLUCONAZOLE 150 MG PO TABS: 150.0000 mg | ORAL_TABLET | Freq: Once | ORAL | 0 refills | Status: AC

## 2019-02-27 NOTE — Telephone Encounter (Signed)
Diflucan sent to pt pharmacy.

## 2019-02-27 NOTE — Telephone Encounter (Signed)
Sent diflucan to pt pharmacy.

## 2019-03-20 ENCOUNTER — Other Ambulatory Visit: Payer: Self-pay

## 2019-03-21 ENCOUNTER — Ambulatory Visit (INDEPENDENT_AMBULATORY_CARE_PROVIDER_SITE_OTHER): Payer: Self-pay | Admitting: Medical

## 2019-03-21 ENCOUNTER — Encounter: Payer: Self-pay | Admitting: Medical

## 2019-03-21 VITALS — BP 135/77 | HR 72 | Temp 98.2°F | Resp 16 | Ht 69.0 in | Wt 193.0 lb

## 2019-03-21 DIAGNOSIS — S46811A Strain of other muscles, fascia and tendons at shoulder and upper arm level, right arm, initial encounter: Secondary | ICD-10-CM

## 2019-03-21 MED ORDER — CYCLOBENZAPRINE HCL 10 MG PO TABS: 10.0000 mg | ORAL_TABLET | Freq: Every day | ORAL | 0 refills | Status: DC

## 2019-03-21 MED ORDER — TRAMADOL HCL 50 MG PO TABS: ORAL_TABLET | ORAL | 0 refills | Status: DC

## 2019-03-21 MED ORDER — DICLOFENAC SODIUM 75 MG PO TBEC: 75.0000 mg | DELAYED_RELEASE_TABLET | Freq: Two times a day (BID) | ORAL | 0 refills | Status: DC

## 2019-03-21 NOTE — Progress Notes (Signed)
   Subjective:    Patient ID: Gail Perez, female    DOB: July 19, 1991, 28 y.o.   MRN: [id number]  HPI  Pt in with some rt trapezius area pain for about one week. But 2 days ago the pain got worse. Pt states before pain started she was moving some things at friends birthday party. Pt tried ibuprofen and alleve. Pt states ibuprofen 400 mg helped for about 2 hours.  LMP- has mirena.  Review of Systems  Constitutional: Negative for chills, fatigue and fever.  Respiratory: Negative for cough, chest tightness and shortness of breath.   Cardiovascular: Negative for chest pain and palpitations.  Gastrointestinal: Negative for abdominal pain.  Musculoskeletal:       Rt trapezius tenderness to palpation.  Skin: Negative for rash.  Neurological: Negative for dizziness, seizures, weakness and light-headedness.  Hematological: Negative for adenopathy. Does not bruise/bleed easily.  Psychiatric/Behavioral: Negative for behavioral problems.       Objective:   Physical Exam  General- No acute distress. Pleasant patient. Neck- Full range of motion, no jvd. Pain rt side trapezius on turning head to rt, left and putting chin to chest. But not rigid. Lungs- Clear, even and unlabored. Heart- regular rate and rhythm. Neurologic- CNII- XII grossly intact. Back- upper right side/trapezius tenderness.        Assessment & Plan:  For trapezius pain/strain, I rx diclofenac nsaid, flexeril muscle relaxant and tramadol for severe pain if needed. Also can try lidocaine otc patch daily.  Rx advisement given.  Follow up in 7-10 days or as needed  [employer]

## 2019-03-21 NOTE — Patient Instructions (Addendum)
For trapezius pain/strain, I rx diclofenac nsaid, flexeril muscle relaxant and tramadol for severe pain if needed. Also can try lidocaine otc patch daily.  Rx advisement given. No otc nsaids while on diclofenac.  Follow up in 7-10 days or as needed

## 2019-07-16 ENCOUNTER — Other Ambulatory Visit: Payer: Self-pay

## 2019-07-16 ENCOUNTER — Encounter: Payer: Self-pay | Admitting: Nurse Practitioner

## 2019-07-16 ENCOUNTER — Ambulatory Visit (INDEPENDENT_AMBULATORY_CARE_PROVIDER_SITE_OTHER): Payer: Self-pay | Admitting: Nurse Practitioner

## 2019-07-16 ENCOUNTER — Other Ambulatory Visit (HOSPITAL_COMMUNITY)
Admission: RE | Admit: 2019-07-16 | Discharge: 2019-07-16 | Disposition: A | Discharge status: Home / Self Care | Payer: Medicaid Other | Source: Ambulatory Visit | Attending: Nurse Practitioner | Admitting: Nurse Practitioner

## 2019-07-16 VITALS — BP 129/82 | HR 63 | Wt 197.0 lb

## 2019-07-16 DIAGNOSIS — N926 Irregular menstruation, unspecified: Secondary | ICD-10-CM

## 2019-07-16 DIAGNOSIS — Z975 Presence of (intrauterine) contraceptive device: Secondary | ICD-10-CM | POA: Insufficient documentation

## 2019-07-16 DIAGNOSIS — R1032 Left lower quadrant pain: Secondary | ICD-10-CM

## 2019-07-16 DIAGNOSIS — Z3202 Encounter for pregnancy test, result negative: Secondary | ICD-10-CM

## 2019-07-16 LAB — POCT URINE PREGNANCY: Preg Test, Ur: NEGATIVE

## 2019-07-16 LAB — POCT URINALYSIS DIPSTICK
Glucose, UA: NEGATIVE
Ketones, UA: NEGATIVE
Leukocytes, UA: NEGATIVE
Protein, UA: NEGATIVE
Spec Grav, UA: 1.01 (ref 1.010–1.025)
Urobilinogen, UA: 0.2 E.U./dL
pH, UA: 5.5 (ref 5.0–8.0)

## 2019-07-16 NOTE — Progress Notes (Signed)
GYNECOLOGY OFFICE VISIT NOTE   History:  28 y.o. [id number] here today for irregular bleeding.  Bleeds only when going to the bathroom.  Wears a pad but bleeding is never on the pad.  Has light pink bleeding for 2-3 days then one week of no bleeding and then 7-10 days of darker red blood only when she wipes.. She has started having left lower quadrant pain and is wondering if her IUD is in the right place.,  She takes 400 mg of ibuprofen for this pain but generally it does not help.  She has not noticed any association between the pain she feels and the timing of her bleeding.  Has had her IUD for one year and has always had some irregular bleeding.  She did take some medication after having the IUD for 3 months and the bleeding seemed to improve for awhile.  She denies any abnormal vaginal discharge or other concerns.   Past Medical History:  Diagnosis Date  . Allergy   . Chlamydia   . Hx of varicella   . Pregnancy induced hypertension 2014  . Seasonal allergies   . Urinary tract infection   . Vaginal Pap smear, abnormal     Past Surgical History:  Procedure Laterality Date  . LEEP      The following portions of the patient's history were reviewed and updated as appropriate: allergies, current medications, past family history, past medical history, past social history, past surgical history and problem list.   Health Maintenance:  Normal pap on 10-18-17.    Review of Systems:  Pertinent items noted in HPI and remainder of comprehensive ROS otherwise negative.  Objective:  Physical Exam BP 129/82   Pulse 63   Wt 197 lb (89.4 kg)   BMI 29.09 kg/m  CONSTITUTIONAL: Well-developed, well-nourished female in no acute distress.  HENT:  Normocephalic, atraumatic. External right and left ear normal.  EYES: Conjunctivae and EOM are normal. Pupils are equal, round.  No scleral icterus.  NECK: Normal range of motion, supple, no masses SKIN: Skin is warm and dry. No rash noted. Not  diaphoretic. No erythema. No pallor. NEUROLOGIC: Alert and oriented to person, place, and time. Normal muscle tone coordination. No cranial nerve deficit noted. PSYCHIATRIC: Normal mood and affect. Normal behavior. Normal judgment and thought content. ABDOMEN: Soft, no distention noted.   PELVIC: Normal appearing external genitalia; normal appearing vaginal mucosa and cervix.  No abnormal discharge noted.  Normal uterine size, no other palpable masses, no uterine or adnexal tenderness.  IUD strings visualized curling around cervic at approx 3 cm.  No part of IUD palpated on bimanual exam.small amount of pink blood noted in vagina. MUSCULOSKELETAL: Normal range of motion. No edema noted.  Labs and Imaging No results found.  Assessment & Plan:  1. Irregular bleeding Will check for infection for now.  If no infection found, will consider pelvic ultrasound due to new LLQ pain. Advised to use 400mg  or 600 mg of ibuprofen for LLQ pain and to take a second dose when it is time if the pain is not resolved.  - Cervicovaginal ancillary only( [place])  2. Presence of IUD IUD strings visualized curling around cervic at approx 3 cm   Routine preventative health maintenance measures emphasized. Please refer to After Visit Summary for other counseling recommendations.   Return if symptoms worsen or fail to improve.   Total face-to-face time with patient: 10 minutes.  Over 50% of encounter was spent on counseling and  coordination of care.  Nolene Bernheim, RN, MSN, NP-BC Nurse Practitioner, [facility] for [employer], [facility] 07/16/2019 8:23 AM

## 2019-07-16 NOTE — Progress Notes (Signed)
Patient has had her IUD for one year. Patient is still having irregular bleeding and wanted the strings check. Patient also complaining of some left sided abdominal  pain. Kathrene Alu RN

## 2019-07-17 LAB — CERVICOVAGINAL ANCILLARY ONLY
Bacterial Vaginitis (gardnerella): NEGATIVE
Candida Glabrata: NEGATIVE
Candida Vaginitis: NEGATIVE
Chlamydia: NEGATIVE
Comment: NEGATIVE
Comment: NEGATIVE
Comment: NEGATIVE
Comment: NEGATIVE
Comment: NEGATIVE
Comment: NORMAL
Neisseria Gonorrhea: NEGATIVE
Trichomonas: NEGATIVE

## 2021-05-27 ENCOUNTER — Ambulatory Visit: Payer: 59 | Admitting: Podiatry

## 2021-08-02 ENCOUNTER — Emergency Department: Payer: 59

## 2021-08-02 ENCOUNTER — Other Ambulatory Visit: Payer: Self-pay

## 2021-08-02 ENCOUNTER — Encounter: Payer: Self-pay | Admitting: Emergency Medicine

## 2021-08-02 ENCOUNTER — Emergency Department
Admission: EM | Admit: 2021-08-02 | Discharge: 2021-08-02 | Disposition: A | Discharge status: Home / Self Care | Payer: 59 | Attending: Emergency Medicine | Admitting: Emergency Medicine

## 2021-08-02 DIAGNOSIS — R1030 Lower abdominal pain, unspecified: Secondary | ICD-10-CM | POA: Diagnosis not present

## 2021-08-02 DIAGNOSIS — Z3A01 Less than 8 weeks gestation of pregnancy: Secondary | ICD-10-CM | POA: Insufficient documentation

## 2021-08-02 DIAGNOSIS — O26891 Other specified pregnancy related conditions, first trimester: Secondary | ICD-10-CM | POA: Insufficient documentation

## 2021-08-02 DIAGNOSIS — M545 Low back pain, unspecified: Secondary | ICD-10-CM | POA: Insufficient documentation

## 2021-08-02 DIAGNOSIS — O4690 Antepartum hemorrhage, unspecified, unspecified trimester: Secondary | ICD-10-CM

## 2021-08-02 DIAGNOSIS — Z3491 Encounter for supervision of normal pregnancy, unspecified, first trimester: Secondary | ICD-10-CM

## 2021-08-02 LAB — URINALYSIS, ROUTINE W REFLEX MICROSCOPIC
Bilirubin Urine: NEGATIVE
Glucose, UA: NEGATIVE mg/dL
Ketones, ur: NEGATIVE mg/dL
Nitrite: NEGATIVE
Protein, ur: NEGATIVE mg/dL
Specific Gravity, Urine: 1.019 (ref 1.005–1.030)
pH: 5 (ref 5.0–8.0)

## 2021-08-02 LAB — CBC WITH DIFFERENTIAL/PLATELET
Abs Immature Granulocytes: 0.02 10*3/uL (ref 0.00–0.07)
Basophils Absolute: 0.1 10*3/uL (ref 0.0–0.1)
Basophils Relative: 1 %
Eosinophils Absolute: 0.2 10*3/uL (ref 0.0–0.5)
Eosinophils Relative: 4 %
HCT: 40.2 % (ref 36.0–46.0)
Hemoglobin: 12.9 g/dL (ref 12.0–15.0)
Immature Granulocytes: 0 %
Lymphocytes Relative: 28 %
Lymphs Abs: 1.7 10*3/uL (ref 0.7–4.0)
MCH: 27.6 pg (ref 26.0–34.0)
MCHC: 32.1 g/dL (ref 30.0–36.0)
MCV: 86.1 fL (ref 80.0–100.0)
Monocytes Absolute: 0.5 10*3/uL (ref 0.1–1.0)
Monocytes Relative: 8 %
Neutro Abs: 3.8 10*3/uL (ref 1.7–7.7)
Neutrophils Relative %: 59 %
Platelets: 346 10*3/uL (ref 150–400)
RBC: 4.67 MIL/uL (ref 3.87–5.11)
RDW: 13.2 % (ref 11.5–15.5)
WBC: 6.3 10*3/uL (ref 4.0–10.5)
nRBC: 0 % (ref 0.0–0.2)

## 2021-08-02 LAB — BASIC METABOLIC PANEL
Anion gap: 6 (ref 5–15)
BUN: 8 mg/dL (ref 6–20)
CO2: 24 mmol/L (ref 22–32)
Calcium: 9.1 mg/dL (ref 8.9–10.3)
Chloride: 106 mmol/L (ref 98–111)
Creatinine, Ser: 0.9 mg/dL (ref 0.44–1.00)
GFR, Estimated: >60 mL/min (ref >60)
Glucose, Bld: 100 mg/dL — ABNORMAL HIGH (ref 70–99)
Potassium: 4.1 mmol/L (ref 3.5–5.1)
Sodium: 136 mmol/L (ref 135–145)

## 2021-08-02 LAB — ABO/RH: ABO/RH(D): O POS

## 2021-08-02 LAB — HCG, QUANTITATIVE, PREGNANCY: hCG, Beta Chain, Quant, S: 1014 m[IU]/mL — ABNORMAL HIGH (ref <5)

## 2021-08-02 LAB — POC URINE PREG, ED: Preg Test, Ur: POSITIVE — AB

## 2021-08-02 NOTE — ED Triage Notes (Signed)
C/O lower abdominal cramping x 1 week, more severe today  also c/o vaginal spotting today and low back pain.  States she is [redacted] weeks pregnant.

## 2021-08-02 NOTE — Discharge Instructions (Addendum)
Call [facility] to see if they will be able to repeat your blood work in 2 days to see if your beta is elevated.  Results today was 1014.  Also a ultrasound is recommended in 7 to 10 days and can be done at her OB/GYN's office.  If any severe worsening of your symptoms such as abdominal pain or increased bleeding where you are going through more than 1 pad per hour completely saturated return to the emergency department.

## 2021-08-02 NOTE — ED Provider Notes (Signed)
[facility] Emergency Department Provider Note   ____________________________________________   Event Date/Time   First MD Initiated Contact with Patient 08/02/21 1000     (approximate)  I have reviewed the triage vital signs and the nursing notes.   HISTORY  Chief Complaint Abdominal Pain   HPI Gail Perez is a 30 y.o. female presents to the ED with complaint of lower abdominal cramping for 1 week and vaginal spotting that began today only.  Patient states that currently she has a light panty liner on which has not been filled with blood.  She also complains of some low back pain.  Patient estimates that she is approximately [redacted] weeks pregnant at this time.         Past Medical History:  Diagnosis Date   Allergy    Chlamydia    Hx of varicella    Pregnancy induced hypertension 2014   Seasonal allergies    Urinary tract infection    Vaginal Pap smear, abnormal     Patient Active Problem List   Diagnosis Date Noted   Presence of IUD 07/16/2019   H/O LEEP 11/23/2015    Past Surgical History:  Procedure Laterality Date   LEEP      Prior to Admission medications   Not on File    Allergies Patient has no known allergies.  Family History  Problem Relation Age of Onset   Hypertension Father    Hyperlipidemia Father    Heart disease Father        enlatged heart   Sarcoidosis Father    Anemia Mother    Cancer Maternal Grandfather        lung   Hypertension Sister    Other Neg Hx     Social History Social History   Tobacco Use   Smoking status: Never   Smokeless tobacco: Never  Vaping Use   Vaping Use: Never used  Substance Use Topics   Alcohol use: No    Comment: occasional   Drug use: No    Review of Systems Constitutional: No fever/chills Eyes: No visual changes. ENT: No sore throat. Cardiovascular: Denies chest pain. Respiratory: Denies shortness of breath. Gastrointestinal: Positive for abdominal cramping.  No  nausea, no vomiting.  No diarrhea.   Genitourinary: Negative for dysuria.  Positive vaginal bleeding.  Positive pregnancy. Musculoskeletal: Negative for back pain. Skin: Negative for rash. Neurological: Negative for headaches, focal weakness or numbness.  ____________________________________________   PHYSICAL EXAM:  VITAL SIGNS: ED Triage Vitals  Enc Vitals Group     BP 08/02/21 0848 140/86     Pulse Rate 08/02/21 0848 68     Resp 08/02/21 0848 18     Temp 08/02/21 0848 98.3 F (36.8 C)     Temp Source 08/02/21 0848 Oral     SpO2 08/02/21 0848 100 %     Weight 08/02/21 0838 197 lb 1.5 oz (89.4 kg)     Height 08/02/21 0838 5\' 9"  (1.753 m)     Head Circumference --      Peak Flow --      Pain Score 08/02/21 0838 4     Pain Loc --      Pain Edu? --      Excl. in GC? --    Constitutional: Alert and oriented. Well appearing and in no acute distress. Eyes: Conjunctivae are normal.  Head: Atraumatic. Neck: No stridor.   Cardiovascular: Normal rate, regular rhythm. Grossly normal heart sounds.  Good peripheral  circulation. Respiratory: Normal respiratory effort.  No retractions. Lungs CTAB. Gastrointestinal: Soft and nontender. No distention.  Bowel sounds normoactive x4 quadrants. Musculoskeletal: Moves upper and lower extremities without any difficulty.  Normal gait was noted. Neurologic:  Normal speech and language. No gross focal neurologic deficits are appreciated. No gait instability. Skin:  Skin is warm, dry and intact. No rash noted. Psychiatric: Mood and affect are normal. Speech and behavior are normal.  ____________________________________________   LABS (all labs ordered are listed, but only abnormal results are displayed)  Labs Reviewed  BASIC METABOLIC PANEL - Abnormal; Notable for the following components:      Result Value   Glucose, Bld 100 (*)    All other components within normal limits  HCG, QUANTITATIVE, PREGNANCY - Abnormal; Notable for the following  components:   hCG, Beta Chain, Quant, S 1,014 (*)    All other components within normal limits  URINALYSIS, ROUTINE W REFLEX MICROSCOPIC - Abnormal; Notable for the following components:   Color, Urine YELLOW (*)    APPearance HAZY (*)    Hgb urine dipstick MODERATE (*)    Leukocytes,Ua TRACE (*)    Bacteria, UA RARE (*)    All other components within normal limits  POC URINE PREG, ED - Abnormal; Notable for the following components:   Preg Test, Ur POSITIVE (*)    All other components within normal limits  URINE CULTURE  CBC WITH DIFFERENTIAL/PLATELET  ABO/RH   ____________________________________________  RADIOLOGY Beaulah Corin, personally viewed and evaluated these images (plain radiographs) as part of my medical decision making, as well as reviewing the written report by the radiologist.   Official radiology report(s): US OB LESS THAN 14 WEEKS WITH OB TRANSVAGINAL  Result Date: 08/02/2021 CLINICAL DATA:  Vaginal bleeding for a day. EXAM: OBSTETRIC <14 WK [place] AND TRANSVAGINAL OB US TECHNIQUE: Both transabdominal and transvaginal ultrasound examinations were performed for complete evaluation of the gestation as well as the maternal uterus, adnexal regions, and pelvic cul-de-sac. Transvaginal technique was performed to assess early pregnancy. COMPARISON:  None. FINDINGS: Intrauterine gestational sac: Absent. Yolk sac:  Absent. Embryo:  Absent. Cardiac Activity: Not applicable. Subchorionic hemorrhage:  Not applicable. Maternal uterus/adnexae: Ovaries are visualized.  No free fluid. IMPRESSION: No intrauterine gestational sac.  Early IUP is not excluded. Electronically Signed   By: Leanna Battles M.D.   On: 08/02/2021 11:20    ____________________________________________   PROCEDURES  Procedure(s) performed (including Critical Care):  Procedures   ____________________________________________   INITIAL IMPRESSION / ASSESSMENT AND PLAN / ED COURSE  As part of my  medical decision making, I reviewed the following data within the electronic MEDICAL RECORD NUMBER Notes from prior ED visits and [place] Controlled Substance Database  30 year old female presents to the ED with complaint of light vaginal spotting in suspicion of pregnancy which has not been confirmed by her doctor at this time.  Patient also has had a crampy sensation for the past week.  Beta hCG was 1014 with ABO/Rh O+.  Ultrasound did not show a IUP, yolk sac or embryo.  No evidence of ectopic pregnancy or free fluid.  This was discussed with patient and still does not rule out an early pregnancy.  Patient was told that a repeat beta-hCG should be done in 2 days to make sure that there is elevation.  Also a repeat ultrasound in 7 to 10 days.  She is to return to the emergency department if any severe worsening of her symptoms or urgent  concerns.  Patient states that she already has an appointment with [facility] and will call there first to see if they are able to do repeat blood work in 2 days otherwise she will return to the emergency department for the blood work.   ____________________________________________   FINAL CLINICAL IMPRESSION(S) / ED DIAGNOSES  Final diagnoses:  First trimester pregnancy     ED Discharge Orders     None        Note:  This document was prepared using Dragon voice recognition software and may include unintentional dictation errors.    Tommi Rumps, PA-C 08/02/21 1357    Delton Prairie, MD 08/02/21 1524

## 2021-08-02 NOTE — ED Notes (Signed)
Pt to ED c/o light vaginal spotting (pink then dark red) this morning and lower abdominal cramping since last week. Pt believes she is about [redacted] weeks pregnant, per LNMP. Pt has upcoming OB appt on 09/08/21. Accompanied by husband and 32 y/o daughter.

## 2021-08-03 ENCOUNTER — Encounter: Payer: Self-pay | Admitting: Obstetrics and Gynecology

## 2021-08-03 ENCOUNTER — Ambulatory Visit (INDEPENDENT_AMBULATORY_CARE_PROVIDER_SITE_OTHER): Payer: 59 | Admitting: Obstetrics and Gynecology

## 2021-08-03 VITALS — BP 131/85 | HR 73 | Ht 69.0 in | Wt 202.8 lb

## 2021-08-03 DIAGNOSIS — Z3200 Encounter for pregnancy test, result unknown: Secondary | ICD-10-CM

## 2021-08-03 DIAGNOSIS — Z7689 Persons encountering health services in other specified circumstances: Secondary | ICD-10-CM

## 2021-08-03 LAB — POCT URINE PREGNANCY: Preg Test, Ur: POSITIVE — AB

## 2021-08-03 NOTE — Progress Notes (Signed)
HPI:      Ms. Gail Perez is a 30 y.o. [phone] who LMP was Patient's last menstrual period was 06/27/2021.  Subjective:   She presents today because she has missed a menstrual period.  She believes she is pregnant.  She had some pelvic cramping and some spotting and went to the ED.  She was found to have a positive pregnancy test there.  Her ultrasound revealed no evidence of intrauterine or extrauterine pregnancy. Based on LMP she is approximately 5 weeks estimated gestational age. She is not having significant pelvic cramping or significant bleeding at this time. She has had 3 prior pregnancies which were uncomplicated vaginal births.    Hx: The following portions of the patient's history were reviewed and updated as appropriate:             She  has a past medical history of Allergy, Chlamydia, varicella, Pregnancy induced hypertension (2014), Seasonal allergies, Urinary tract infection, and Vaginal Pap smear, abnormal. She does not have any pertinent problems on file. She  has a past surgical history that includes LEEP. Her family history includes Anemia in her mother; Cancer in her maternal grandfather; Heart disease in her father; Hyperlipidemia in her father; Hypertension in her father and sister; Sarcoidosis in her father. She  reports that she has never smoked. She has never used smokeless tobacco. She reports that she does not drink alcohol and does not use drugs. She currently has no medications in their medication list. She has No Known Allergies.       Review of Systems:  Review of Systems  Constitutional: Denied constitutional symptoms, night sweats, recent illness, fatigue, fever, insomnia and weight loss.  Eyes: Denied eye symptoms, eye pain, photophobia, vision change and visual disturbance.  Ears/Nose/Throat/Neck: Denied ear, nose, throat or neck symptoms, hearing loss, nasal discharge, sinus congestion and sore throat.  Cardiovascular: Denied cardiovascular symptoms,  arrhythmia, chest pain/pressure, edema, exercise intolerance, orthopnea and palpitations.  Respiratory: Denied pulmonary symptoms, asthma, pleuritic pain, productive sputum, cough, dyspnea and wheezing.  Gastrointestinal: Denied, gastro-esophageal reflux, melena, nausea and vomiting.  Genitourinary: See HPI for additional information.  Musculoskeletal: Denied musculoskeletal symptoms, stiffness, swelling, muscle weakness and myalgia.  Dermatologic: Denied dermatology symptoms, rash and scar.  Neurologic: Denied neurology symptoms, dizziness, headache, neck pain and syncope.  Psychiatric: Denied psychiatric symptoms, anxiety and depression.  Endocrine: Denied endocrine symptoms including hot flashes and night sweats.   Meds:   No current outpatient medications on file prior to visit.   No current facility-administered medications on file prior to visit.      Objective:     Vitals:   08/03/21 0950  BP: 131/85  Pulse: 73   Filed Weights   08/03/21 0950  Weight: 202 lb 12.8 oz (92 kg)              ED results reviewed          Assessment:    [id number] Patient Active Problem List   Diagnosis Date Noted   Presence of IUD 07/16/2019   H/O LEEP 11/23/2015     1. Possible pregnancy, not yet confirmed     Approximately 5 weeks estimated gestational age based on LMP.  Early cramping and spotting likely consistent with normal intrauterine pregnancy.   Plan:            Prenatal Plan 1.  The patient was given prenatal literature. 2.  She was begun on prenatal vitamins. 3.  A prenatal lab panel to be drawn  at nurse visit. 4.  An ultrasound was ordered to better determine an EDC. 5.  A nurse visit was scheduled. 6.  Genetic testing and testing for other inheritable conditions discussed in detail. She will decide in the future whether to have these labs performed. 7.  A general overview of pregnancy testing, visit schedule, ultrasound schedule, and prenatal care was  discussed. 8.  Miscarriage discussed in detail.  Patient to inform us if she has worsening bleeding or cramping. 9.  Follow-up ultrasound with expectation of intrauterine pregnancy showing CRL and fetal heart tones.   Orders Orders Placed This Encounter  Procedures   POCT urine pregnancy    No orders of the defined types were placed in this encounter.     F/U  Return in about 7 weeks (around 09/21/2021). I spent 31 minutes involved in the care of this patient preparing to see the patient by obtaining and reviewing her medical history (including labs, imaging tests and prior procedures), documenting clinical information in the electronic health record (EHR), counseling and coordinating care plans, writing and sending prescriptions, ordering tests or procedures and in direct communicating with the patient and medical staff discussing pertinent items from her history and physical exam.  Gail Perez, M.D. 08/03/2021 10:38 AM

## 2021-08-05 ENCOUNTER — Encounter: Payer: Self-pay | Admitting: Obstetrics and Gynecology

## 2021-08-05 NOTE — Telephone Encounter (Signed)
Pt is calling in stating that she is very concerned about her bleeding heavily now and want to know what she should do.  Pt would like to have a call back.

## 2021-08-06 ENCOUNTER — Other Ambulatory Visit: Payer: BC Managed Care – PPO

## 2021-08-06 ENCOUNTER — Other Ambulatory Visit: Payer: Self-pay

## 2021-08-06 DIAGNOSIS — Z9189 Other specified personal risk factors, not elsewhere classified: Secondary | ICD-10-CM

## 2021-08-06 LAB — URINE CULTURE

## 2021-08-07 LAB — BETA HCG QUANT (REF LAB): hCG Quant: 83 m[IU]/mL

## 2021-08-12 ENCOUNTER — Telehealth: Payer: Self-pay | Admitting: Obstetrics and Gynecology

## 2021-08-12 NOTE — Telephone Encounter (Signed)
Pt is calling in stating that she would like to have a call about her lab results from 08/06/2021.  Pt is aware that someone from the office will give her a call back to let her know once the provider has resulted them.

## 2021-08-19 ENCOUNTER — Encounter: Payer: Self-pay | Admitting: Internal Medicine

## 2021-08-19 ENCOUNTER — Other Ambulatory Visit: Payer: Self-pay

## 2021-08-19 ENCOUNTER — Ambulatory Visit: Payer: 59 | Admitting: Internal Medicine

## 2021-08-19 VITALS — BP 119/78 | HR 78 | Temp 97.7°F | Resp 18 | Ht 69.0 in | Wt 200.0 lb

## 2021-08-19 DIAGNOSIS — O039 Complete or unspecified spontaneous abortion without complication: Secondary | ICD-10-CM | POA: Diagnosis not present

## 2021-08-19 DIAGNOSIS — E663 Overweight: Secondary | ICD-10-CM

## 2021-08-19 DIAGNOSIS — M549 Dorsalgia, unspecified: Secondary | ICD-10-CM | POA: Diagnosis not present

## 2021-08-19 DIAGNOSIS — N62 Hypertrophy of breast: Secondary | ICD-10-CM

## 2021-08-19 DIAGNOSIS — M25512 Pain in left shoulder: Secondary | ICD-10-CM

## 2021-08-19 DIAGNOSIS — G8929 Other chronic pain: Secondary | ICD-10-CM

## 2021-08-19 DIAGNOSIS — M25511 Pain in right shoulder: Secondary | ICD-10-CM | POA: Diagnosis not present

## 2021-08-19 DIAGNOSIS — Z6829 Body mass index (BMI) 29.0-29.9, adult: Secondary | ICD-10-CM

## 2021-08-19 NOTE — Progress Notes (Signed)
HPI  Pt presents to the clinic today to establish care.  She is having some vaginal bleeding and cramping. She reports this started 12/12, stopped on 12/17. She had a positive pregnancy test, OB scheduled appt for 1/3 for repeat Hcg and ultrasound. She is concerned that she has miscarried. She was seen 12/12 in the ER for the same.  Ultrasound at that time did not show a gestational sac.  She followed up with OB on 12/13.  Her hCG levels are trending down.  She is G4 P3, currently not on contraception.  She does not desire additional pregnancy.  She also reports bilateral shoulder and upper back pain.  She reports this started 2 years ago.  She describes the pain as sore and achy, worse at the end of the day.  She contributes this to her large breasts, currently wearing a 53F.  She would like a referral to a surgeon to discuss a breast reduction.   Past Medical History:  Diagnosis Date   Allergy    Chlamydia    Hx of varicella    Pregnancy induced hypertension 2014   Seasonal allergies    Urinary tract infection    Vaginal Pap smear, abnormal     Current Outpatient Medications  Medication Sig Dispense Refill   acetaminophen (TYLENOL) 500 MG tablet Take 500 mg by mouth every 6 (six) hours as needed.     No current facility-administered medications for this visit.    No Known Allergies  Family History  Problem Relation Age of Onset   Hypertension Father    Hyperlipidemia Father    Heart disease Father        enlatged heart   Sarcoidosis Father    Anemia Mother    Cancer Maternal Grandfather        lung   Hypertension Sister    Other Neg Hx     Social History   Socioeconomic History   Marital status: Married    Spouse name: Not on file   Number of children: Not on file   Years of education: Not on file   Highest education level: Not on file  Occupational History   Not on file  Tobacco Use   Smoking status: Never   Smokeless tobacco: Never  Vaping Use   Vaping  Use: Never used  Substance and Sexual Activity   Alcohol use: Yes    Comment: occasional   Drug use: No   Sexual activity: Yes    Birth control/protection: Condom  Other Topics Concern   Not on file  Social History Narrative   Not on file   Social Determinants of Health   Financial Resource Strain: Not on file  Food Insecurity: Not on file  Transportation Needs: Not on file  Physical Activity: Not on file  Stress: Not on file  Social Connections: Not on file  Intimate Partner Violence: Not on file    ROS:  Constitutional: Denies fever, malaise, fatigue, headache or abrupt weight changes.  HEENT: Denies eye pain, eye redness, ear pain, ringing in the ears, wax buildup, runny nose, nasal congestion, bloody nose, or sore throat. Respiratory: Denies difficulty breathing, shortness of breath, cough or sputum production.   Cardiovascular: Denies chest pain, chest tightness, palpitations or swelling in the hands or feet.  Gastrointestinal: Denies abdominal pain, bloating, constipation, diarrhea or blood in the stool.  GU: Denies frequency, urgency, pain with urination, blood in urine, odor or discharge. Musculoskeletal: Patient reports bilateral shoulder and upper back pain.  Denies decrease in range of motion, difficulty with gait, or joint swelling.  Skin: Denies redness, rashes, lesions or ulcercations.  Neurological: Denies dizziness, difficulty with memory, difficulty with speech or problems with balance and coordination.  Psych: Denies anxiety, depression, SI/HI.  No other specific complaints in a complete review of systems (except as listed in HPI above).  PE:  BP 119/78 (BP Location: Right Arm, Patient Position: Sitting, Cuff Size: Normal)    Pulse 78    Temp 97.7 F (36.5 C) (Temporal)    Resp 18    Ht 5\' 9"  (1.753 m)    Wt 200 lb (90.7 kg)    LMP 06/27/2021 Comment: G4 P3   SpO2 100%    BMI 29.53 kg/m  Wt Readings from Last 3 Encounters:  08/19/21 200 lb (90.7 kg)   08/03/21 202 lb 12.8 oz (92 kg)  08/02/21 197 lb 1.5 oz (89.4 kg)    General: Appears her stated age, overweight, in NAD. Neck: Neck supple, trachea midline. No masses, lumps or thyromegaly present.  Cardiovascular: Normal rate and rhythm. S1,S2 noted.  No murmur, rubs or gallops noted.  Pulmonary/Chest: Normal effort and positive vesicular breath sounds. No respiratory distress. No wheezes, rales or ronchi noted.  Abdomen: Normal bowel sound. Musculoskeletal: Normal internal and external rotation of the shoulders.  No bony tenderness noted over the spine but she does have pain with palpation in the trapezius area.  She has incontinence on both of her shoulders from her bra strap.  Strength 5/5 BUE. Neurological: Alert and oriented. Psychiatric: Mood and affect normal. Behavior is normal. Judgment and thought content normal.     BMET    Component Value Date/Time   NA 136 08/02/2021 0847   K 4.1 08/02/2021 0847   CL 106 08/02/2021 0847   CO2 24 08/02/2021 0847   GLUCOSE 100 (H) 08/02/2021 0847   BUN 8 08/02/2021 0847   CREATININE 0.90 08/02/2021 0847   CREATININE 0.70 01/19/2016 0001   CALCIUM 9.1 08/02/2021 0847   GFRNONAA >60 08/02/2021 0847   GFRAA >60 12/05/2017 1333    Lipid Panel  No results found for: CHOL, TRIG, HDL, CHOLHDL, VLDL, LDLCALC  CBC    Component Value Date/Time   WBC 6.3 08/02/2021 0847   RBC 4.67 08/02/2021 0847   HGB 12.9 08/02/2021 0847   HGB 11.3 02/13/2018 0000   HCT 40.2 08/02/2021 0847   HCT 34.1 02/13/2018 0000   PLT 346 08/02/2021 0847   PLT 290 02/13/2018 0000   MCV 86.1 08/02/2021 0847   MCV 81 02/13/2018 0000   MCH 27.6 08/02/2021 0847   MCHC 32.1 08/02/2021 0847   RDW 13.2 08/02/2021 0847   RDW 14.8 02/13/2018 0000   LYMPHSABS 1.7 08/02/2021 0847   LYMPHSABS 2.2 10/18/2017 1617   MONOABS 0.5 08/02/2021 0847   EOSABS 0.2 08/02/2021 0847   EOSABS 0.2 10/18/2017 1617   BASOSABS 0.1 08/02/2021 0847   BASOSABS 0.1 10/18/2017 1617     Hgb A1C No results found for: HGBA1C   Assessment and Plan:  Bilateral Shoulder Pain, Upper Back Pain secondary to Large Breasts:  Referral to general surgery for possible breast reduction  ER follow-up for Vaginal Bleeding in Pregnancy:  ER and OB notes, labs and imaging reviewed It does appear that she has miscarried She has a follow-up with OB 1/3 She is currently asymptomatic at this time Advised her to discuss contraceptive management with OB if she does not desire any further pregnancies  RTC in  1 year, sooner if needed Nicki Reaper, NP This visit occurred during the SARS-CoV-2 public health emergency.  Safety protocols were in place, including screening questions prior to the visit, additional usage of staff PPE, and extensive cleaning of exam room while observing appropriate contact time as indicated for disinfecting solutions.

## 2021-08-19 NOTE — Patient Instructions (Signed)
Miscarriage °A miscarriage is the loss of a pregnancy before the 20th week of pregnancy. Sometimes, a pregnancy ends before a woman knows that she is pregnant. °If you lose a pregnancy, talk with your doctor about: °Questions you have about the loss of your baby. °How to work through your grief. °Plans for future pregnancy. °What are the causes? °Many times, the cause of this condition is not known. °What increases the risk? °These things may make a pregnant woman more likely to lose a pregnancy: °Certain health conditions °Conditions that affect hormones, such as: °Thyroid disease. °Polycystic ovary syndrome. °Diabetes. °A disease that causes the body's disease-fighting system to attack itself by mistake. °Infections. °Bleeding problems. °Being very overweight. °Lifestyle factors °Using products that have tobacco or nicotine in them. °Being around tobacco smoke. °Having alcohol. °Having a lot of caffeine. °Using drugs. °Problems with reproductive organs or parts °Having a cervix that opens and thins before your due date. The cervix is the lowest part of your womb. °Having Asherman syndrome, which leads to: °Scars in the womb. °The womb being abnormal in shape. °Growths (fibroids) in the womb. °Problems in the body that are present at birth. °Infection of the cervix or womb. °Personal or health history °Injury. °Having lost a pregnancy before. °Being younger than age 18 or older than age 35. °Being around a harmful substance, such as radiation. °Having lead or other heavy metals in: °Things you eat or drink. °The air around you. °Using certain medicines. °What are the signs or symptoms? °Blood or spots of blood coming from the vagina. You may also have cramps or pain. °Pain or cramps in the belly or low back. °Fluid or tissue coming out of the vagina. °How is this treated? °Sometimes, treatment is not needed. °If you need treatment, you may be treated with: °A procedure to open the cervix more and take tissue out of  the womb. °Medicines. You may get a shot of medicine called Rho(D) immune globulin. °Follow these instructions at home: °Medicines °Take over-the-counter and prescription medicines only as told by your doctor. °If you were prescribed antibiotic medicine, take it as told by your doctor. Do not stop taking it even if you start to feel better. °Activity °Rest as told by your doctor. Ask your doctor what activities are safe for you. °Have someone help you at home during this time. °General instructions ° °Watch how much tissue comes out of the vagina. °Watch the size of any blood clots that come out of the vagina. °Do not have sex or douche until your doctor says it is okay. °Do not put things, such as tampons, in your vagina until your doctor says it is okay. °To help you and your partner with grieving: °Talk with your doctor. °See a counselor. °When you are ready, talk with your doctor about: °Things to do for your health. °How you can be healthy if you get pregnant again. °Keep all follow-up visits. °Where to find more information °The American College of Obstetricians and Gynecologists: acog.org °[place] Department of Health and Human Services Office of Women's Health: hrsa.gov/office-womens-health °Contact a doctor if: °You have a fever or chills. °There is bad-smelling fluid coming from the vagina. °You have more bleeding. °Tissue or clots of blood come out of your vagina. °Get help right away if: °You have very bad cramps or pain in your back or belly. °You soak more than 2 large pads in an hour for more than 2 hours. °You get light-headed or weak. °You faint. °  You feel sad, and you have sad thoughts a lot of the time. You think about hurting yourself. Get help right awayif you feel like you may hurt yourself or others, or have thoughts about taking your own life. Go to your nearest emergency room or: Call your local emergency services (911 in the [place] Call the National Suicide Prevention Lifeline at  772-633-8369 or 988 in the [place] This is open 24 hours a day. Text the Crisis Text Line at (585) 615-4421. Summary A miscarriage is the loss of a pregnancy before the 20th week of pregnancy. Sometimes, a pregnancy ends before a woman knows that she is pregnant. Follow instructions from your doctor about medicines and activity. To help you and your partner with grieving, talk with your doctor or a counselor. Keep all follow-up visits. This information is not intended to replace advice given to you by your health care provider. Make sure you discuss any questions you have with your health care provider. Document Revised: 03/03/2021 Document Reviewed: 02/07/2020 Elsevier Patient Education  2022 [employer].

## 2021-08-19 NOTE — Assessment & Plan Note (Signed)
Encourage diet and exercise for weight loss 

## 2021-08-24 ENCOUNTER — Other Ambulatory Visit: Payer: Self-pay | Admitting: Obstetrics and Gynecology

## 2021-08-24 ENCOUNTER — Other Ambulatory Visit (INDEPENDENT_AMBULATORY_CARE_PROVIDER_SITE_OTHER): Payer: 59

## 2021-08-24 ENCOUNTER — Other Ambulatory Visit: Payer: Self-pay

## 2021-08-24 DIAGNOSIS — O200 Threatened abortion: Secondary | ICD-10-CM

## 2021-08-30 NOTE — Telephone Encounter (Signed)
Patient had an appointment on 08/24/2021.

## 2021-09-08 ENCOUNTER — Encounter: Payer: Self-pay | Admitting: Obstetrics and Gynecology

## 2021-09-15 ENCOUNTER — Encounter: Payer: Self-pay | Admitting: Internal Medicine

## 2021-09-15 DIAGNOSIS — N62 Hypertrophy of breast: Secondary | ICD-10-CM

## 2021-09-15 DIAGNOSIS — M549 Dorsalgia, unspecified: Secondary | ICD-10-CM

## 2021-09-21 ENCOUNTER — Encounter: Payer: BC Managed Care – PPO | Admitting: Obstetrics and Gynecology

## 2021-09-21 ENCOUNTER — Encounter: Payer: Self-pay | Admitting: Internal Medicine

## 2021-09-24 ENCOUNTER — Ambulatory Visit: Payer: 59 | Admitting: Internal Medicine

## 2021-09-24 ENCOUNTER — Ambulatory Visit (INDEPENDENT_AMBULATORY_CARE_PROVIDER_SITE_OTHER): Payer: BC Managed Care – PPO | Admitting: Plastic Surgery

## 2021-09-24 ENCOUNTER — Encounter: Payer: Self-pay | Admitting: Internal Medicine

## 2021-09-24 ENCOUNTER — Other Ambulatory Visit: Payer: Self-pay

## 2021-09-24 VITALS — BP 128/75 | HR 71 | Ht 69.0 in | Wt 199.8 lb

## 2021-09-24 VITALS — BP 120/71 | HR 80 | Temp 97.3°F | Wt 201.0 lb

## 2021-09-24 DIAGNOSIS — N62 Hypertrophy of breast: Secondary | ICD-10-CM | POA: Diagnosis not present

## 2021-09-24 DIAGNOSIS — G8929 Other chronic pain: Secondary | ICD-10-CM | POA: Diagnosis not present

## 2021-09-24 DIAGNOSIS — F419 Anxiety disorder, unspecified: Secondary | ICD-10-CM

## 2021-09-24 DIAGNOSIS — Z636 Dependent relative needing care at home: Secondary | ICD-10-CM

## 2021-09-24 DIAGNOSIS — M546 Pain in thoracic spine: Secondary | ICD-10-CM | POA: Diagnosis not present

## 2021-09-24 DIAGNOSIS — Z0289 Encounter for other administrative examinations: Secondary | ICD-10-CM

## 2021-09-24 NOTE — Patient Instructions (Signed)
Supporting Someone After a Stroke °Caregivers provide essential physical and emotional support to people who have had a stroke. They play an important role in coordinating schedules, helping a loved one to communicate, and helping with the overall rehabilitation plan. Before the person you are caring for leaves the hospital, make sure you understand how to care for someone who has had a stroke. °Caregivers also need to make sure they are taking care of their own well-being during this time. This is important so that caregivers are able to continue with ongoing responsibilities. °Seeking Information °Recovering from a stroke can take weeks, months, or years. Some people may be able to return to a normal lifestyle, and other people may have permanent problems with movement (mobility), thinking, behavior, or communication (cognitive ability). Your loved one's health care team may rely on you to provide information, such as medical history and current medicines. Understanding your loved one's condition can help you manage the role of caregiver. °Managing stress °Stepping into a caregiver role after a loved one's stroke can feel overwhelming. Find ways to manage stress. These may include: °Doing deep breathing, yoga, or meditation. °Spending time outdoors. °Writing in a journal. °Follow these instructions at home: °Lifestyle °Rest. Try to get 7-9 hours of uninterrupted sleep each night. °Eat a balanced diet that includes fresh fruits and vegetables, whole grains, lean proteins, and low-fat dairy. °Exercise for 30 minutes or more on 5 or more days each week. °If you drink alcohol: °Limit how much you use to: °0-1 drink a day for women who are not pregnant. °0-2 drinks a day for men. °Know how much alcohol is in your drink. In the [place] one drink equals one 12 oz bottle of beer (355 mL), one 5 oz glass of wine (148 mL), or one 1½ oz glass of hard liquor (44 mL). °Safety °Follow instructions to help prevent your loved one  from falls in his or her home. These may include: °Installing grab bars in bathrooms and handrails for stairways. °Using night-lights in the bedroom, bathroom, and hallways. °Removing rugs and mats or making them stick to the floor. °Keeping walkways clear by removing cords and clutter from the floor. °Where to find support °Talking to others °It is important to know that you are not alone. Consider doing the following to help you cope: °Ask for help. Take a break if you are the primary caregiver to your loved one. °Spend time with supportive people. °Join a support group with other caregivers or family members of people who have had a stroke. °Seek counseling from a mental health professional if you feel depressed or anxious, or if these feelings get worse. ° °General instructions °Do not use any products that contain nicotine or tobacco. These products include cigarettes, chewing tobacco, and vaping devices, such as e-cigarettes. If you need help quitting, ask your health care provider. °Help your loved one create a daily routine. This may include setting reminders or having a shared day planner or calendar. °Encourage rest. Your loved one may need frequent breaks during social situations or other activities. °Be patient. Your loved one may take longer to complete tasks and to process information. °When giving instructions, give only one instruction at a time or give step-by-step lists. Multitasking can be difficult after a stroke. °Offer help with household chores or other daily tasks. Freeze meals that can be reheated. °Attend rehabilitation appointments with your loved one. By being involved in the rehabilitation plan, you can encourage your loved one and help   with exercises and therapy at home. Help your loved one keep all follow-up visits. This is important. Where to find more information American Stroke Association: www.stroke.org Heart and Stroke: www.heartandstroke.ca Stroke Family Warmline:  6472156532 Contact a health care provider if: You become depressed or anxious. You have difficulty taking care of your loved one at home. You have anger toward the person you are caring for. You have social withdrawal from friends and activities you enjoy. You are losing control physically or emotionally. Get help right away if: You feel hopeless. You have suicidal thoughts. Summary Caregivers provide essential physical and emotional support to people who have had a stroke. Before the person you are caring for leaves the hospital, make sure you understand how to care for someone who has had a stroke. It is normal to have many different emotions while caring for someone who has had a stroke. Make sure to care for your own well-being during this time. This information is not intended to replace advice given to you by your health care provider. Make sure you discuss any questions you have with your health care provider. Document Revised: 08/04/2020 Document Reviewed: 08/04/2020 Elsevier Patient Education  2022 [employer].

## 2021-09-24 NOTE — Progress Notes (Signed)
Subjective:    Patient ID: Gail Perez, female    DOB: 29-Aug-1990, 31 y.o.   MRN: [id number]  HPI  Patient presents to clinic today with complaint of caregiver stress.  She reports her father recently moved in with her after having a major stroke.  She is his primary caregiver.  There is a home health aides/nursing but they only come in 1 time weekly.  She is having difficulty juggling taking care of her 3 kids, working full-time and being the primary caregiver of her father.  She is not sleeping well, waking up every hour.  She is fatigued throughout the day.  She reports increased appetite.  She reports intermittent anxiety and "meltdowns" but denies overt depression.  She would like to take a leave of absence from work from February 13 for 3 months.  She has FMLA forms and disability forms to be completed.      Past Medical History:  Diagnosis Date   Allergy    Chlamydia    Hx of varicella    Pregnancy induced hypertension 2014   Seasonal allergies    Urinary tract infection    Vaginal Pap smear, abnormal     Current Outpatient Medications  Medication Sig Dispense Refill   acetaminophen (TYLENOL) 500 MG tablet Take 500 mg by mouth every 6 (six) hours as needed.     No current facility-administered medications for this visit.    No Known Allergies  Family History  Problem Relation Age of Onset   Hypertension Father    Hyperlipidemia Father    Heart disease Father        enlatged heart   Sarcoidosis Father    Anemia Mother    Cancer Maternal Grandfather        lung   Hypertension Sister    Other Neg Hx     Social History   Socioeconomic History   Marital status: Married    Spouse name: Not on file   Number of children: Not on file   Years of education: Not on file   Highest education level: Not on file  Occupational History   Not on file  Tobacco Use   Smoking status: Never   Smokeless tobacco: Never  Vaping Use   Vaping Use: Never used  Substance and  Sexual Activity   Alcohol use: Yes    Comment: occasional   Drug use: No   Sexual activity: Yes    Birth control/protection: Condom  Other Topics Concern   Not on file  Social History Narrative   Not on file   Social Determinants of Health   Financial Resource Strain: Not on file  Food Insecurity: Not on file  Transportation Needs: Not on file  Physical Activity: Not on file  Stress: Not on file  Social Connections: Not on file  Intimate Partner Violence: Not on file     Constitutional: Patient reports fatigue.  Denies fever, malaise, headache or abrupt weight changes.  Respiratory: Denies difficulty breathing, shortness of breath, cough or sputum production.   Cardiovascular: Denies chest pain, chest tightness, palpitations or swelling in the hands or feet.  Gastrointestinal: Patient reports increased appetite.  Denies abdominal pain, bloating, constipation, diarrhea or blood in the stool.  Neurological: Patient reports insomnia.  Denies dizziness, difficulty with memory, difficulty with speech or problems with balance and coordination.  Psych: Pt reports stress, intermittent anxiety. Denies depression, SI/HI.  No other specific complaints in a complete review of systems (except as listed in  HPI above).  Objective:   Physical Exam   BP 120/71 (BP Location: Left Arm, Patient Position: Sitting, Cuff Size: Large)    Pulse 80    Temp (!) 97.3 F (36.3 C) (Temporal)    Wt 201 lb (91.2 kg)    LMP 06/27/2021 Comment: G4 P3   SpO2 100%    BMI 29.68 kg/m   LMP 06/27/2021 Comment: G4 P3 Wt Readings from Last 3 Encounters:  09/24/21 199 lb 12.8 oz (90.6 kg)  08/19/21 200 lb (90.7 kg)  08/03/21 202 lb 12.8 oz (92 kg)    General: Appears her stated age, overweight, in NAD. Cardiovascular: Normal rate. Pulmonary/Chest: Normal effort. Neurological: Alert and oriented.  Psychiatric: Mood and affect normal.  Mildly anxious.. Judgment and thought content normal.    BMET     Component Value Date/Time   NA 136 08/02/2021 0847   K 4.1 08/02/2021 0847   CL 106 08/02/2021 0847   CO2 24 08/02/2021 0847   GLUCOSE 100 (H) 08/02/2021 0847   BUN 8 08/02/2021 0847   CREATININE 0.90 08/02/2021 0847   CREATININE 0.70 01/19/2016 0001   CALCIUM 9.1 08/02/2021 0847   GFRNONAA >60 08/02/2021 0847   GFRAA >60 12/05/2017 1333    Lipid Panel  No results found for: CHOL, TRIG, HDL, CHOLHDL, VLDL, LDLCALC  CBC    Component Value Date/Time   WBC 6.3 08/02/2021 0847   RBC 4.67 08/02/2021 0847   HGB 12.9 08/02/2021 0847   HGB 11.3 02/13/2018 0000   HCT 40.2 08/02/2021 0847   HCT 34.1 02/13/2018 0000   PLT 346 08/02/2021 0847   PLT 290 02/13/2018 0000   MCV 86.1 08/02/2021 0847   MCV 81 02/13/2018 0000   MCH 27.6 08/02/2021 0847   MCHC 32.1 08/02/2021 0847   RDW 13.2 08/02/2021 0847   RDW 14.8 02/13/2018 0000   LYMPHSABS 1.7 08/02/2021 0847   LYMPHSABS 2.2 10/18/2017 1617   MONOABS 0.5 08/02/2021 0847   EOSABS 0.2 08/02/2021 0847   EOSABS 0.2 10/18/2017 1617   BASOSABS 0.1 08/02/2021 0847   BASOSABS 0.1 10/18/2017 1617    Hgb A1C No results found for: HGBA1C        Assessment & Plan:   Caregiver Stress, Anxiety, Encounter for Form Completion with Patient:  Support offered We will hold off on medication therapy or referral to psychology at this time We will fill out FMLA forms and disability forms and get them faxed back for her  Advised her to update me with new or worsening symptoms Nicki Reaper, NP This visit occurred during the SARS-CoV-2 public health emergency.  Safety protocols were in place, including screening questions prior to the visit, additional usage of staff PPE, and extensive cleaning of exam room while observing appropriate contact time as indicated for disinfecting solutions.

## 2021-09-25 NOTE — Progress Notes (Signed)
.dpl   Referring Provider Lorre Munroe, NP [address] [place],  [place] [address]   CC:  Breat hypertrophy  Gail Perez is an 31 y.o. female.  HPI:  Mammary Hyperplasia: The patient is a 31 y.o. female with a history of mammary hyperplasia for several years.  She has extremely large breasts causing symptoms that include the following: Back pain in the upper and lower back, including neck pain. She pulls or pins her bra straps to provide better lift and relief of the pressure and pain. She notices relief by holding her breast up manually.  Her shoulder straps cause grooves and pain and pressure that requires padding for relief. Pain medication is sometimes required with motrin and tylenol.  Activities that are hindered by enlarged breasts include: exercise and running.  She has tried supportive clothing as well as fitted bras without improvement.  Her breasts are extremely large and fairly symmetric with the left breast slightly larger.  She has hyperpigmentation of the inframammary area on both sides.   .   Mammogram history: none due to age.  Family history of breast cancer:  paternal gradmother .  Tobacco use:  none.   The patient expresses the desire to pursue surgical intervention.   No Known Allergies  Outpatient Encounter Medications as of 09/24/2021  Medication Sig   acetaminophen (TYLENOL) 500 MG tablet Take 500 mg by mouth every 6 (six) hours as needed.   No facility-administered encounter medications on file as of 09/24/2021.     Past Medical History:  Diagnosis Date   Allergy    Chlamydia    Hx of varicella    Pregnancy induced hypertension 2014   Seasonal allergies    Urinary tract infection    Vaginal Pap smear, abnormal     Past Surgical History:  Procedure Laterality Date   LEEP      Family History  Problem Relation Age of Onset   Hypertension Father    Hyperlipidemia Father    Heart disease Father        enlatged heart   Sarcoidosis Father    Anemia Mother     Cancer Maternal Grandfather        lung   Hypertension Sister    Other Neg Hx     Social History   Social History Narrative   Not on file     Review of Systems General: Denies fevers, chills, weight loss CV: Denies chest pain, shortness of breath, palpitations   Physical Exam Vitals with BMI 09/24/2021 09/24/2021 08/19/2021  Height - 5\' 9"  5\' 9"   Weight 201 lbs 199 lbs 13 oz 200 lbs  BMI 29.67 29.49 29.52  Systolic 120 128  Diastolic 71 75 78  Pulse 80 71 78    General:  No acute distress,  Alert and oriented, Non-Toxic, Normal speech and affect Breast:  Bilateral breast hypertrophy and significant ptosis.  The sternal to nipple distance on the right is 33 cm and the left is 34 cm.  The IMF distance is 20 cm.  Assessment/Plan The patient has bilateral symptomatic macromastia.  She is a good candidate for a breast reduction.  She is interested in pursuing surgical treatment.  She has tried supportive garments and fitted bras with no relief.  The details of breast reduction surgery were discussed.  I explained the procedure in detail along the with the expected scars.  The risks were discussed in detail and include bleeding, infection, damage to surrounding structures, need  for additional procedures, nipple loss, change in nipple sensation, persistent pain, contour irregularities and asymmetries.  I explained that breast feeding is often not possible after breast reduction surgery.  We discussed the expected postoperative course with an overall recovery period of about 1 month.  She demonstrated full understanding of all risks.  The patient is interested in pursuing surgical treatment. The estimated excess breast tissue to be removed at the time of surgery = 700-800 grams from each breast  Time based coding:71minutes were spent with the patient.  Greater than 50% was spent on counseling cordination of care.  We discussed risks and benefits of breast reduction.  Janne Napoleon 09/25/2021, 7:47 PM

## 2021-10-13 ENCOUNTER — Encounter: Payer: Self-pay | Admitting: Internal Medicine

## 2021-10-13 NOTE — Telephone Encounter (Signed)
This is a duplicate message please see previous MyChart message.   Thanks,   -Mickel Baas

## 2021-10-14 NOTE — Telephone Encounter (Signed)
Is this patient's disability paperwork up front or has it been scanned yet?

## 2021-10-15 ENCOUNTER — Encounter: Payer: Self-pay | Admitting: Internal Medicine

## 2021-11-06 ENCOUNTER — Telehealth: Payer: 59 | Admitting: Nurse Practitioner

## 2021-11-06 DIAGNOSIS — B379 Candidiasis, unspecified: Secondary | ICD-10-CM | POA: Diagnosis not present

## 2021-11-06 MED ORDER — FLUCONAZOLE 150 MG PO TABS: 150.0000 mg | ORAL_TABLET | Freq: Once | ORAL | 0 refills | Status: AC

## 2021-11-06 NOTE — Progress Notes (Signed)
?Virtual Visit Consent  ? ?Gail Perez, you are scheduled for a virtual visit with a [facility] provider today.   ?  ?Just as with appointments in the office, your consent must be obtained to participate.  Your consent will be active for this visit and any virtual visit you may have with one of our providers in the next 365 days.   ?  ?If you have a MyChart account, a copy of this consent can be sent to you electronically.  All virtual visits are billed to your insurance company just like a traditional visit in the office.   ? ?As this is a virtual visit, video technology does not allow for your provider to perform a traditional examination.  This may limit your provider's ability to fully assess your condition.  If your provider identifies any concerns that need to be evaluated in person or the need to arrange testing (such as labs, EKG, etc.), we will make arrangements to do so.   ?  ?Although advances in technology are sophisticated, we cannot ensure that it will always work on either your end or our end.  If the connection with a video visit is poor, the visit may have to be switched to a telephone visit.  With either a video or telephone visit, we are not always able to ensure that we have a secure connection.    ? ?I need to obtain your verbal consent now.   Are you willing to proceed with your visit today?  ?  ?Gail Perez has provided verbal consent on 11/06/2021 for a virtual visit (video or telephone). ?  ?Viviano Simas, FNP  ? ?Date: 11/06/2021 8:56 AM ? ? ?Virtual Visit via Video Note  ? ?IViviano Simas, connected with  Gail Perez  ([id number], [birth year]) on 11/06/21 at  9:00 AM EDT by a video-enabled telemedicine application and verified that I am speaking with the correct person using two identifiers. ? ?Location: ?Patient: Virtual Visit Location Patient: Home ?Provider: Virtual Visit Location Provider: Home Office ?  ?I discussed the limitations of evaluation and management by telemedicine and the  availability of in person appointments. The patient expressed understanding and agreed to proceed.   ? ?History of Present Illness: ?Gail Perez is a 31 y.o. who identifies as a female who was assigned female at birth, and is being seen today with complaints of vaginal itching for the past 3-4 days. She believes it is a yeast infection. Denies an odor. She was using Darene Lamer to her pubic region and does have some irritation to skin around the area as well.  ? ?She does have some discharge, not as much as infections in the past.  ? ?Had her cycle 2 weeks ago denies chance of pregnancy  ? ?Problems:  ?Patient Active Problem List  ? Diagnosis Date Noted  ? Overweight with body mass index (BMI) of 29 to 29.9 in adult 08/19/2021  ?  ?Allergies: No Known Allergies ?Medications:  ?Current Outpatient Medications:  ?  acetaminophen (TYLENOL) 500 MG tablet, Take 500 mg by mouth every 6 (six) hours as needed., Disp: , Rfl:  ? ?Observations/Objective: ?Patient is well-developed, well-nourished in no acute distress.  ?Resting comfortably at home.  ?Head is normocephalic, atraumatic.  ?No labored breathing.  ?Speech is clear and coherent with logical content.  ?Patient is alert and oriented at baseline.  ? ? ?Assessment and Plan: ?1. Yeast infection ? ?- fluconazole (DIFLUCAN) 150 MG tablet; Take 1 tablet (150 mg total) by mouth  once for 1 dose.  Dispense: 1 tablet; Refill: 0 ?   ? ?Follow Up Instructions: ?I discussed the assessment and treatment plan with the patient. The patient was provided an opportunity to ask questions and all were answered. The patient agreed with the plan and demonstrated an understanding of the instructions.  A copy of instructions were sent to the patient via MyChart unless otherwise noted below.  ? ?The patient was advised to call back or seek an in-person evaluation if the symptoms worsen or if the condition fails to improve as anticipated. ? ?Time:  ?I spent 10 minutes with the patient via telehealth  technology discussing the above problems/concerns.   ? ?Viviano Simas, FNP  ?

## 2021-11-15 ENCOUNTER — Telehealth: Payer: Self-pay

## 2021-11-15 NOTE — Telephone Encounter (Signed)
Called patient, advised her insurance denied pre-authorization. She will need to go to PT for 6 week, and weight management for 3 months.  Will set the referrals, requested [place] for her location. Patient is to call our office to schedule a follow up appointment once she is completed her requirements. ? ?

## 2021-11-19 ENCOUNTER — Telehealth: Payer: Self-pay

## 2021-11-19 NOTE — Telephone Encounter (Signed)
Patient is requesting weigh management in [place]. Called [facility], LMVM for additional information to see if they qualify what we need for the patient. If so, need  fax number. ?

## 2021-12-10 ENCOUNTER — Other Ambulatory Visit: Payer: Self-pay

## 2021-12-10 DIAGNOSIS — N62 Hypertrophy of breast: Secondary | ICD-10-CM

## 2021-12-10 DIAGNOSIS — G8929 Other chronic pain: Secondary | ICD-10-CM

## 2021-12-13 ENCOUNTER — Telehealth: Payer: Self-pay

## 2021-12-13 NOTE — Telephone Encounter (Signed)
Faxed PT referral to [facility] ?

## 2022-01-04 ENCOUNTER — Inpatient Hospital Stay (HOSPITAL_COMMUNITY)
Admission: AD | Admit: 2022-01-04 | Discharge: 2022-01-04 | Disposition: A | Discharge status: Home / Self Care | Payer: 59 | Attending: Obstetrics & Gynecology | Admitting: Obstetrics & Gynecology

## 2022-01-04 ENCOUNTER — Encounter (HOSPITAL_COMMUNITY): Payer: Self-pay

## 2022-01-04 ENCOUNTER — Inpatient Hospital Stay (HOSPITAL_COMMUNITY): Payer: 59

## 2022-01-04 ENCOUNTER — Other Ambulatory Visit: Payer: Self-pay

## 2022-01-04 DIAGNOSIS — Z3491 Encounter for supervision of normal pregnancy, unspecified, first trimester: Secondary | ICD-10-CM | POA: Diagnosis not present

## 2022-01-04 DIAGNOSIS — O358XX0 Maternal care for other (suspected) fetal abnormality and damage, not applicable or unspecified: Secondary | ICD-10-CM | POA: Insufficient documentation

## 2022-01-04 DIAGNOSIS — O468X1 Other antepartum hemorrhage, first trimester: Secondary | ICD-10-CM

## 2022-01-04 DIAGNOSIS — O26891 Other specified pregnancy related conditions, first trimester: Secondary | ICD-10-CM | POA: Diagnosis present

## 2022-01-04 DIAGNOSIS — O418X10 Other specified disorders of amniotic fluid and membranes, first trimester, not applicable or unspecified: Secondary | ICD-10-CM | POA: Diagnosis not present

## 2022-01-04 DIAGNOSIS — Z3201 Encounter for pregnancy test, result positive: Secondary | ICD-10-CM | POA: Insufficient documentation

## 2022-01-04 DIAGNOSIS — O219 Vomiting of pregnancy, unspecified: Secondary | ICD-10-CM

## 2022-01-04 DIAGNOSIS — R109 Unspecified abdominal pain: Secondary | ICD-10-CM | POA: Diagnosis present

## 2022-01-04 DIAGNOSIS — Z3A01 Less than 8 weeks gestation of pregnancy: Secondary | ICD-10-CM

## 2022-01-04 LAB — URINALYSIS, ROUTINE W REFLEX MICROSCOPIC
Bilirubin Urine: NEGATIVE
Glucose, UA: NEGATIVE mg/dL
Ketones, ur: NEGATIVE mg/dL
Leukocytes,Ua: NEGATIVE
Nitrite: NEGATIVE
Protein, ur: NEGATIVE mg/dL
Specific Gravity, Urine: 1.019 (ref 1.005–1.030)
pH: 5 (ref 5.0–8.0)

## 2022-01-04 LAB — CBC
HCT: 38 % (ref 36.0–46.0)
Hemoglobin: 12.5 g/dL (ref 12.0–15.0)
MCH: 28.1 pg (ref 26.0–34.0)
MCHC: 32.9 g/dL (ref 30.0–36.0)
MCV: 85.4 fL (ref 80.0–100.0)
Platelets: 351 10*3/uL (ref 150–400)
RBC: 4.45 MIL/uL (ref 3.87–5.11)
RDW: 13 % (ref 11.5–15.5)
WBC: 10.4 10*3/uL (ref 4.0–10.5)
nRBC: 0 % (ref 0.0–0.2)

## 2022-01-04 LAB — POCT PREGNANCY, URINE: Preg Test, Ur: POSITIVE — AB

## 2022-01-04 LAB — HCG, QUANTITATIVE, PREGNANCY: hCG, Beta Chain, Quant, S: 123979 m[IU]/mL — ABNORMAL HIGH (ref <5)

## 2022-01-04 MED ORDER — METOCLOPRAMIDE HCL 10 MG PO TABS: 10.0000 mg | ORAL_TABLET | Freq: Three times a day (TID) | ORAL | 0 refills | Status: DC | PRN

## 2022-01-04 NOTE — Discharge Instructions (Addendum)
Return to care  If you have heavier bleeding that soaks through more than 2 pads per hour for an hour or more If you bleed so much that you feel like you might pass out or you do pass out If you have significant abdominal pain that is not improved with Tylenol   

## 2022-01-04 NOTE — MAU Note (Signed)
Gail Perez is a 31 y.o. at [redacted]w[redacted]d here in MAU reporting: today while working she got a severe cramp and the pain made her sweat and have SOB. States these symptoms have since subsided. No bleeding or discharge.  ? ?LMP: 11/20/2021 ? ?Onset of complaint: today ? ?Pain score: 0/10 ? ?Vitals:  ? 01/04/22 1313  ?BP: 128/68  ?Pulse: 78  ?Resp: 16  ?Temp: 97.8 ?F (36.6 ?C)  ?SpO2: 97%  ?   ?Lab orders placed from triage: ua, upt ? ?

## 2022-01-04 NOTE — MAU Provider Note (Signed)
?History  ?  ? ?[id number] ? ?Arrival date and time: 01/04/22 1254 ?  ? ?Chief Complaint  ?Patient presents with  ? Abdominal Pain  ? ? ? ?HPI ?Gail Bernyisha Perez is a 31 y.o. at 2266w3d by LMP who presents for abdominal pain. ?Reports one episode of abdominal pain that occurred around 1130 am this morning. Reports sharp cramping pain throughout her abdomen that lasted about 5 minutes until she vomited. Symptoms resolved after vomiting. Reports 1 episode of diarrhea today but had more episodes yesterday. Continues to have nausea & daily vomiting. Doesn't have antiemetic.  ?Denies fever, dysuria, vaginal bleeding, or abnormal discharge.  ? ?OB History   ? ? Gravida  ?5  ? Para  ?3  ? Term  ?3  ? Preterm  ?0  ? AB  ?1  ? Living  ?3  ?  ? ? SAB  ?1  ? IAB  ?0  ? Ectopic  ?0  ? Multiple  ?0  ? Live Births  ?3  ?   ?  ?  ? ? ?Past Medical History:  ?Diagnosis Date  ? Allergy   ? Chlamydia   ? Hx of varicella   ? Pregnancy induced hypertension 2014  ? Seasonal allergies   ? Urinary tract infection   ? Vaginal Pap smear, abnormal   ? ? ?Past Surgical History:  ?Procedure Laterality Date  ? LEEP    ? ? ?Family History  ?Problem Relation Age of Onset  ? Hypertension Father   ? Hyperlipidemia Father   ? Heart disease Father   ?     enlatged heart  ? Sarcoidosis Father   ? Anemia Mother   ? Cancer Maternal Grandfather   ?     lung  ? Hypertension Sister   ? Other Neg Hx   ? ? ?No Known Allergies ? ?No current facility-administered medications on file prior to encounter.  ? ?Current Outpatient Medications on File Prior to Encounter  ?Medication Sig Dispense Refill  ? acetaminophen (TYLENOL) 500 MG tablet Take 500 mg by mouth every 6 (six) hours as needed.    ? ? ? ?ROS ?Pertinent positives and negative per HPI, all others reviewed and negative ? ?Physical Exam  ? ?BP 128/68 (BP Location: Right Arm)   Pulse 78   Temp 97.8 ?F (36.6 ?C) (Oral)   Resp 16   LMP 11/20/2021 Comment: G4 P3  SpO2 97% Comment: room air  Breastfeeding Unknown   ? ?Patient Vitals for the past 24 hrs: ? BP Temp Temp src Pulse Resp SpO2  ?01/04/22 1313 128/68 97.8 ?F (36.6 ?C) Oral 78 16 97 %  ? ? ?Physical Exam ?Vitals and nursing note reviewed.  ?Constitutional:   ?   General: She is not in acute distress. ?   Appearance: She is well-developed. She is not ill-appearing.  ?HENT:  ?   Head: Normocephalic and atraumatic.  ?Pulmonary:  ?   Effort: Pulmonary effort is normal. No respiratory distress.  ?Abdominal:  ?   General: Abdomen is flat.  ?   Palpations: Abdomen is soft.  ?   Tenderness: There is no abdominal tenderness. There is no guarding or rebound.  ?Skin: ?   General: Skin is warm and dry.  ?Neurological:  ?   General: No focal deficit present.  ?   Mental Status: She is alert.  ?Psychiatric:     ?   Mood and Affect: Mood normal.     ?   Behavior: Behavior normal.  ?  ? ? ?  Labs ?Results for orders placed or performed during the hospital encounter of 01/04/22 (from the past 24 hour(s))  ?Pregnancy, urine POC     Status: Abnormal  ? Collection Time: 01/04/22  1:07 PM  ?Result Value Ref Range  ? Preg Test, Ur POSITIVE (A) NEGATIVE  ?Urinalysis, Routine w reflex microscopic Urine, Clean Catch     Status: Abnormal  ? Collection Time: 01/04/22  1:08 PM  ?Result Value Ref Range  ? Color, Urine YELLOW YELLOW  ? APPearance HAZY (A) CLEAR  ? Specific Gravity, Urine 1.019 1.005 - 1.030  ? pH 5.0 5.0 - 8.0  ? Glucose, UA NEGATIVE NEGATIVE mg/dL  ? Hgb urine dipstick SMALL (A) NEGATIVE  ? Bilirubin Urine NEGATIVE NEGATIVE  ? Ketones, ur NEGATIVE NEGATIVE mg/dL  ? Protein, ur NEGATIVE NEGATIVE mg/dL  ? Nitrite NEGATIVE NEGATIVE  ? Leukocytes,Ua NEGATIVE NEGATIVE  ? RBC / HPF 0-5 0 - 5 RBC/hpf  ? WBC, UA 11-20 0 - 5 WBC/hpf  ? Bacteria, UA RARE (A) NONE SEEN  ? Squamous Epithelial / LPF 0-5 0 - 5  ? Mucus PRESENT   ?CBC     Status: None  ? Collection Time: 01/04/22  1:43 PM  ?Result Value Ref Range  ? WBC 10.4 4.0 - 10.5 K/uL  ? RBC 4.45 3.87 - 5.11 MIL/uL  ? Hemoglobin 12.5 12.0  - 15.0 g/dL  ? HCT 38.0 36.0 - 46.0 %  ? MCV 85.4 80.0 - 100.0 fL  ? MCH 28.1 26.0 - 34.0 pg  ? MCHC 32.9 30.0 - 36.0 g/dL  ? RDW 13.0 11.5 - 15.5 %  ? Platelets 351 150 - 400 K/uL  ? nRBC 0.0 0.0 - 0.2 %  ?hCG, quantitative, pregnancy     Status: Abnormal  ? Collection Time: 01/04/22  1:43 PM  ?Result Value Ref Range  ? hCG, Beta Chain, Quant, S 123,979 (H) <5 mIU/mL  ? ? ?Imaging ?US OB LESS THAN 14 WEEKS WITH OB TRANSVAGINAL ? ?Result Date: 01/04/2022 ?CLINICAL DATA:  Abdominal pain for 1 day in first trimester of pregnancy, LMP 11/20/2021; no quantitative beta HCG for correlation EXAM: OBSTETRIC <14 WK [place] AND TRANSVAGINAL OB US TECHNIQUE: Both transabdominal and transvaginal ultrasound examinations were performed for complete evaluation of the gestation as well as the maternal uterus, adnexal regions, and pelvic cul-de-sac. Transvaginal technique was performed to assess early pregnancy. COMPARISON:  None FINDINGS: Intrauterine gestational sac: Present, single Yolk sac:  Present Embryo:  Present Cardiac Activity: Present Heart Rate: 133 bpm CRL:  7.9 mm   6 w   4 d                  [place] EDC: 08/26/2022 Subchorionic hemorrhage:  Small subchronic hemorrhage Maternal uterus/adnexae: Uterus anteverted, otherwise normal appearance. Ovaries unremarkable. No free pelvic fluid or adnexal masses IMPRESSION: Single live intrauterine gestation at 6 weeks 4 days EGA. Small subchronic hemorrhage. Electronically Signed   By: Ulyses Southward M.D.   On: 01/04/2022 14:29   ? ?MAU Course  ?Procedures ?Lab Orders    ?     Culture, OB Urine    ?     Urinalysis, Routine w reflex microscopic Urine, Clean Catch    ?     CBC    ?     hCG, quantitative, pregnancy    ?     Pregnancy, urine POC    ? ?Meds ordered this encounter  ?Medications  ? metoCLOPramide (REGLAN) 10 MG tablet  ?  Sig: Take 1 tablet (10 mg total) by mouth every 8 (eight) hours as needed for nausea.  ?  Dispense:  30 tablet  ?  Refill:  0  ?  Order Specific Question:    Supervising Provider  ?  Answer:   Duane Lope H [[id number]]  ? ?Imaging Orders    ?     US OB LESS THAN 14 WEEKS WITH OB TRANSVAGINAL    ? ? ?MDM ?+UPT ?UA, CBC, ABO/Rh, quant hCG, and [place] today to rule out ectopic pregnancy which can be life threatening.  ? ?Ultrasound shows live IUP & small subchorionic hemorrhage. Currently no bleeding & patient is RH positive.  ? ?U/a with some hemoglobin & bacteria. Asymptomatic. Urine culture sent.  ? ?Suspect episode of pain GI in nature. Discussed treatment & monitoring of future symptoms. Will send rx for reglan.  ? ?Was planning on f/u with OB in [place] but is interested in transferring care as she wants to deliver at [facility]. Given information to establish care at [facility]  ?Assessment and Plan  ? ?1. Abdominal pain during pregnancy in first trimester  ?-Reviewed reasons to return to MAU ?-urine culture pending  ?2. Nausea and vomiting during pregnancy prior to [redacted] weeks gestation  ?-Rx reglan  ?3. Normal intrauterine pregnancy on prenatal ultrasound in first trimester  ?-Start prenatal care  ?4. Subchorionic hematoma in first trimester, single or unspecified fetus  ?-Reviewed bleeding precautions  ?5. [redacted] weeks gestation of pregnancy   ? ? ? ?Judeth Horn, NP ?01/04/22 ?3:28 PM ? ? ?

## 2022-01-11 ENCOUNTER — Telehealth: Payer: Self-pay

## 2022-01-11 NOTE — Telephone Encounter (Signed)
Left message for pt to call the office back regarding message left with answering service.

## 2022-01-28 ENCOUNTER — Telehealth: Payer: Self-pay | Admitting: Plastic Surgery

## 2022-01-28 NOTE — Telephone Encounter (Signed)
Checked status of referrals to Medical weight mgmt: "Patient wanting to call insurance to verify if these appointments are necessary. But now has found out she is pregnant and therefore an appointment with the dietitian is not needed at this time since she cannot have breast reduction at this time."  Patient not moving forward with surgery at this time due to being pregnant. Closing out surgery order.

## 2022-02-15 ENCOUNTER — Other Ambulatory Visit (HOSPITAL_COMMUNITY)
Admission: RE | Admit: 2022-02-15 | Discharge: 2022-02-15 | Disposition: A | Discharge status: Home / Self Care | Payer: 59 | Source: Ambulatory Visit | Attending: Obstetrics & Gynecology | Admitting: Obstetrics & Gynecology

## 2022-02-15 ENCOUNTER — Encounter: Payer: Self-pay | Admitting: Obstetrics & Gynecology

## 2022-02-15 ENCOUNTER — Ambulatory Visit (INDEPENDENT_AMBULATORY_CARE_PROVIDER_SITE_OTHER): Payer: BC Managed Care – PPO | Admitting: Obstetrics & Gynecology

## 2022-02-15 VITALS — BP 135/77 | HR 82 | Wt 210.6 lb

## 2022-02-15 DIAGNOSIS — Z3A12 12 weeks gestation of pregnancy: Secondary | ICD-10-CM | POA: Diagnosis present

## 2022-02-15 DIAGNOSIS — Z3402 Encounter for supervision of normal first pregnancy, second trimester: Secondary | ICD-10-CM

## 2022-02-15 DIAGNOSIS — Z3490 Encounter for supervision of normal pregnancy, unspecified, unspecified trimester: Secondary | ICD-10-CM | POA: Diagnosis present

## 2022-02-15 DIAGNOSIS — O99210 Obesity complicating pregnancy, unspecified trimester: Secondary | ICD-10-CM

## 2022-02-15 MED ORDER — ASPIRIN 81 MG PO TBEC: 81.0000 mg | DELAYED_RELEASE_TABLET | Freq: Every day | ORAL | 2 refills | Status: DC

## 2022-02-16 LAB — COMPREHENSIVE METABOLIC PANEL WITH GFR
ALT: 13 IU/L (ref 0–32)
AST: 14 IU/L (ref 0–40)
Albumin/Globulin Ratio: 1.2 (ref 1.2–2.2)
Albumin: 3.9 g/dL (ref 3.8–4.8)
Alkaline Phosphatase: 53 IU/L (ref 44–121)
BUN/Creatinine Ratio: 12 (ref 9–23)
BUN: 9 mg/dL (ref 6–20)
Bilirubin Total: 0.2 mg/dL (ref 0.0–1.2)
CO2: 22 mmol/L (ref 20–29)
Calcium: 9.6 mg/dL (ref 8.7–10.2)
Chloride: 99 mmol/L (ref 96–106)
Creatinine, Ser: 0.76 mg/dL (ref 0.57–1.00)
Globulin, Total: 3.2 g/dL (ref 1.5–4.5)
Glucose: 87 mg/dL (ref 70–99)
Potassium: 4.4 mmol/L (ref 3.5–5.2)
Sodium: 136 mmol/L (ref 134–144)
Total Protein: 7.1 g/dL (ref 6.0–8.5)
eGFR: 107 mL/min/1.73

## 2022-02-16 LAB — CBC/D/PLT+RPR+RH+ABO+RUBIGG...
Antibody Screen: NEGATIVE
Basophils Absolute: 0.1 10*3/uL (ref 0.0–0.2)
Basos: 1 %
EOS (ABSOLUTE): 0.1 10*3/uL (ref 0.0–0.4)
Eos: 1 %
HCV Ab: NONREACTIVE
HIV Screen 4th Generation wRfx: NONREACTIVE
Hematocrit: 38.2 % (ref 34.0–46.6)
Hemoglobin: 12.9 g/dL (ref 11.1–15.9)
Hepatitis B Surface Ag: NEGATIVE
Immature Grans (Abs): 0.1 10*3/uL (ref 0.0–0.1)
Immature Granulocytes: 1 %
Lymphocytes Absolute: 1.6 10*3/uL (ref 0.7–3.1)
Lymphs: 16 %
MCH: 28.3 pg (ref 26.6–33.0)
MCHC: 33.8 g/dL (ref 31.5–35.7)
MCV: 84 fL (ref 79–97)
Monocytes Absolute: 0.6 10*3/uL (ref 0.1–0.9)
Monocytes: 6 %
Neutrophils Absolute: 7.6 10*3/uL — ABNORMAL HIGH (ref 1.4–7.0)
Neutrophils: 75 %
Platelets: 356 10*3/uL (ref 150–450)
RBC: 4.56 x10E6/uL (ref 3.77–5.28)
RDW: 12.8 % (ref 11.7–15.4)
RPR Ser Ql: NONREACTIVE
Rh Factor: POSITIVE
Rubella Antibodies, IGG: 6.85 index (ref >0.99)
WBC: 10.1 10*3/uL (ref 3.4–10.8)

## 2022-02-16 LAB — TSH RFX ON ABNORMAL TO FREE T4: TSH: 0.038 u[IU]/mL — ABNORMAL LOW (ref 0.450–4.500)

## 2022-02-16 LAB — PROTEIN / CREATININE RATIO, URINE
Creatinine, Urine: 288.7 mg/dL
Protein, Ur: 16.9 mg/dL
Protein/Creat Ratio: 59 mg/g{creat} (ref 0–200)

## 2022-02-16 LAB — HCV INTERPRETATION

## 2022-02-16 LAB — HEMOGLOBIN A1C
Est. average glucose Bld gHb Est-mCnc: 103 mg/dL
Hgb A1c MFr Bld: 5.2 % (ref 4.8–5.6)

## 2022-02-16 LAB — T4F: T4,Free (Direct): 1.4 ng/dL (ref 0.82–1.77)

## 2022-02-17 LAB — CYTOLOGY - PAP
Chlamydia: NEGATIVE
Comment: NEGATIVE
Comment: NEGATIVE
Comment: NEGATIVE
Comment: NORMAL
Diagnosis: NEGATIVE
High risk HPV: NEGATIVE
Neisseria Gonorrhea: NEGATIVE
Trichomonas: NEGATIVE

## 2022-02-17 LAB — URINE CULTURE, OB REFLEX

## 2022-02-17 LAB — CULTURE, OB URINE

## 2022-02-18 ENCOUNTER — Encounter: Payer: Self-pay | Admitting: Internal Medicine

## 2022-02-20 LAB — PANORAMA PRENATAL TEST FULL PANEL:PANORAMA TEST PLUS 5 ADDITIONAL MICRODELETIONS: FETAL FRACTION: 6.9

## 2022-02-24 ENCOUNTER — Telehealth (INDEPENDENT_AMBULATORY_CARE_PROVIDER_SITE_OTHER): Payer: 59 | Admitting: Internal Medicine

## 2022-02-24 ENCOUNTER — Encounter: Payer: Self-pay | Admitting: Internal Medicine

## 2022-02-24 DIAGNOSIS — F419 Anxiety disorder, unspecified: Secondary | ICD-10-CM | POA: Diagnosis not present

## 2022-02-24 DIAGNOSIS — Z0289 Encounter for other administrative examinations: Secondary | ICD-10-CM

## 2022-02-24 DIAGNOSIS — Z3A13 13 weeks gestation of pregnancy: Secondary | ICD-10-CM | POA: Diagnosis not present

## 2022-02-24 DIAGNOSIS — F32A Depression, unspecified: Secondary | ICD-10-CM

## 2022-02-24 DIAGNOSIS — Z636 Dependent relative needing care at home: Secondary | ICD-10-CM

## 2022-02-24 LAB — HORIZON CUSTOM: REPORT SUMMARY: POSITIVE — AB

## 2022-02-24 NOTE — Progress Notes (Signed)
Virtual Visit via Video Note  I connected with Gail Perez on 02/24/22 at 11:20 AM EDT by a video enabled telemedicine application and verified that I am speaking with the correct person using two identifiers.  Location: Patient: Home Provider: Office   I discussed the limitations of evaluation and management by telemedicine and the availability of in person appointments. The patient expressed understanding and agreed to proceed.  History of Present Illness:  Patient asking for FMLA form completion.  She would like to work from home for an additional 3 months.  She has a lot of caregiver stress, caring for her father who has had a few strokes.  She also recently found out that she was pregnant.  She did miscarry within the last year which she thinks is a result of stress.   Past Medical History:  Diagnosis Date   Allergy    Chlamydia    Hx of varicella    Pregnancy induced hypertension 2014   Seasonal allergies    Urinary tract infection    Vaginal Pap smear, abnormal     Current Outpatient Medications  Medication Sig Dispense Refill   acetaminophen (TYLENOL) 500 MG tablet Take 500 mg by mouth every 6 (six) hours as needed.     aspirin EC 81 MG tablet Take 1 tablet (81 mg total) by mouth daily. Take after 12 weeks for prevention of preeclampsia later in pregnancy 300 tablet 2   metoCLOPramide (REGLAN) 10 MG tablet Take 1 tablet (10 mg total) by mouth every 8 (eight) hours as needed for nausea. (Patient not taking: Reported on 02/15/2022) 30 tablet 0   No current facility-administered medications for this visit.    No Known Allergies  Family History  Problem Relation Age of Onset   Hypertension Father    Hyperlipidemia Father    Heart disease Father        enlatged heart   Sarcoidosis Father    Anemia Mother    Cancer Maternal Grandfather        lung   Hypertension Sister    Other Neg Hx     Social History   Socioeconomic History   Marital status: Married     Spouse name: Not on file   Number of children: Not on file   Years of education: Not on file   Highest education level: Not on file  Occupational History   Not on file  Tobacco Use   Smoking status: Never   Smokeless tobacco: Never  Vaping Use   Vaping Use: Never used  Substance and Sexual Activity   Alcohol use: Not Currently    Comment: occasional   Drug use: No   Sexual activity: Yes    Birth control/protection: Condom  Other Topics Concern   Not on file  Social History Narrative   Not on file   Social Determinants of Health   Financial Resource Strain: Low Risk  (04/30/2018)   Overall Financial Resource Strain (CARDIA)    Difficulty of Paying Living Expenses: Not hard at all  Food Insecurity: No Food Insecurity (04/30/2018)   Hunger Vital Sign    Worried About Running Out of Food in the Last Year: Never true    Ran Out of Food in the Last Year: Never true  Transportation Needs: Unknown (04/30/2018)   PRAPARE - Administrator, Civil Service (Medical): No    Lack of Transportation (Non-Medical): Not on file  Physical Activity: Inactive (04/30/2018)   Exercise Vital Sign  Days of Exercise per Week: 0 days    Minutes of Exercise per Session: 0 min  Stress: Not on file  Social Connections: Not on file  Intimate Partner Violence: Not At Risk (04/30/2018)   Humiliation, Afraid, Rape, and Kick questionnaire    Fear of Current or Ex-Partner: No    Emotionally Abused: No    Physically Abused: No    Sexually Abused: No     Constitutional: Denies fever, malaise, fatigue, headache or abrupt weight changes.  Respiratory: Denies difficulty breathing, shortness of breath, cough or sputum production.   Cardiovascular: Denies chest pain, chest tightness, palpitations or swelling in the hands or feet.  Skin: Denies redness, rashes, lesions or ulcercations.  Neurological: Patient reports difficulty sleeping, difficulty focusing.  Denies dizziness, difficulty with memory,  difficulty with speech or problems with balance and coordination.  Psych: Patient reports caregiver stress, has a history of anxiety and depression..  Denies SI/HI.  No other specific complaints in a complete review of systems (except as listed in HPI above).    Observations/Objective:  LMP 11/20/2021 Comment: G4 P3 Wt Readings from Last 3 Encounters:  02/15/22 210 lb 9.6 oz (95.5 kg)  09/24/21 201 lb (91.2 kg)  09/24/21 199 lb 12.8 oz (90.6 kg)    General: Appears her stated age, well developed, well nourished in NAD. Pulmonary/Chest: Normal effort. No respiratory distress.  Neurological: Alert and oriented.  Psychiatric: Mood and affect normal. Behavior is normal. Judgment and thought content normal.     BMET    Component Value Date/Time   NA 136 02/15/2022 1547   K 4.4 02/15/2022 1547   CL 99 02/15/2022 1547   CO2 22 02/15/2022 1547   GLUCOSE 87 02/15/2022 1547   GLUCOSE 100 (H) 08/02/2021 0847   BUN 9 02/15/2022 1547   CREATININE 0.76 02/15/2022 1547   CREATININE 0.70 01/19/2016 0001   CALCIUM 9.6 02/15/2022 1547   GFRNONAA >60 08/02/2021 0847   GFRAA >60 12/05/2017 1333    Lipid Panel  No results found for: "CHOL", "TRIG", "HDL", "CHOLHDL", "VLDL", "LDLCALC"  CBC    Component Value Date/Time   WBC 10.1 02/15/2022 1547   WBC 10.4 01/04/2022 1343   RBC 4.56 02/15/2022 1547   RBC 4.45 01/04/2022 1343   HGB 12.9 02/15/2022 1547   HCT 38.2 02/15/2022 1547   PLT 356 02/15/2022 1547   MCV 84 02/15/2022 1547   MCH 28.3 02/15/2022 1547   MCH 28.1 01/04/2022 1343   MCHC 33.8 02/15/2022 1547   MCHC 32.9 01/04/2022 1343   RDW 12.8 02/15/2022 1547   LYMPHSABS 1.6 02/15/2022 1547   MONOABS 0.5 08/02/2021 0847   EOSABS 0.1 02/15/2022 1547   BASOSABS 0.1 02/15/2022 1547    Hgb A1C Lab Results  Component Value Date   HGBA1C 5.2 02/15/2022       Assessment and Plan:  Encounter for Form Completion with Patient, Caregiver Stress, Anxiety and Depression,  Pregnancy:  We will provide her with another letter requesting that she be allowed to work from home until April 25, 2022 Support offered Follow Up Instructions:    I discussed the assessment and treatment plan with the patient. The patient was provided an opportunity to ask questions and all were answered. The patient agreed with the plan and demonstrated an understanding of the instructions.   The patient was advised to call back or seek an in-person evaluation if the symptoms worsen or if the condition fails to improve as anticipated.  Nicki Reaper, NP

## 2022-03-03 ENCOUNTER — Other Ambulatory Visit: Payer: 59

## 2022-03-17 ENCOUNTER — Ambulatory Visit (INDEPENDENT_AMBULATORY_CARE_PROVIDER_SITE_OTHER): Payer: 59 | Admitting: Advanced Practice Midwife

## 2022-03-17 ENCOUNTER — Other Ambulatory Visit: Payer: Self-pay

## 2022-03-17 VITALS — BP 121/77 | HR 97 | Wt 218.0 lb

## 2022-03-17 DIAGNOSIS — O99210 Obesity complicating pregnancy, unspecified trimester: Secondary | ICD-10-CM

## 2022-03-17 DIAGNOSIS — Z3480 Encounter for supervision of other normal pregnancy, unspecified trimester: Secondary | ICD-10-CM

## 2022-03-17 DIAGNOSIS — Z3A16 16 weeks gestation of pregnancy: Secondary | ICD-10-CM

## 2022-03-17 NOTE — Progress Notes (Signed)
   PRENATAL VISIT NOTE  Subjective:  Gail Perez is a 31 y.o. [id number] at 106w5d being seen today for ongoing prenatal care.  She is currently monitored for the following issues for this low-risk pregnancy and has Supervision of normal pregnancy, antepartum and Obesity in pregnancy, antepartum on their problem list.  Patient reports no complaints.  Contractions: Not present. Vag. Bleeding: None.  Movement: Present. Denies leaking of fluid.   The following portions of the patient's history were reviewed and updated as appropriate: allergies, current medications, past family history, past medical history, past social history, past surgical history and problem list. Problem list updated.  Objective:   Vitals:   03/17/22 0934  BP: 121/77  Pulse: 97  Weight: 218 lb (98.9 kg)    Fetal Status: Fetal Heart Rate (bpm): 145   Movement: Present     General:  Alert, oriented and cooperative. Patient is in no acute distress.  Skin: Skin is warm and dry. No rash noted.   Cardiovascular: Normal heart rate noted  Respiratory: Normal respiratory effort, no problems with respiration noted  Abdomen: Soft, gravid, appropriate for gestational age.  Pain/Pressure: Absent     Pelvic: Cervical exam deferred        Extremities: Normal range of motion.  Edema: None  Mental Status: Normal mood and affect. Normal behavior. Normal judgment and thought content.   Assessment and Plan:  Pregnancy: [id number] at [redacted]w[redacted]d  1. Supervision of other normal pregnancy, antepartum - Routine care, confirmed bASA is advised - Declined AFP - Reviewed doula credentialing program discuss closer to third trimester  2. [redacted] weeks gestation of pregnancy   3. Obesity in pregnancy, antepartum - TWG 16 lbs, BMI 32  Preterm labor symptoms and general obstetric precautions including but not limited to vaginal bleeding, contractions, leaking of fluid and fetal movement were reviewed in detail with the patient. Please refer to After  Visit Summary for other counseling recommendations.  Return in about 4 weeks (around 04/14/2022).  Future Appointments  Date Time Provider Department Center  03/22/2022 11:30 AM [facility]  04/14/2022  1:30 PM Calvert Cantor, CNM [facility]  05/12/2022 10:35 AM Calvert Cantor, CNM [facility]  06/09/2022  8:30 AM [facility] LAB [facility]  06/09/2022  9:15 AM Calvert Cantor, CNM [facility]    Calvert Cantor, CNM

## 2022-03-22 ENCOUNTER — Other Ambulatory Visit: Payer: Self-pay

## 2022-03-22 ENCOUNTER — Other Ambulatory Visit
Admission: RE | Admit: 2022-03-22 | Discharge: 2022-03-22 | Disposition: A | Discharge status: Home / Self Care | Payer: 59 | Source: Ambulatory Visit | Attending: Obstetrics and Gynecology | Admitting: Obstetrics and Gynecology

## 2022-03-22 ENCOUNTER — Ambulatory Visit: Payer: Self-pay

## 2022-03-22 ENCOUNTER — Ambulatory Visit: Payer: 59

## 2022-03-22 ENCOUNTER — Ambulatory Visit (HOSPITAL_BASED_OUTPATIENT_CLINIC_OR_DEPARTMENT_OTHER): Payer: 59

## 2022-03-22 DIAGNOSIS — Z3A17 17 weeks gestation of pregnancy: Secondary | ICD-10-CM | POA: Diagnosis not present

## 2022-03-22 DIAGNOSIS — Z363 Encounter for antenatal screening for malformations: Secondary | ICD-10-CM | POA: Insufficient documentation

## 2022-03-22 DIAGNOSIS — E669 Obesity, unspecified: Secondary | ICD-10-CM

## 2022-03-22 DIAGNOSIS — Z148 Genetic carrier of other disease: Secondary | ICD-10-CM

## 2022-03-22 DIAGNOSIS — Z3A12 12 weeks gestation of pregnancy: Secondary | ICD-10-CM

## 2022-03-22 DIAGNOSIS — O285 Abnormal chromosomal and genetic finding on antenatal screening of mother: Secondary | ICD-10-CM

## 2022-03-22 DIAGNOSIS — O99212 Obesity complicating pregnancy, second trimester: Secondary | ICD-10-CM | POA: Diagnosis present

## 2022-03-22 DIAGNOSIS — Z3402 Encounter for supervision of normal first pregnancy, second trimester: Secondary | ICD-10-CM | POA: Insufficient documentation

## 2022-03-22 DIAGNOSIS — O321XX0 Maternal care for breech presentation, not applicable or unspecified: Secondary | ICD-10-CM | POA: Insufficient documentation

## 2022-03-22 DIAGNOSIS — O99210 Obesity complicating pregnancy, unspecified trimester: Secondary | ICD-10-CM

## 2022-03-22 DIAGNOSIS — Z1379 Encounter for other screening for genetic and chromosomal anomalies: Secondary | ICD-10-CM

## 2022-03-22 NOTE — Progress Notes (Signed)
Referring provider:  [facility] Length of consultation:  30 minutes  Ms. 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  We reviewed those results as well as additional screening and testing options for this pregnancy.   SMA is a recessive genetic condition with variable age of onset and severity caused by mutations in the SMN1 gene.  To review, a recessive condition occurs in a child when both the mother and the father are carriers of the genetic condition and both pass on that altered gene to the child.  The features of SMA are caused by the loss of motor neurons which leads to progressive muscle weakness and muscle wasting (atrophy) in infancy.  There are several types of SMA based upon severity, but in the most common type (type 1), children may pass away as early as 22 years of age.  Carrier testing assesses the number of copies of the SMN1 gene, from zero to four.  Persons with one copy of the SMN1 gene are carriers, and those with no copies are affected with the condition.  Individuals with two or more copies have a reduced chance to be a carrier.  Persons with 3 or 4 copies of this gene are highly unlikely to be carriers.  However, someone with two copies of the gene can either have two copies of this gene on separate chromosomes and thus not be a carrier or they could have two copies of this gene on the same chromosome with no copies on the other chromosome and be a "silent" carrier.  This silent carrier status is known to be more common in persons of African American or Ashkenazi Jewish ancestry, particularly when they are found to be positive for a variant in the gene known as c. *3+80T>G. 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   This testing cannot confirm or eliminate the chance to have a child with SMA. It is estimated that current carrier screening can detect 94.8% of carriers in the Caucasian population and 70.5% of carriers in the African American population.    Then next option we reviewed is to have her partner tested to determine his status.  If he is found to be a carrier, then the pregnancy may be at increased risk for SMA and testing would be offered during the pregnancy through amniocentesis or at the time of birth. At this time, prior to testing, Algie Coffer has a chance of 1 in 49 to be a carrier given his African American ancestry and no known family history of SMA.  Overall, the chance for this couple to have a child with SMA at this time is 1/72 X 1/34 X = 1 in 9,792. If he were to have testing that showed a low chance for him to be a carrier, this number could be decreased significantly.  If his testing were to show that he is a carrier, then the number would be increased.   We also talked about the possibility of testing at the time of delivery. Beginning in 2021, all newborn babies in [place] are test for SMA as well as numerous other conditions as part of state mandated newborn screening. Testing on cord blood or of the baby sometime  after delivery could also be ordered if desired.  Lastly, we reviewed the results of the other testing performed by her OB for screening.  The results of the CF and hemoglobinopathy screening were negative, thus greatly reducing the chance to have a child with either of these conditions.  Panorama testing was ordered by her OB and was low risk for chromosome conditions (trisomy 70, 28, 93 and sex chromosome aneuploidies). See the reports for details.  We also obtained a detailed family history and pregnancy history.  The family history is unremarkable for birth defects, intellectual disabilities, muscle weakness conditions, recurrent pregnancy loss, early death or known genetic conditions.  This is the fifth pregnancy for this patient, the fourth with her current partner.  The patient reported no complications or exposures in this pregnancy that would be expected to increase the risk for birth defects. The couple has a healthy 5 year old son and 69 year old daughter together and had one early miscarriage.  The patient has a 55 year old son from a prior relationship who is also in good health.  Thank you for allowing [place] to be involved in the care of this patient.  We encouraged her to call with any questions or concerns as they arise.  We may be reached at [phone].  Plan of care:  Patient and FOB to determine if they desire his testing. If so, she will call me to place orders and make arrangements. If they decide not to have FOB tested, then patient plans to follow up with newborn screening results at the time of delivery. AFP only screening drawn today following ultrasound.  Follow up ultrasound scheduled in 4 weeks to complete anatomy, as the patient was 17 weeks at today's scan.  See report for details.   Cherly Anderson, MS, CGC

## 2022-03-31 ENCOUNTER — Telehealth: Payer: Self-pay | Admitting: Obstetrics and Gynecology

## 2022-03-31 LAB — MISC LABCORP TEST (SEND OUT): Labcorp test code: 10801

## 2022-03-31 NOTE — Telephone Encounter (Signed)
Ms. Sargeant elected to have maternal serum AFP only screening for open neural tube defects.  The results of this screening are within normal limits with a resulting risk for open spina bifida of 1 in 10,000.  This testing detects greater than 80% of open neural tube defects and when combined with second trimester ultrasound the detection is further increased. It is important to remember that not all birth defects can be detected prenatally.  Results were given to the patient by phone and all questions were answered. We may be reached at [phone].  Cherly Anderson, MS, CGC

## 2022-04-14 ENCOUNTER — Ambulatory Visit (INDEPENDENT_AMBULATORY_CARE_PROVIDER_SITE_OTHER): Payer: 59 | Admitting: Advanced Practice Midwife

## 2022-04-14 ENCOUNTER — Other Ambulatory Visit: Payer: Self-pay

## 2022-04-14 VITALS — BP 121/76 | HR 90 | Wt 223.0 lb

## 2022-04-14 DIAGNOSIS — O99210 Obesity complicating pregnancy, unspecified trimester: Secondary | ICD-10-CM

## 2022-04-14 DIAGNOSIS — Z3A20 20 weeks gestation of pregnancy: Secondary | ICD-10-CM

## 2022-04-14 DIAGNOSIS — Z3480 Encounter for supervision of other normal pregnancy, unspecified trimester: Secondary | ICD-10-CM

## 2022-04-14 NOTE — Progress Notes (Signed)
   PRENATAL VISIT NOTE  Subjective:  Gail Perez is a 31 y.o. G5P3013 at [redacted]w[redacted]d being seen today for ongoing prenatal care.  She is currently monitored for the following issues for this low-risk pregnancy and has Supervision of normal pregnancy, antepartum and Obesity in pregnancy, antepartum on their problem list.  Patient reports no complaints.  Contractions: Not present. Vag. Bleeding: None.  Movement: Present. Denies leaking of fluid.   The following portions of the patient's history were reviewed and updated as appropriate: allergies, current medications, past family history, past medical history, past social history, past surgical history and problem list. Problem list updated.  Objective:   Vitals:   04/14/22 1332  BP: 121/76  Pulse: 90  Weight: 223 lb (101.2 kg)    Fetal Status: Fetal Heart Rate (bpm): 144   Movement: Present     General:  Alert, oriented and cooperative. Patient is in no acute distress.  Skin: Skin is warm and dry. No rash noted.   Cardiovascular: Normal heart rate noted  Respiratory: Normal respiratory effort, no problems with respiration noted  Abdomen: Soft, gravid, appropriate for gestational age.  Pain/Pressure: Absent     Pelvic: Cervical exam deferred        Extremities: Normal range of motion.  Edema: None  Mental Status: Normal mood and affect. Normal behavior. Normal judgment and thought content.   Assessment and Plan:  Pregnancy: C1Y6063 at [redacted]w[redacted]d  1. Supervision of other normal pregnancy, antepartum - Routine care - Taking daily bASA 81 mg  2. [redacted] weeks gestation of pregnancy   Preterm labor symptoms and general obstetric precautions including but not limited to vaginal bleeding, contractions, leaking of fluid and fetal movement were reviewed in detail with the patient. Please refer to After Visit Summary for other counseling recommendations.  Return in about 4 weeks (around 05/12/2022) for MD or APP.  Future Appointments  Date Time  Provider Department Center  04/19/2022 11:00 AM [facility]  05/12/2022 10:35 AM Calvert Cantor, CNM [facility]  06/09/2022  8:30 AM [facility] LAB [facility]  06/09/2022  9:15 AM Calvert Cantor, CNM [facility]    Calvert Cantor, CNM

## 2022-04-14 NOTE — Progress Notes (Signed)
ROB [redacted]w[redacted]d  CC: None

## 2022-04-19 ENCOUNTER — Other Ambulatory Visit: Payer: Self-pay

## 2022-04-19 ENCOUNTER — Ambulatory Visit: Payer: BC Managed Care – PPO | Attending: Maternal & Fetal Medicine

## 2022-04-19 VITALS — BP 126/73 | HR 87 | Temp 98.1°F | Ht 69.0 in | Wt 225.0 lb

## 2022-04-19 DIAGNOSIS — Z3A21 21 weeks gestation of pregnancy: Secondary | ICD-10-CM

## 2022-04-19 DIAGNOSIS — E669 Obesity, unspecified: Secondary | ICD-10-CM

## 2022-04-19 DIAGNOSIS — O285 Abnormal chromosomal and genetic finding on antenatal screening of mother: Secondary | ICD-10-CM

## 2022-04-19 DIAGNOSIS — Z3480 Encounter for supervision of other normal pregnancy, unspecified trimester: Secondary | ICD-10-CM

## 2022-04-19 DIAGNOSIS — Z148 Genetic carrier of other disease: Secondary | ICD-10-CM

## 2022-04-19 DIAGNOSIS — O99212 Obesity complicating pregnancy, second trimester: Secondary | ICD-10-CM | POA: Diagnosis not present

## 2022-04-19 DIAGNOSIS — O99210 Obesity complicating pregnancy, unspecified trimester: Secondary | ICD-10-CM

## 2022-05-03 ENCOUNTER — Encounter: Payer: Self-pay | Admitting: Advanced Practice Midwife

## 2022-05-12 ENCOUNTER — Encounter: Payer: Self-pay | Admitting: Advanced Practice Midwife

## 2022-05-12 ENCOUNTER — Ambulatory Visit (INDEPENDENT_AMBULATORY_CARE_PROVIDER_SITE_OTHER): Payer: BC Managed Care – PPO | Admitting: Advanced Practice Midwife

## 2022-05-12 VITALS — BP 123/77 | HR 80 | Wt 226.0 lb

## 2022-05-12 DIAGNOSIS — Z3482 Encounter for supervision of other normal pregnancy, second trimester: Secondary | ICD-10-CM

## 2022-05-12 DIAGNOSIS — Z3480 Encounter for supervision of other normal pregnancy, unspecified trimester: Secondary | ICD-10-CM

## 2022-05-12 DIAGNOSIS — Z3A24 24 weeks gestation of pregnancy: Secondary | ICD-10-CM

## 2022-05-12 NOTE — Progress Notes (Signed)
   PRENATAL VISIT NOTE  Subjective:  Gail Perez is a 31 y.o. G5P3013 at [redacted]w[redacted]d being seen today for ongoing prenatal care.  She is currently monitored for the following issues for this low-risk pregnancy and has Supervision of normal pregnancy, antepartum and Obesity in pregnancy, antepartum on their problem list.  Patient reports no complaints.  Contractions: Not present. Vag. Bleeding: None.  Movement: Present. Denies leaking of fluid.   The following portions of the patient's history were reviewed and updated as appropriate: allergies, current medications, past family history, past medical history, past social history, past surgical history and problem list. Problem list updated.  Objective:   Vitals:   05/12/22 1051  BP: 123/77  Pulse: 80  Weight: 226 lb (102.5 kg)    Fetal Status: Fetal Heart Rate (bpm): 134   Movement: Present     General:  Alert, oriented and cooperative. Patient is in no acute distress.  Skin: Skin is warm and dry. No rash noted.   Cardiovascular: Normal heart rate noted  Respiratory: Normal respiratory effort, no problems with respiration noted  Abdomen: Soft, gravid, appropriate for gestational age.  Pain/Pressure: Absent     Pelvic: Cervical exam deferred        Extremities: Normal range of motion.  Edema: Trace  Mental Status: Normal mood and affect. Normal behavior. Normal judgment and thought content.   Assessment and Plan:  Pregnancy: H4T6546 at [redacted]w[redacted]d  1. Supervision of other normal pregnancy, antepartum - routine care - Fasting GTT, offer TDAP next visit - Interested in doula  will message coordinator next visit at 28 weeks  2. [redacted] weeks gestation of pregnancy   Preterm labor symptoms and general obstetric precautions including but not limited to vaginal bleeding, contractions, leaking of fluid and fetal movement were reviewed in detail with the patient. Please refer to After Visit Summary for other counseling recommendations.  Return in  about 4 weeks (around 06/09/2022) for Fasting GTT next visit.  Future Appointments  Date Time Provider [place]  06/09/2022  8:30 AM [facility]  06/09/2022  9:15 AM Darlina Rumpf, CNM [facility]  06/23/2022  2:30 PM Darlina Rumpf, CNM [facility]  07/07/2022  2:30 PM Darlina Rumpf, CNM [facility]  07/21/2022  2:30 PM Darlina Rumpf, CNM [facility]    Darlina Rumpf, [place]

## 2022-06-09 ENCOUNTER — Ambulatory Visit (INDEPENDENT_AMBULATORY_CARE_PROVIDER_SITE_OTHER): Payer: BC Managed Care – PPO | Admitting: Advanced Practice Midwife

## 2022-06-09 ENCOUNTER — Other Ambulatory Visit: Payer: BC Managed Care – PPO

## 2022-06-09 ENCOUNTER — Encounter: Payer: Self-pay | Admitting: Advanced Practice Midwife

## 2022-06-09 DIAGNOSIS — Z3480 Encounter for supervision of other normal pregnancy, unspecified trimester: Secondary | ICD-10-CM | POA: Diagnosis not present

## 2022-06-09 DIAGNOSIS — R0981 Nasal congestion: Secondary | ICD-10-CM

## 2022-06-09 DIAGNOSIS — Z23 Encounter for immunization: Secondary | ICD-10-CM

## 2022-06-09 DIAGNOSIS — Z3A28 28 weeks gestation of pregnancy: Secondary | ICD-10-CM

## 2022-06-09 DIAGNOSIS — Z3483 Encounter for supervision of other normal pregnancy, third trimester: Secondary | ICD-10-CM

## 2022-06-09 DIAGNOSIS — R110 Nausea: Secondary | ICD-10-CM

## 2022-06-09 MED ORDER — DIPHENHYDRAMINE-APAP (SLEEP) 25-500 MG PO TABS: 1.0000 | ORAL_TABLET | Freq: Every evening | ORAL | 0 refills | Status: DC | PRN

## 2022-06-09 NOTE — Progress Notes (Signed)
   PRENATAL VISIT NOTE  Subjective:  Gail Perez is a 31 y.o. G5P3013 at [redacted]w[redacted]d being seen today for ongoing prenatal care.  She is currently monitored for the following issues for this low-risk pregnancy and has Supervision of normal pregnancy, antepartum and Obesity in pregnancy, antepartum on their problem list.  Patient reports  persistent nausea for the past few days. She reports intermittent sinus congestion which is typical for her during changing seasons. .   .  .   . Denies leaking of fluid.   The following portions of the patient's history were reviewed and updated as appropriate: allergies, current medications, past family history, past medical history, past social history, past surgical history and problem list. Problem list updated.  Objective:  There were no vitals filed for this visit.  Fetal Status:   Fundal Height: 30 cm       General:  Alert, oriented and cooperative. Patient is in no acute distress.  Skin: Skin is warm and dry. No rash noted.   Cardiovascular: Normal heart rate noted  Respiratory: Normal respiratory effort, no problems with respiration noted  Abdomen: Soft, gravid, appropriate for gestational age.        Pelvic: Cervical exam deferred        Extremities: Normal range of motion.  Edema: Trace  Mental Status: Normal mood and affect. Normal behavior. Normal judgment and thought content.   Assessment and Plan:  Pregnancy: G6Y6948 at [redacted]w[redacted]d  1. Supervision of other normal pregnancy, antepartum - Routine care, no hx of GDM with previous pregnancies, normal HgbA1C 02/15/2022 - Glucose Tolerance, 2 Hours w/1 Hour - CBC - RPR - HIV Antibody (routine testing w rflx)  2. Sinus congestion - Reviewed pregnancy-safe options for treatment - diphenhydramine-acetaminophen (TYLENOL PM) 25-500 MG TABS tablet; Take 1 tablet by mouth at bedtime as needed.  Dispense: 30 tablet; Refill: 0  3. Nausea during pregnancy - not vomiting, tolerating PO intake  4. [redacted] weeks  gestation of pregnancy   Preterm labor symptoms and general obstetric precautions including but not limited to vaginal bleeding, contractions, leaking of fluid and fetal movement were reviewed in detail with the patient. Please refer to After Visit Summary for other counseling recommendations.  Return in about 2 weeks (around 06/23/2022) for MD or APP.  Future Appointments  Date Time Provider [place]  06/23/2022  2:30 PM Darlina Rumpf, [place] [facility]  07/07/2022  2:30 PM Darlina Rumpf, CNM [facility]  07/21/2022  2:30 PM Darlina Rumpf, CNM [facility]  08/03/2022  2:30 PM Donnamae Jude, MD [facility]  08/09/2022  2:30 PM Radene Gunning, MD [facility]  08/18/2022  1:30 PM Darlina Rumpf, CNM [facility]    Darlina Rumpf, CNM

## 2022-06-09 NOTE — Progress Notes (Signed)
Tdap given w/o difficulty L Del. Flu vaccine offered; pt declined.

## 2022-06-10 ENCOUNTER — Other Ambulatory Visit: Payer: Self-pay

## 2022-06-10 ENCOUNTER — Encounter: Payer: Self-pay | Admitting: Advanced Practice Midwife

## 2022-06-10 DIAGNOSIS — O99013 Anemia complicating pregnancy, third trimester: Secondary | ICD-10-CM | POA: Insufficient documentation

## 2022-06-10 LAB — CBC
Hematocrit: 34.5 % (ref 34.0–46.6)
Hemoglobin: 11.4 g/dL (ref 11.1–15.9)
MCH: 26.8 pg (ref 26.6–33.0)
MCHC: 33 g/dL (ref 31.5–35.7)
MCV: 81 fL (ref 79–97)
Platelets: 280 10*3/uL (ref 150–450)
RBC: 4.25 x10E6/uL (ref 3.77–5.28)
RDW: 13.1 % (ref 11.7–15.4)
WBC: 10.5 10*3/uL (ref 3.4–10.8)

## 2022-06-10 LAB — GLUCOSE TOLERANCE, 2 HOURS W/ 1HR
Glucose, 1 hour: 125 mg/dL (ref 70–179)
Glucose, 2 hour: 93 mg/dL (ref 70–152)
Glucose, Fasting: 80 mg/dL (ref 70–91)

## 2022-06-10 LAB — RPR: RPR Ser Ql: NONREACTIVE

## 2022-06-10 LAB — HIV ANTIBODY (ROUTINE TESTING W REFLEX): HIV Screen 4th Generation wRfx: NONREACTIVE

## 2022-06-10 MED ORDER — FERROUS SULFATE 325 (65 FE) MG PO TABS: 325.0000 mg | ORAL_TABLET | Freq: Every day | ORAL | 3 refills | Status: DC

## 2022-06-10 NOTE — Progress Notes (Signed)
Rx sent as advised by provider.

## 2022-06-23 ENCOUNTER — Ambulatory Visit (INDEPENDENT_AMBULATORY_CARE_PROVIDER_SITE_OTHER): Payer: BC Managed Care – PPO | Admitting: Advanced Practice Midwife

## 2022-06-23 VITALS — BP 133/77 | HR 90 | Wt 233.0 lb

## 2022-06-23 DIAGNOSIS — Z3A30 30 weeks gestation of pregnancy: Secondary | ICD-10-CM

## 2022-06-23 DIAGNOSIS — R0981 Nasal congestion: Secondary | ICD-10-CM

## 2022-06-23 DIAGNOSIS — Z3480 Encounter for supervision of other normal pregnancy, unspecified trimester: Secondary | ICD-10-CM

## 2022-06-23 NOTE — Progress Notes (Signed)
ROB   CC: None    

## 2022-06-23 NOTE — Progress Notes (Signed)
ROB [redacted]w[redacted]d

## 2022-06-24 NOTE — Progress Notes (Signed)
   PRENATAL VISIT NOTE  Subjective:  Gail Perez is a 31 y.o. G5P3013 at [redacted]w[redacted]d being seen today for ongoing prenatal care.  She is currently monitored for the following issues for this low-risk pregnancy and has Supervision of normal pregnancy, antepartum; Obesity in pregnancy, antepartum; and Anemia in pregnancy, third trimester on their problem list.  Patient reports  ongoing sinus congestion and seasonal allergies. Content to continue management with OTC treatments at this time .  Contractions: Irritability. Vag. Bleeding: None.  Movement: Present. Denies leaking of fluid.   The following portions of the patient's history were reviewed and updated as appropriate: allergies, current medications, past family history, past medical history, past social history, past surgical history and problem list. Problem list updated.  Objective:   Vitals:   06/23/22 1427  BP: 133/77  Pulse: 90  Weight: 233 lb (105.7 kg)    Fetal Status: Fetal Heart Rate (bpm): 136   Movement: Present     General:  Alert, oriented and cooperative. Patient is in no acute distress.  Skin: Skin is warm and dry. No rash noted.   Cardiovascular: Normal heart rate noted  Respiratory: Normal respiratory effort, no problems with respiration noted  Abdomen: Soft, gravid, appropriate for gestational age.  Pain/Pressure: Present     Pelvic: Cervical exam deferred        Extremities: Normal range of motion.  Edema: Mild pitting, slight indentation  Mental Status: Normal mood and affect. Normal behavior. Normal judgment and thought content.   Assessment and Plan:  Pregnancy: Y3K1601 at [redacted]w[redacted]d  1. Supervision of other normal pregnancy, antepartum - Routine care - Email sent to doula coordinator  2. Sinus congestion - Continue OTC treatments as needed - Rx declined today  3. [redacted] weeks gestation of pregnancy   Preterm labor symptoms and general obstetric precautions including but not limited to vaginal bleeding,  contractions, leaking of fluid and fetal movement were reviewed in detail with the patient. Please refer to After Visit Summary for other counseling recommendations.  Return in about 2 weeks (around 07/07/2022) for MD or APP.  Future Appointments  Date Time Provider [place]  07/07/2022  2:30 PM Darlina Rumpf, [place] [facility]  07/21/2022  2:30 PM Darlina Rumpf, CNM [facility]  08/03/2022  2:30 PM Donnamae Jude, MD [facility]  08/09/2022  2:30 PM Radene Gunning, MD [facility]  08/18/2022  1:30 PM Darlina Rumpf, CNM [facility]    Darlina Rumpf, CNM

## 2022-07-07 ENCOUNTER — Ambulatory Visit (INDEPENDENT_AMBULATORY_CARE_PROVIDER_SITE_OTHER): Payer: BC Managed Care – PPO | Admitting: Advanced Practice Midwife

## 2022-07-07 VITALS — BP 119/75 | HR 87 | Wt 240.0 lb

## 2022-07-07 DIAGNOSIS — Z3480 Encounter for supervision of other normal pregnancy, unspecified trimester: Secondary | ICD-10-CM

## 2022-07-07 DIAGNOSIS — K5900 Constipation, unspecified: Secondary | ICD-10-CM

## 2022-07-07 DIAGNOSIS — K649 Unspecified hemorrhoids: Secondary | ICD-10-CM

## 2022-07-07 DIAGNOSIS — O26853 Spotting complicating pregnancy, third trimester: Secondary | ICD-10-CM

## 2022-07-07 DIAGNOSIS — Z3483 Encounter for supervision of other normal pregnancy, third trimester: Secondary | ICD-10-CM

## 2022-07-07 DIAGNOSIS — Z3A32 32 weeks gestation of pregnancy: Secondary | ICD-10-CM

## 2022-07-07 MED ORDER — DOCUSATE SODIUM 100 MG PO CAPS: 100.0000 mg | ORAL_CAPSULE | Freq: Two times a day (BID) | ORAL | 2 refills | Status: DC | PRN

## 2022-07-07 MED ORDER — WITCH HAZEL-GLYCERIN EX PADS: 1.0000 | MEDICATED_PAD | CUTANEOUS | 12 refills | Status: DC | PRN

## 2022-07-07 MED ORDER — POLYETHYLENE GLYCOL 3350 17 G PO PACK: 17.0000 g | PACK | Freq: Every day | ORAL | 0 refills | Status: DC

## 2022-07-07 NOTE — Progress Notes (Signed)
   PRENATAL VISIT NOTE  Subjective:  Gail Perez is a 31 y.o. G5P3013 at [redacted]w[redacted]d being seen today for ongoing prenatal care.  She is currently monitored for the following issues for this low-risk pregnancy and has Supervision of normal pregnancy, antepartum; Obesity in pregnancy, antepartum; and Anemia in pregnancy, third trimester on their problem list.  Patient reports one episode of pink-tinged discharge, resolved without intervention no recurrence.  Contractions: Irritability. Vag. Bleeding: None.  Movement: Present. Denies leaking of fluid.   The following portions of the patient's history were reviewed and updated as appropriate: allergies, current medications, past family history, past medical history, past social history, past surgical history and problem list. Problem list updated.  Objective:   Vitals:   07/07/22 1435  BP: 119/75  Pulse: 87  Weight: 240 lb (108.9 kg)    Fetal Status: Fetal Heart Rate (bpm): 140   Movement: Present     General:  Alert, oriented and cooperative. Patient is in no acute distress.  Skin: Skin is warm and dry. No rash noted.   Cardiovascular: Normal heart rate noted  Respiratory: Normal respiratory effort, no problems with respiration noted  Abdomen: Soft, gravid, appropriate for gestational age.  Pain/Pressure: Present     Pelvic: Cervical exam deferred        Extremities: Normal range of motion.  Edema: Moderate pitting, indentation subsides rapidly  Mental Status: Normal mood and affect. Normal behavior. Normal judgment and thought content.   Assessment and Plan:  Pregnancy: P3A2505 at [redacted]w[redacted]d  1. Supervision of other normal pregnancy, antepartum - Routine care - Message and text re-sent to doula coordinator, response received same day  2. [redacted] weeks gestation of pregnancy   3. Constipation, unspecified constipation type - Declines physical exam - Encouraged Miralax and Colace daily, high fiber diet - docusate sodium (COLACE) 100 MG  capsule; Take 1 capsule (100 mg total) by mouth 2 (two) times daily as needed.  Dispense: 30 capsule; Refill: 2 - polyethylene glycol (MIRALAX) 17 g packet; Take 17 g by mouth daily.  Dispense: 14 each; Refill: 0  4. Hemorrhoids, unspecified hemorrhoid type  - witch hazel-glycerin (TUCKS) pad; Apply 1 Application topically as needed for itching.  Dispense: 40 each; Refill: 12  5. Spotting affecting pregnancy in third trimester - Likely related to constipation, hemorrhoids  Preterm labor symptoms and general obstetric precautions including but not limited to vaginal bleeding, contractions, leaking of fluid and fetal movement were reviewed in detail with the patient. Please refer to After Visit Summary for other counseling recommendations.  Return in about 2 weeks (around 07/21/2022).  Future Appointments  Date Time Provider Department Center  07/21/2022  2:30 PM Calvert Cantor, [place] [facility]  08/03/2022  2:30 PM Reva Bores, MD [facility]  08/09/2022  2:30 PM Milas Hock, MD [facility]  08/18/2022  1:30 PM Calvert Cantor, CNM [facility]  08/25/2022  2:30 PM [place] Bing, MD [facility]    Calvert Cantor, CNM

## 2022-07-07 NOTE — Progress Notes (Signed)
ROB [redacted]w[redacted]d   Last week pt noticed pink spotting last week none since.   Pt also noticed bleeding from rectum from constipation pt denies exam.  Pt advised on colace , tucks and increasing water and green leafy foods and fiber.

## 2022-07-08 DIAGNOSIS — F4310 Post-traumatic stress disorder, unspecified: Secondary | ICD-10-CM | POA: Diagnosis not present

## 2022-07-21 ENCOUNTER — Ambulatory Visit (INDEPENDENT_AMBULATORY_CARE_PROVIDER_SITE_OTHER): Payer: BC Managed Care – PPO | Admitting: Advanced Practice Midwife

## 2022-07-21 VITALS — BP 137/78 | HR 96 | Wt 242.0 lb

## 2022-07-21 DIAGNOSIS — Z3493 Encounter for supervision of normal pregnancy, unspecified, third trimester: Secondary | ICD-10-CM

## 2022-07-21 DIAGNOSIS — Z3490 Encounter for supervision of normal pregnancy, unspecified, unspecified trimester: Secondary | ICD-10-CM

## 2022-07-21 DIAGNOSIS — O479 False labor, unspecified: Secondary | ICD-10-CM

## 2022-07-21 DIAGNOSIS — Z3A34 34 weeks gestation of pregnancy: Secondary | ICD-10-CM

## 2022-07-22 DIAGNOSIS — F4310 Post-traumatic stress disorder, unspecified: Secondary | ICD-10-CM | POA: Diagnosis not present

## 2022-07-22 NOTE — Progress Notes (Signed)
   PRENATAL VISIT NOTE  Subjective:  Gail Perez is a 31 y.o. [id number] at [redacted]w[redacted]d being seen today for ongoing prenatal care.  She is currently monitored for the following issues for this low-risk pregnancy and has Supervision of normal pregnancy, antepartum; Obesity in pregnancy, antepartum; and Anemia in pregnancy, third trimester on their problem list.  Patient reports  period of persistent painful contractions, about 5 in one hour then resolved .  Contractions: Irregular. Vag. Bleeding: None.  Movement: Present. Denies leaking of fluid.   The following portions of the patient's history were reviewed and updated as appropriate: allergies, current medications, past family history, past medical history, past social history, past surgical history and problem list. Problem list updated.  Objective:   Vitals:   07/21/22 1424  BP: 137/78  Pulse: 96  Weight: 242 lb (109.8 kg)    Fetal Status: Fetal Heart Rate (bpm): 138 Fundal Height: 35 cm Movement: Present     General:  Alert, oriented and cooperative. Patient is in no acute distress.  Skin: Skin is warm and dry. No rash noted.   Cardiovascular: Normal heart rate noted  Respiratory: Normal respiratory effort, no problems with respiration noted  Abdomen: Soft, gravid, appropriate for gestational age.  Pain/Pressure: Present     Pelvic: Cervical exam performed Dilation: Fingertip/thick/posterior   Extremities: Normal range of motion.  Edema: None  Mental Status: Normal mood and affect. Normal behavior. Normal judgment and thought content.   Assessment and Plan:  Pregnancy: A5W0981 at [redacted]w[redacted]d  1. Encounter for supervision of normal pregnancy, antepartum, unspecified gravidity - Routine care - GBS and GC next visit  2. Braxton Hick's contraction - FT/thick/posterior today - Reviewed preterm labor precautions, low threshold for evaluation in MAU if return of  - Discussed ACOG recommendation for labor at 35-36 weeks vs less than 35 weeks  due to developmental differences and overall resilience  3. [redacted] weeks gestation of pregnancy   Preterm labor symptoms and general obstetric precautions including but not limited to vaginal bleeding, contractions, leaking of fluid and fetal movement were reviewed in detail with the patient. Please refer to After Visit Summary for other counseling recommendations.  Return in about 2 weeks (around 08/04/2022) for MD or APP.  Future Appointments  Date Time Provider Department Center  08/03/2022  2:30 PM Reva Bores, MD [facility]  08/09/2022  2:30 PM Milas Hock, MD [facility]  08/18/2022  1:30 PM Calvert Cantor, CNM [facility]  08/25/2022  2:30 PM [place] Bing, MD [facility]    Calvert Cantor, CNM

## 2022-07-25 ENCOUNTER — Encounter: Payer: Self-pay | Admitting: Advanced Practice Midwife

## 2022-07-26 ENCOUNTER — Encounter: Payer: Self-pay | Admitting: Advanced Practice Midwife

## 2022-07-29 DIAGNOSIS — F4310 Post-traumatic stress disorder, unspecified: Secondary | ICD-10-CM | POA: Diagnosis not present

## 2022-08-01 ENCOUNTER — Encounter: Payer: Self-pay | Admitting: Obstetrics & Gynecology

## 2022-08-03 ENCOUNTER — Encounter: Payer: BC Managed Care – PPO | Admitting: Family Medicine

## 2022-08-03 ENCOUNTER — Encounter: Payer: Self-pay | Admitting: Family Medicine

## 2022-08-03 NOTE — Progress Notes (Signed)
Patient did not keep appointment today. She will be called to reschedule.  

## 2022-08-04 ENCOUNTER — Encounter: Payer: Self-pay | Admitting: Advanced Practice Midwife

## 2022-08-05 ENCOUNTER — Encounter: Payer: Self-pay | Admitting: Advanced Practice Midwife

## 2022-08-05 DIAGNOSIS — F4310 Post-traumatic stress disorder, unspecified: Secondary | ICD-10-CM | POA: Diagnosis not present

## 2022-08-09 ENCOUNTER — Other Ambulatory Visit (HOSPITAL_COMMUNITY)
Admission: RE | Admit: 2022-08-09 | Discharge: 2022-08-09 | Disposition: A | Discharge status: Home / Self Care | Payer: BC Managed Care – PPO | Source: Ambulatory Visit | Attending: Obstetrics and Gynecology | Admitting: Obstetrics and Gynecology

## 2022-08-09 ENCOUNTER — Encounter: Payer: Self-pay | Admitting: Obstetrics and Gynecology

## 2022-08-09 ENCOUNTER — Ambulatory Visit (INDEPENDENT_AMBULATORY_CARE_PROVIDER_SITE_OTHER): Payer: BC Managed Care – PPO | Admitting: Obstetrics and Gynecology

## 2022-08-09 VITALS — BP 121/74 | HR 88 | Wt 242.0 lb

## 2022-08-09 DIAGNOSIS — Z3480 Encounter for supervision of other normal pregnancy, unspecified trimester: Secondary | ICD-10-CM | POA: Insufficient documentation

## 2022-08-09 DIAGNOSIS — Z3A00 Weeks of gestation of pregnancy not specified: Secondary | ICD-10-CM | POA: Insufficient documentation

## 2022-08-09 DIAGNOSIS — O99213 Obesity complicating pregnancy, third trimester: Secondary | ICD-10-CM

## 2022-08-09 DIAGNOSIS — Z3483 Encounter for supervision of other normal pregnancy, third trimester: Secondary | ICD-10-CM

## 2022-08-09 DIAGNOSIS — Z3A37 37 weeks gestation of pregnancy: Secondary | ICD-10-CM

## 2022-08-09 DIAGNOSIS — O99013 Anemia complicating pregnancy, third trimester: Secondary | ICD-10-CM

## 2022-08-09 DIAGNOSIS — O99210 Obesity complicating pregnancy, unspecified trimester: Secondary | ICD-10-CM

## 2022-08-09 DIAGNOSIS — B379 Candidiasis, unspecified: Secondary | ICD-10-CM

## 2022-08-09 NOTE — Progress Notes (Signed)
ROB [redacted]w[redacted]d  CC: vaginal irritation has tried OTC monistat notes little relief.   Pt wants cervix check and would like to screen for BV and yeast as well.

## 2022-08-09 NOTE — Progress Notes (Signed)
   PRENATAL VISIT NOTE  Subjective:  Gail Perez is a 31 y.o. G5P3013 at [redacted]w[redacted]d being seen today for ongoing prenatal care.  She is currently monitored for the following issues for this low-risk pregnancy and has Supervision of normal pregnancy, antepartum; Obesity in pregnancy, antepartum; and Anemia in pregnancy, third trimester on their problem list.  Patient reports vaginal irritation - some relief from OTC meds.  Contractions: Irritability. Vag. Bleeding: None.  Movement: Present. Denies leaking of fluid.   The following portions of the patient's history were reviewed and updated as appropriate: allergies, current medications, past family history, past medical history, past social history, past surgical history and problem list.   Objective:   Vitals:   08/09/22 1443  BP: 121/74  Pulse: 88  Weight: 242 lb (109.8 kg)    Fetal Status: Fetal Heart Rate (bpm): 137 Fundal Height: 37 cm Movement: Present     General:  Alert, oriented and cooperative. Patient is in no acute distress.  Skin: Skin is warm and dry. No rash noted.   Cardiovascular: Normal heart rate noted  Respiratory: Normal respiratory effort, no problems with respiration noted  Abdomen: Soft, gravid, appropriate for gestational age.  Pain/Pressure: Present     Pelvic: Cervical exam performed in the presence of a chaperone Dilation: 2 Effacement (%): 60 Station: -2  Extremities: Normal range of motion.  Edema: Moderate pitting, indentation subsides rapidly  Mental Status: Normal mood and affect. Normal behavior. Normal judgment and thought content.   Assessment and Plan:  Pregnancy: [id number] at [redacted]w[redacted]d 1. Supervision of other normal pregnancy, antepartum GBS done today Box up to date Vaginitis panel done today  2. Obesity in pregnancy, antepartum  3. Anemia in pregnancy, third trimester No issue this pregnancy. HgB is 11.4. May continue Iron to prevent.   Term labor symptoms and general obstetric precautions  including but not limited to vaginal bleeding, contractions, leaking of fluid and fetal movement were reviewed in detail with the patient. Please refer to After Visit Summary for other counseling recommendations.   Return in about 1 week (around 08/16/2022) for OB VISIT, MD or APP.  Future Appointments  Date Time Provider Department Center  08/18/2022  1:30 PM [place] [facility]  08/25/2022  2:30 PM [place] Bing, MD [facility]    Milas Hock, MD

## 2022-08-11 LAB — CERVICOVAGINAL ANCILLARY ONLY
Bacterial Vaginitis (gardnerella): NEGATIVE
Candida Glabrata: NEGATIVE
Candida Vaginitis: POSITIVE — AB
Chlamydia: NEGATIVE
Comment: NEGATIVE
Comment: NEGATIVE
Comment: NEGATIVE
Comment: NEGATIVE
Comment: NORMAL
Neisseria Gonorrhea: NEGATIVE

## 2022-08-11 LAB — STREP GP B NAA: Strep Gp B NAA: NEGATIVE

## 2022-08-12 MED ORDER — FLUCONAZOLE 150 MG PO TABS: 150.0000 mg | ORAL_TABLET | ORAL | 3 refills | Status: DC

## 2022-08-12 NOTE — Addendum Note (Signed)
Addended by: Milas Hock A on: 08/12/2022 08:32 AM   Modules accepted: Orders

## 2022-08-18 ENCOUNTER — Ambulatory Visit (INDEPENDENT_AMBULATORY_CARE_PROVIDER_SITE_OTHER): Payer: BC Managed Care – PPO | Admitting: Advanced Practice Midwife

## 2022-08-18 VITALS — BP 132/84 | HR 79 | Wt 245.0 lb

## 2022-08-18 DIAGNOSIS — O99210 Obesity complicating pregnancy, unspecified trimester: Secondary | ICD-10-CM

## 2022-08-18 DIAGNOSIS — Z3480 Encounter for supervision of other normal pregnancy, unspecified trimester: Secondary | ICD-10-CM

## 2022-08-18 DIAGNOSIS — O99013 Anemia complicating pregnancy, third trimester: Secondary | ICD-10-CM

## 2022-08-18 DIAGNOSIS — Z3A38 38 weeks gestation of pregnancy: Secondary | ICD-10-CM

## 2022-08-18 DIAGNOSIS — O99213 Obesity complicating pregnancy, third trimester: Secondary | ICD-10-CM

## 2022-08-18 DIAGNOSIS — Z3483 Encounter for supervision of other normal pregnancy, third trimester: Secondary | ICD-10-CM

## 2022-08-18 NOTE — Progress Notes (Signed)
   PRENATAL VISIT NOTE  Subjective:  Gail Perez is a 31 y.o. G5P3013 at [redacted]w[redacted]d being seen today for ongoing prenatal care.  She is currently monitored for the following issues for this low-risk pregnancy and has Supervision of normal pregnancy, antepartum; Obesity in pregnancy, antepartum; and Anemia in pregnancy, third trimester on their problem list.  Patient reports  irregular contractions, requesting cervical exam .  Contractions: Irritability. Vag. Bleeding: None.  Movement: Present. Denies leaking of fluid.   Patient is agreeable to scheduling induction of labor (IOL). She states she has been induced with all her children.  The following portions of the patient's history were reviewed and updated as appropriate: allergies, current medications, past family history, past medical history, past social history, past surgical history and problem list. Problem list updated.  Objective:   Vitals:   08/18/22 1342  BP: 132/84  Pulse: 79  Weight: 245 lb (111.1 kg)    Fetal Status: Fetal Heart Rate (bpm): 128 Fundal Height: 39 cm Movement: Present     General:  Alert, oriented and cooperative. Patient is in no acute distress.  Skin: Skin is warm and dry. No rash noted.   Cardiovascular: Normal heart rate noted  Respiratory: Normal respiratory effort, no problems with respiration noted  Abdomen: Soft, gravid, appropriate for gestational age.  Pain/Pressure: Present     Pelvic: Cervical exam performed Dilation: 2 Effacement (%): Thick    Extremities: Normal range of motion.  Edema: Mild pitting, slight indentation  Mental Status: Normal mood and affect. Normal behavior. Normal judgment and thought content.   Assessment and Plan:  Pregnancy: W4X3244 at [redacted]w[redacted]d  1. Supervision of other normal pregnancy, antepartum - Routine care - Reviewed typical hospital course, postpartum stay - IOL scheduled for midnight 08/27/2022, eat before arriving due to few options overnight on weekend :)  2.  Labor and delivery indication for care or intervention - Orders placed - CBC; Standing - Type and screen; Standing - RPR; Standing  3. Anemia in pregnancy, third trimester - PO Iron - 11.4 on 06/09/2022, asymptomatic  4. Obesity in pregnancy, antepartum - TWG 43 lbs - FH appropriate for GA - Proven pelvis 8lb 1.1 oz   5. [redacted] weeks gestation of pregnancy   Term labor symptoms and general obstetric precautions including but not limited to vaginal bleeding, contractions, leaking of fluid and fetal movement were reviewed in detail with the patient. Please refer to After Visit Summary for other counseling recommendations.    Future Appointments  Date Time Provider Department Center  08/25/2022  2:30 PM [place] Bing, MD [facility]  08/27/2022 12:00 AM [facility] SCHED ROOM [facility] None    Calvert Cantor, CNM

## 2022-08-18 NOTE — Progress Notes (Signed)
ROB [redacted]w[redacted]d  Pt would like cervix check.

## 2022-08-18 NOTE — Patient Instructions (Signed)
Congratulations, you're on your way to having your baby!!!   You now have an induction scheduled.   If your induction is scheduled for midnight, you will arrive at 11:45pm on the date provided on the form the clinical staff today. You will arrive at the [facility] (Entrance C) and tell them you are there for your induction. The hospital will only call you to not come if they have NO beds available and need to postpone your admission time.    You will also get a call from the pre-admission nurse to go over pre-admission screen about 2 days prior to your induction date.

## 2022-08-19 ENCOUNTER — Telehealth (HOSPITAL_COMMUNITY): Payer: Self-pay | Admitting: *Deleted

## 2022-08-19 ENCOUNTER — Encounter (HOSPITAL_COMMUNITY): Payer: Self-pay

## 2022-08-19 ENCOUNTER — Encounter: Payer: Self-pay | Admitting: Advanced Practice Midwife

## 2022-08-19 NOTE — Telephone Encounter (Signed)
Preadmission screen  

## 2022-08-22 ENCOUNTER — Other Ambulatory Visit: Payer: Self-pay | Admitting: Advanced Practice Midwife

## 2022-08-22 NOTE — L&D Delivery Note (Signed)
Delivery Note At 41 a viable female infant was delivered via SVD, presentation: ROA. APGAR: 8, 9; weight pending.   Placenta status: spontaneously delivered intact with gentle cord traction. Fundus firm with massage and Pitocin.   Anesthesia: none Lacerations: superficial skin at introitus-hemostatic Est. Blood Loss (mL): 75 Placenta to LD Complications none Cord ph n/a   Mom to postpartum. Baby to Couplet care / Skin to Skin.    Julianne Handler, CNM 08/27/2022 1:48 PM

## 2022-08-23 ENCOUNTER — Telehealth (HOSPITAL_COMMUNITY): Payer: Self-pay | Admitting: *Deleted

## 2022-08-23 ENCOUNTER — Encounter (HOSPITAL_COMMUNITY): Payer: Self-pay | Admitting: *Deleted

## 2022-08-23 NOTE — Telephone Encounter (Signed)
Preadmission screen  

## 2022-08-25 ENCOUNTER — Ambulatory Visit (INDEPENDENT_AMBULATORY_CARE_PROVIDER_SITE_OTHER): Payer: BC Managed Care – PPO | Admitting: Obstetrics and Gynecology

## 2022-08-25 VITALS — BP 135/72 | HR 86 | Wt 244.0 lb

## 2022-08-25 DIAGNOSIS — Z3A39 39 weeks gestation of pregnancy: Secondary | ICD-10-CM

## 2022-08-25 DIAGNOSIS — Z3483 Encounter for supervision of other normal pregnancy, third trimester: Secondary | ICD-10-CM

## 2022-08-25 NOTE — Progress Notes (Signed)
ROB [redacted]w[redacted]d  Pt declines cervix check today.

## 2022-08-25 NOTE — Progress Notes (Signed)
   PRENATAL VISIT NOTE  Subjective:  Gail Perez is a 32 y.o. [id number] at [redacted]w[redacted]d being seen today for ongoing prenatal care.  She is currently monitored for the following issues for this low-risk pregnancy and has Supervision of normal pregnancy, antepartum; Obesity in pregnancy, antepartum; and Anemia in pregnancy, third trimester on their problem list.  Patient reports occasional contractions.  Contractions: Irritability. Vag. Bleeding: None.  Movement: Present. Denies leaking of fluid.   The following portions of the patient's history were reviewed and updated as appropriate: allergies, current medications, past family history, past medical history, past social history, past surgical history and problem list.   Objective:   Vitals:   08/25/22 1436  BP: 135/72  Pulse: 86  Weight: 244 lb (110.7 kg)    Fetal Status: Fetal Heart Rate (bpm): 140   Movement: Present     General:  Alert, oriented and cooperative. Patient is in no acute distress.  Skin: Skin is warm and dry. No rash noted.   Cardiovascular: Normal heart rate noted  Respiratory: Normal respiratory effort, no problems with respiration noted  Abdomen: Soft, gravid, appropriate for gestational age.  Pain/Pressure: Present     Pelvic: Cervical exam deferred        Extremities: Normal range of motion.  Edema: Moderate pitting, indentation subsides rapidly  Mental Status: Normal mood and affect. Normal behavior. Normal judgment and thought content.   Assessment and Plan:  Pregnancy: [id number] at [redacted]w[redacted]d 1. [redacted] weeks gestation of pregnancy Routine care. GBS neg. Pt set up for IOL tomorrow at 2345. Leaning towards IUD at her PP visit.   Term labor symptoms and general obstetric precautions including but not limited to vaginal bleeding, contractions, leaking of fluid and fetal movement were reviewed in detail with the patient. Please refer to After Visit Summary for other counseling recommendations.   No follow-ups on file.  Future  Appointments  Date Time Provider [place]  08/27/2022 12:00 AM [facility] SCHED ROOM [facility] None    Aletha Halim, MD

## 2022-08-27 ENCOUNTER — Inpatient Hospital Stay (HOSPITAL_COMMUNITY): Payer: BC Managed Care – PPO

## 2022-08-27 ENCOUNTER — Encounter (HOSPITAL_COMMUNITY): Payer: Self-pay | Admitting: Family Medicine

## 2022-08-27 ENCOUNTER — Inpatient Hospital Stay (HOSPITAL_COMMUNITY)
Admission: RE | Admit: 2022-08-27 | Discharge: 2022-08-28 | DRG: 807 | Disposition: A | Discharge status: Home / Self Care | Payer: BC Managed Care – PPO | Attending: Obstetrics and Gynecology | Admitting: Obstetrics and Gynecology

## 2022-08-27 ENCOUNTER — Other Ambulatory Visit: Payer: Self-pay

## 2022-08-27 DIAGNOSIS — O480 Post-term pregnancy: Secondary | ICD-10-CM | POA: Diagnosis not present

## 2022-08-27 DIAGNOSIS — Z3A40 40 weeks gestation of pregnancy: Secondary | ICD-10-CM | POA: Diagnosis not present

## 2022-08-27 DIAGNOSIS — O99210 Obesity complicating pregnancy, unspecified trimester: Secondary | ICD-10-CM | POA: Diagnosis present

## 2022-08-27 DIAGNOSIS — O99013 Anemia complicating pregnancy, third trimester: Secondary | ICD-10-CM | POA: Diagnosis present

## 2022-08-27 DIAGNOSIS — Z148 Genetic carrier of other disease: Secondary | ICD-10-CM | POA: Diagnosis not present

## 2022-08-27 DIAGNOSIS — O9902 Anemia complicating childbirth: Secondary | ICD-10-CM | POA: Diagnosis not present

## 2022-08-27 DIAGNOSIS — O26893 Other specified pregnancy related conditions, third trimester: Secondary | ICD-10-CM | POA: Diagnosis not present

## 2022-08-27 DIAGNOSIS — Z3490 Encounter for supervision of normal pregnancy, unspecified, unspecified trimester: Secondary | ICD-10-CM

## 2022-08-27 DIAGNOSIS — Z23 Encounter for immunization: Secondary | ICD-10-CM | POA: Diagnosis not present

## 2022-08-27 LAB — CBC
HCT: 33.2 % — ABNORMAL LOW (ref 36.0–46.0)
Hemoglobin: 10.3 g/dL — ABNORMAL LOW (ref 12.0–15.0)
MCH: 23.8 pg — ABNORMAL LOW (ref 26.0–34.0)
MCHC: 31 g/dL (ref 30.0–36.0)
MCV: 76.9 fL — ABNORMAL LOW (ref 80.0–100.0)
Platelets: 269 10*3/uL (ref 150–400)
RBC: 4.32 MIL/uL (ref 3.87–5.11)
RDW: 15.5 % (ref 11.5–15.5)
WBC: 11.8 10*3/uL — ABNORMAL HIGH (ref 4.0–10.5)
nRBC: 0.2 % (ref 0.0–0.2)

## 2022-08-27 LAB — RPR: RPR Ser Ql: NONREACTIVE

## 2022-08-27 LAB — TYPE AND SCREEN
ABO/RH(D): O POS
Antibody Screen: NEGATIVE

## 2022-08-27 MED ORDER — MISOPROSTOL 25 MCG QUARTER TABLET
25.0000 ug | ORAL_TABLET | Freq: Once | ORAL | Status: AC
  Administered 2022-08-27: 25 ug via VAGINAL
  Filled 2022-08-27: qty 1

## 2022-08-27 MED ORDER — MISOPROSTOL 50MCG HALF TABLET
50.0000 ug | ORAL_TABLET | Freq: Once | ORAL | Status: AC
  Administered 2022-08-27: 50 ug via ORAL
  Filled 2022-08-27: qty 1

## 2022-08-27 MED ORDER — DIBUCAINE (PERIANAL) 1 % EX OINT: 1.0000 | TOPICAL_OINTMENT | CUTANEOUS | Status: DC | PRN

## 2022-08-27 MED ORDER — TRANEXAMIC ACID-NACL 1000-0.7 MG/100ML-% IV SOLN
1000.0000 mg | INTRAVENOUS | Status: AC
  Administered 2022-08-27: 1000 mg via INTRAVENOUS
  Filled 2022-08-27: qty 100

## 2022-08-27 MED ORDER — LACTATED RINGERS IV SOLN: INTRAVENOUS | Status: DC

## 2022-08-27 MED ORDER — SOD CITRATE-CITRIC ACID 500-334 MG/5ML PO SOLN: 30.0000 mL | ORAL | Status: DC | PRN

## 2022-08-27 MED ORDER — DIPHENHYDRAMINE HCL 25 MG PO CAPS: 25.0000 mg | ORAL_CAPSULE | Freq: Four times a day (QID) | ORAL | Status: DC | PRN

## 2022-08-27 MED ORDER — TETANUS-DIPHTH-ACELL PERTUSSIS 5-2.5-18.5 LF-MCG/0.5 IM SUSY: 0.5000 mL | PREFILLED_SYRINGE | Freq: Once | INTRAMUSCULAR | Status: DC

## 2022-08-27 MED ORDER — IBUPROFEN 600 MG PO TABS
600.0000 mg | ORAL_TABLET | Freq: Four times a day (QID) | ORAL | Status: DC
  Administered 2022-08-27 – 2022-08-28 (×4): 600 mg via ORAL
  Filled 2022-08-27 (×4): qty 1

## 2022-08-27 MED ORDER — ACETAMINOPHEN 325 MG PO TABS: 650.0000 mg | ORAL_TABLET | ORAL | Status: DC | PRN

## 2022-08-27 MED ORDER — ONDANSETRON HCL 4 MG PO TABS: 4.0000 mg | ORAL_TABLET | ORAL | Status: DC | PRN

## 2022-08-27 MED ORDER — COCONUT OIL OIL: 1.0000 | TOPICAL_OIL | Status: DC | PRN

## 2022-08-27 MED ORDER — ONDANSETRON HCL 4 MG/2ML IJ SOLN: 4.0000 mg | Freq: Four times a day (QID) | INTRAMUSCULAR | Status: DC | PRN

## 2022-08-27 MED ORDER — PRENATAL MULTIVITAMIN CH
1.0000 | ORAL_TABLET | Freq: Every day | ORAL | Status: DC
  Administered 2022-08-28: 1 via ORAL
  Filled 2022-08-27: qty 1

## 2022-08-27 MED ORDER — LIDOCAINE HCL (PF) 1 % IJ SOLN: 30.0000 mL | INTRAMUSCULAR | Status: DC | PRN

## 2022-08-27 MED ORDER — MEASLES, MUMPS & RUBELLA VAC IJ SOLR: 0.5000 mL | Freq: Once | INTRAMUSCULAR | Status: DC

## 2022-08-27 MED ORDER — ONDANSETRON HCL 4 MG/2ML IJ SOLN: 4.0000 mg | INTRAMUSCULAR | Status: DC | PRN

## 2022-08-27 MED ORDER — OXYTOCIN-SODIUM CHLORIDE 30-0.9 UT/500ML-% IV SOLN
2.5000 [IU]/h | INTRAVENOUS | Status: DC
  Administered 2022-08-27: 2.5 [IU]/h via INTRAVENOUS

## 2022-08-27 MED ORDER — WITCH HAZEL-GLYCERIN EX PADS: 1.0000 | MEDICATED_PAD | CUTANEOUS | Status: DC | PRN

## 2022-08-27 MED ORDER — OXYTOCIN-SODIUM CHLORIDE 30-0.9 UT/500ML-% IV SOLN
1.0000 m[IU]/min | INTRAVENOUS | Status: DC
  Administered 2022-08-27: 2 m[IU]/min via INTRAVENOUS
  Administered 2022-08-27: 4 m[IU]/min via INTRAVENOUS
  Administered 2022-08-27: 6 m[IU]/min via INTRAVENOUS
  Filled 2022-08-27: qty 500

## 2022-08-27 MED ORDER — SIMETHICONE 80 MG PO CHEW: 80.0000 mg | CHEWABLE_TABLET | ORAL | Status: DC | PRN

## 2022-08-27 MED ORDER — TERBUTALINE SULFATE 1 MG/ML IJ SOLN: 0.2500 mg | Freq: Once | INTRAMUSCULAR | Status: DC | PRN

## 2022-08-27 MED ORDER — BENZOCAINE-MENTHOL 20-0.5 % EX AERO
1.0000 | INHALATION_SPRAY | CUTANEOUS | Status: DC | PRN
  Administered 2022-08-27: 1 via TOPICAL
  Filled 2022-08-27: qty 56

## 2022-08-27 MED ORDER — FENTANYL CITRATE (PF) 100 MCG/2ML IJ SOLN
100.0000 ug | INTRAMUSCULAR | Status: DC | PRN
  Administered 2022-08-27: 100 ug via INTRAVENOUS
  Filled 2022-08-27: qty 2

## 2022-08-27 MED ORDER — LACTATED RINGERS IV SOLN: 500.0000 mL | INTRAVENOUS | Status: DC | PRN

## 2022-08-27 MED ORDER — OXYTOCIN BOLUS FROM INFUSION
333.0000 mL | Freq: Once | INTRAVENOUS | Status: AC
  Administered 2022-08-27: 333 mL via INTRAVENOUS

## 2022-08-27 MED ORDER — ACETAMINOPHEN 325 MG PO TABS
650.0000 mg | ORAL_TABLET | ORAL | Status: DC | PRN
  Administered 2022-08-27 – 2022-08-28 (×3): 650 mg via ORAL
  Filled 2022-08-27 (×3): qty 2

## 2022-08-27 MED ORDER — SENNOSIDES-DOCUSATE SODIUM 8.6-50 MG PO TABS
2.0000 | ORAL_TABLET | ORAL | Status: DC
  Filled 2022-08-27 (×2): qty 2

## 2022-08-27 NOTE — H&P (Signed)
OBSTETRIC ADMISSION HISTORY AND PHYSICAL  Gail Perez is a 32 y.o. female [phone] with IUP at [redacted]w[redacted]d by LMP presenting for elective induction. She reports +FMs, No LOF, no VB, no blurry vision, headaches or peripheral edema, and RUQ pain.  She plans on breast and bottle feeding. She request outpatient iodine for birth control. She received her prenatal care at [facility]   Dating: By LMP --->  Estimated Date of Delivery: 08/27/22  Sono:    @21  w 3 d, CWD, normal anatomy, cephalic presentation, 436 g, EFW   Prenatal History/Complications:   Patient Active Problem List   Diagnosis Date Noted   Labor and delivery indication for care or intervention 08/27/2022   Anemia in pregnancy, third trimester 06/10/2022   Supervision of normal pregnancy, antepartum 02/15/2022   Obesity in pregnancy, antepartum 02/15/2022    Nursing Staff Provider  Office Location  [place]  Dating  LMP  [facility] Model [x ] Traditional [ ]  Centering [ ]  Mom-Baby Dyad    Language  English  Anatomy [facility]  Completed  Flu Vaccine  No declined 06/09/22 Genetic/Carrier Screen  NIPS:   Low risk female AFP:    Horizon: Silent carrier for SMA  TDaP Vaccine   06/09/22 Hgb A1C or  GTT Early A1C 5.2 Third trimester   COVID Vaccine 2 doses pfizer   LAB RESULTS   Rhogam   Blood Type O/Positive/-- (06/27 1547)   Baby Feeding Plan Breast and bottle Antibody Negative (06/27 1547)  Contraception Considering IUD at postpartum Rubella 6.85 (06/27 1547)  Circumcision N/A  RPR Non Reactive (10/19 0911)   Pediatrician  [facility] HBsAg Negative (06/27 1547)   Support Person Husband Orion  HCVAb Negative (06/27 1547)  Prenatal Classes no HIV Non Reactive (10/19 0911)     BTL Consent  GBS   (For PCN allergy, check sensitivities)   VBAC Consent  Pap Normal, neg HPV 6/27       DME Rx [x ] BP cuff [ ]  Weight Scale Waterbirth  [ ]  Class [ ]  Consent [ ]  CNM visit  PHQ9 & GAD7 [  ] new OB [x ] 28 weeks  [  ] 36 weeks Induction  [  ] Orders Entered [ ] Foley Y/N    Past Medical History: Past Medical History:  Diagnosis Date   Allergy    Chlamydia    Hx of varicella    Pregnancy induced hypertension 08/22/2012   Seasonal allergies    Urinary tract infection    Vaginal Pap smear, abnormal     Past Surgical History: Past Surgical History:  Procedure Laterality Date   LEEP      Obstetrical History: OB History     Gravida  5   Para  3   Term  3   Preterm  0   AB  1   Living  3      SAB  1   IAB  0   Ectopic  0   Multiple  0   Live Births  3           Social History Social History   Socioeconomic History   Marital status: Married    Spouse name: Orion   Number of children: Not on file   Years of education: Not on file   Highest education level: Not on file  Occupational History   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    Comment: [employer]  Tobacco Use   Smoking status: Never   Smokeless  tobacco: Never  Vaping Use   Vaping Use: Never used  Substance and Sexual Activity   Alcohol use: Not Currently    Comment: occasional   Drug use: No   Sexual activity: Yes    Birth control/protection: Condom  Other Topics Concern   Not on file  Social History Narrative   Not on file   Social Determinants of Health   Financial Resource Strain: Low Risk  (04/30/2018)   Overall Financial Resource Strain (CARDIA)    Difficulty of Paying Living Expenses: Not hard at all  Food Insecurity: No Food Insecurity (08/27/2022)   Hunger Vital Sign    Worried About Running Out of Food in the Last Year: Never true    Ran Out of Food in the Last Year: Never true  Transportation Needs: No Transportation Needs (08/27/2022)   PRAPARE - Administrator, Civil Service (Medical): No    Lack of Transportation (Non-Medical): No  Physical Activity: Inactive (04/30/2018)   Exercise Vital Sign    Days of Exercise per Week: 0 days    Minutes of Exercise per Session: 0 min  Stress: Not on file  Social  Connections: Not on file    Family History: Family History  Problem Relation Age of Onset   Anemia Mother    Stroke Father    Hypertension Father    Hyperlipidemia Father    Heart disease Father        enlatged heart   Sarcoidosis Father    Hypertension Sister    Cancer Maternal Grandfather        lung   Cancer Paternal Grandmother    Other Neg Hx     Allergies: No Known Allergies  Medications Prior to Admission  Medication Sig Dispense Refill Last Dose   diphenhydramine-acetaminophen (TYLENOL PM) 25-500 MG TABS tablet Take 1 tablet by mouth at bedtime as needed. 30 tablet 0    docusate sodium (COLACE) 100 MG capsule Take 1 capsule (100 mg total) by mouth 2 (two) times daily as needed. 30 capsule 2    ferrous sulfate (FERROUSUL) 325 (65 FE) MG tablet Take 1 tablet (325 mg total) by mouth daily with breakfast. Take 1 by mouth every other day 30 tablet 3    fluconazole (DIFLUCAN) 150 MG tablet Take 1 tablet (150 mg total) by mouth every 3 (three) days. For three doses 3 tablet 3    polyethylene glycol (MIRALAX) 17 g packet Take 17 g by mouth daily. 14 each 0    Prenatal Vit-Fe Fumarate-FA (PRENATAL MULTIVITAMIN) TABS tablet Take 1 tablet by mouth daily at 12 noon.      witch hazel-glycerin (TUCKS) pad Apply 1 Application topically as needed for itching. 40 each 12      Review of Systems   All systems reviewed and negative except as stated in HPI  Blood pressure (!) 142/78, pulse 94, temperature 97.7 F (36.5 C), temperature source Oral, resp. rate 18, last menstrual period 11/20/2021, unknown if currently breastfeeding. General appearance: alert, cooperative, and appears stated age Lungs: clear to auscultation bilaterally Heart: regular rate and rhythm Abdomen: soft, non-tender; bowel sounds normal Pelvic: Lesions Extremities: Homans sign is negative, no sign of DVT Presentation: cephalic Fetal monitoringBaseline: 125 bpm, Variability: Good {> 6 bpm), Accelerations:  Reactive, and Decelerations: Absent Uterine activityFrequency: 2-3 times per hour Dilation: 2 Effacement (%): Thick Station: Ballotable Exam by:: Mellody Dance, RN  Prenatal labs: ABO, Rh: --/--/O POS (01/06 0103) Antibody: NEG (01/06 0103) Rubella: 6.85 (06/27  1547) RPR: Non Reactive (10/19 0911)  HBsAg: Negative (06/27 1547)  HIV: Non Reactive (10/19 0911)  GBS: Negative/-- (12/19 1520)  1 hr Glucola negative Genetic screening normal Anatomy US normal  Prenatal Transfer Tool  Maternal Diabetes: No Genetic Screening: Normal Maternal Ultrasounds/Referrals: Normal Fetal Ultrasounds or other Referrals:  None Maternal Substance Abuse:  No Significant Maternal Medications:  None Significant Maternal Lab Results:  Group B Strep negative Number of Prenatal Visits:greater than 3 verified prenatal visits Other Comments:  None  Results for orders placed or performed during the hospital encounter of 08/27/22 (from the past 24 hour(s))  Type and screen   Collection Time: 08/27/22  1:03 AM  Result Value Ref Range   ABO/RH(D) O POS    Antibody Screen NEG    Sample Expiration      08/30/2022,2359 Performed at [facility], [place] [address] [place], [place] [address]   CBC   Collection Time: 08/27/22  1:05 AM  Result Value Ref Range   WBC 11.8 (H) 4.0 - 10.5 K/uL   RBC 4.32 3.87 - 5.11 MIL/uL   Hemoglobin 10.3 (L) 12.0 - 15.0 g/dL   HCT 33.2 (L) 36.0 - 46.0 %   MCV 76.9 (L) 80.0 - 100.0 fL   MCH 23.8 (L) 26.0 - 34.0 pg   MCHC 31.0 30.0 - 36.0 g/dL   RDW 15.5 11.5 - 15.5 %   Platelets 269 150 - 400 K/uL   nRBC 0.2 0.0 - 0.2 %    Patient Active Problem List   Diagnosis Date Noted   Labor and delivery indication for care or intervention 08/27/2022   Anemia in pregnancy, third trimester 06/10/2022   Supervision of normal pregnancy, antepartum 02/15/2022   Obesity in pregnancy, antepartum 02/15/2022    Assessment/Plan:  Gail Perez is a 32 y.o. G5P3013 at [redacted]w[redacted]d here for  elective induction  #Labor: Cytotec 50/25 mcg administered #Pain: IV pain meds, family support, epidural upon request #FWB: Category 1 #ID:  GBS negative #MOF: Breast and bottle #MOC: IUD outpatient # Anemia: Hemoglobin 10.3 on admission  Shelda Pal, DO  08/27/2022, 3:11 AM

## 2022-08-27 NOTE — Discharge Summary (Signed)
Postpartum Discharge Summary  Date of Service updated 08/28/2022     Patient Name: Gail Perez DOB: May 04, 1991 MRN: [id number]  Date of admission: 08/27/2022 Delivery date:08/27/2022  Delivering provider: Julianne Handler  Date of discharge:  08/28/2022   Admitting diagnosis: Labor and delivery indication for care or intervention [O75.9] Intrauterine pregnancy: [redacted]w[redacted]d     Secondary diagnosis:  Principal Problem:   Labor and delivery indication for care or intervention Active Problems:   SVD (spontaneous vaginal delivery)  Additional problems: none    Discharge diagnosis: Term Pregnancy Delivered                                              Post partum procedures: none Augmentation: AROM, Pitocin, and Cytotec Complications: None  Hospital course: Induction of Labor With Vaginal Delivery   32 y.o. yo [id number] at [redacted]w[redacted]d was admitted to the hospital 08/27/2022 for induction of labor.  Indication for induction: Elective.  Patient had an labor course  uncomplicated. Membrane Rupture Time/Date: 11:20 AM ,08/27/2022   Delivery Method:Vaginal, Spontaneous  Episiotomy: None  Lacerations:  None  Details of delivery can be found in separate delivery note.  Patient had a postpartum course that was uncomplicated . Patient is discharged home 08/28/2022   Newborn Data: Birth date:08/27/2022  Birth time:1:29 PM  Gender:Female  Living status:Living  Apgars:8 ,9  Weight:3260 g   Magnesium Sulfate received: No BMZ received: No Rhophylac:No MMR:No T-DaP:Given prenatally Flu: No Transfusion:No  Physical exam  Vitals:   08/27/22 1646 08/27/22 2046 08/28/22 0100 08/28/22 0525  BP: 135/79 133/78 121/65 127/71  Pulse: 85 79 76 77  Resp: 18 16 16 18   Temp: 98.6 F (37 C) 97.6 F (36.4 C) 97.7 F (36.5 C) 97.7 F (36.5 C)  TempSrc: Oral Oral Oral Oral  SpO2: 100% 100% 100% 100%  Weight:      Height:       General: alert, cooperative, and no distress Lochia: appropriate Uterine Fundus:  firm Incision: N/A DVT Evaluation: No evidence of DVT seen on physical exam. Labs: Lab Results  Component Value Date   WBC 11.8 (H) 08/27/2022   HGB 10.3 (L) 08/27/2022   HCT 33.2 (L) 08/27/2022   MCV 76.9 (L) 08/27/2022   PLT 269 08/27/2022      Latest Ref Rng & Units 02/15/2022    3:47 PM  CMP  Glucose 70 - 99 mg/dL 87   BUN 6 - 20 mg/dL 9   Creatinine 0.57 - 1.00 mg/dL 0.76   Sodium 134 - 144 mmol/L 136   Potassium 3.5 - 5.2 mmol/L 4.4   Chloride 96 - 106 mmol/L 99   CO2 20 - 29 mmol/L 22   Calcium 8.7 - 10.2 mg/dL 9.6   Total Protein 6.0 - 8.5 g/dL 7.1   Total Bilirubin 0.0 - 1.2 mg/dL 0.2   Alkaline Phos 44 - 121 IU/L 53   AST 0 - 40 IU/L 14   ALT 0 - 32 IU/L 13    Edinburgh Score:    08/27/2022    3:48 PM  Edinburgh Postnatal Depression Scale Screening Tool  I have been able to laugh and see the funny side of things. 0  I have looked forward with enjoyment to things. 0  I have blamed myself unnecessarily when things went wrong. 0  I have been anxious or worried for  no good reason. 0  I have felt scared or panicky for no good reason. 0  Things have been getting on top of me. 1  I have been so unhappy that I have had difficulty sleeping. 0  I have felt sad or miserable. 0  I have been so unhappy that I have been crying. 0  The thought of harming myself has occurred to me. 0  Edinburgh Postnatal Depression Scale Total 1     After visit meds:  Allergies as of 08/28/2022   No Known Allergies      Medication List     TAKE these medications    diphenhydramine-acetaminophen 25-500 MG Tabs tablet Commonly known as: TYLENOL PM Take 1 tablet by mouth at bedtime as needed.   docusate sodium 100 MG capsule Commonly known as: COLACE Take 1 capsule (100 mg total) by mouth 2 (two) times daily as needed.   ferrous sulfate 325 (65 FE) MG tablet Commonly known as: FerrouSul Take 1 tablet (325 mg total) by mouth daily with breakfast. Take 1 by mouth every other  day   fluconazole 150 MG tablet Commonly known as: DIFLUCAN Take 1 tablet (150 mg total) by mouth every 3 (three) days. For three doses   ibuprofen 600 MG tablet Commonly known as: ADVIL Take 1 tablet (600 mg total) by mouth every 6 (six) hours.   polyethylene glycol 17 g packet Commonly known as: MiraLax Take 17 g by mouth daily.   prenatal multivitamin Tabs tablet Take 1 tablet by mouth daily at 12 noon.   witch hazel-glycerin pad Commonly known as: TUCKS Apply 1 Application topically as needed for itching.         Discharge home in stable condition Infant Feeding: Bottle and Breast Infant Disposition:home with mother Discharge instruction: per After Visit Summary and Postpartum booklet. Activity: Advance as tolerated. Pelvic rest for 6 weeks.  Diet: routine diet Future Appointments:No future appointments. Follow up Visit:   Please schedule this patient for a In person postpartum visit in 6 weeks with the following provider: Any provider. Additional Postpartum F/U: n/a   Low risk pregnancy complicated by:  n/a Delivery mode:  Vaginal, Spontaneous  Anticipated Birth Control:  IUD   08/28/2022 Scheryl Darter, MD

## 2022-08-27 NOTE — Progress Notes (Signed)
Labor Progress Note Gail Perez is a 32 y.o. G5P3013 at [redacted]w[redacted]d presented for eIOL  S:  Feeling more painful and regular ctx  O:  BP 135/69   Pulse 78   Temp (!) 97.4 F (36.3 C) (Oral)   Resp 20   Ht 5\' 9"  (1.753 m)   Wt 112.5 kg   LMP 11/20/2021 Comment: G4 P3  BMI 36.64 kg/m  EFM: baseline 125 bpm/ mod variability/ no accels/ no decels  Toco/IUPC: q3 SVE: Dilation: 5 Effacement (%): 80 Station: -1 Presentation: Vertex Exam by:: Julianne Handler Pitocin: 6 mu/min  A/P: 32 y.o. [id number] [redacted]w[redacted]d  1. Labor: early active, progressing well 2. FWB: Cat I 3. Pain: analgesia/anesthesia/NO prn   Consented for AROM, AROM's for small amt clear fluid. Anticipate SVD.  Julianne Handler, CNM 11:34 AM

## 2022-08-27 NOTE — Progress Notes (Signed)
Labor Progress Note Gail Perez is a 32 y.o. [id number] at [redacted]w[redacted]d presented for eIOL  S:  Feeling some ctx.   O:  BP 131/79   Pulse 72   Temp (!) 97.4 F (36.3 C) (Oral)   Resp 20   Ht 5\' 9"  (1.753 m)   Wt 112.5 kg   LMP 11/20/2021 Comment: G4 P3  BMI 36.64 kg/m  EFM: baseline 125 bpm/ mod variability/ + accels/ no decels  Toco/IUPC: 2.5-6 SVE: Dilation: 3 Effacement (%): 50 Station: -2, -1 Presentation: Vertex (Confirmed by ultrasound) Exam by:: Myrtie Soman, RN Pitocin: 6 mu/min  A/P: 32 y.o. [id number] [redacted]w[redacted]d  1. Labor: latent 2. FWB: Cat I 3. Pain: analgesia/anesthesia/NO prn   Continue Pitocin. Consider AROM at next check. Anticipate SVD.  Julianne Handler, CNM 9:52 AM

## 2022-08-28 ENCOUNTER — Encounter: Payer: Self-pay | Admitting: Obstetrics & Gynecology

## 2022-08-28 MED ORDER — IBUPROFEN 600 MG PO TABS: 600.0000 mg | ORAL_TABLET | Freq: Four times a day (QID) | ORAL | 0 refills | Status: DC

## 2022-08-31 DIAGNOSIS — N61 Mastitis without abscess: Secondary | ICD-10-CM | POA: Diagnosis not present

## 2022-08-31 DIAGNOSIS — R030 Elevated blood-pressure reading, without diagnosis of hypertension: Secondary | ICD-10-CM | POA: Diagnosis not present

## 2022-08-31 DIAGNOSIS — J029 Acute pharyngitis, unspecified: Secondary | ICD-10-CM | POA: Diagnosis not present

## 2022-09-05 ENCOUNTER — Telehealth (HOSPITAL_COMMUNITY): Payer: Self-pay | Admitting: *Deleted

## 2022-09-05 NOTE — Telephone Encounter (Signed)
Left phone voicemail message.  Odis Hollingshead, RN 09-05-2022 at 10:39am

## 2022-09-13 ENCOUNTER — Telehealth: Payer: Self-pay | Admitting: *Deleted

## 2022-09-13 MED ORDER — NIFEDIPINE ER OSMOTIC RELEASE 30 MG PO TB24: 30.0000 mg | ORAL_TABLET | Freq: Every day | ORAL | 2 refills | Status: DC

## 2022-09-13 NOTE — Telephone Encounter (Signed)
Received voicemail  from [employer] from [employer]. Pt today had 2 elevated BP 142/88 and 144/90. Had a slight HA but has resolved.   Disucssed with Dr A, will start pt on procardia 30 daily and will have pt come into the office next week for BP check.  Left message with Camilla with [facility].   Pt called and made aware and appt made.

## 2022-09-20 ENCOUNTER — Ambulatory Visit (INDEPENDENT_AMBULATORY_CARE_PROVIDER_SITE_OTHER): Payer: BC Managed Care – PPO | Admitting: *Deleted

## 2022-09-20 VITALS — BP 137/85 | HR 77 | Wt 218.0 lb

## 2022-09-20 DIAGNOSIS — Z0130 Encounter for examination of blood pressure without abnormal findings: Secondary | ICD-10-CM

## 2022-09-20 NOTE — Progress Notes (Signed)
Subjective:  Gail Perez is a 32 y.o. female here for BP check.   Hypertension ROS: Patient denies any headaches, visual symptoms, RUQ/epigastric pain or other concerning symptoms.  Objective:  BP 137/85   Pulse 77   Wt 218 lb (98.9 kg)   Breastfeeding Yes   BMI 32.19 kg/m   Appearance alert, well appearing, and in no distress. General exam BP noted to be stable today in office.    Assessment:   Blood Pressure stable.   Plan:  Current treatment plan is effective, no change in therapy. Patient wants an IUD for birth control and agrees to abstain from IC until after IUD insertion during routine pp visit.

## 2022-09-29 ENCOUNTER — Ambulatory Visit (INDEPENDENT_AMBULATORY_CARE_PROVIDER_SITE_OTHER): Payer: BC Managed Care – PPO | Admitting: Advanced Practice Midwife

## 2022-09-29 ENCOUNTER — Other Ambulatory Visit (HOSPITAL_COMMUNITY)
Admission: RE | Admit: 2022-09-29 | Discharge: 2022-09-29 | Disposition: A | Discharge status: Home / Self Care | Payer: BC Managed Care – PPO | Source: Ambulatory Visit | Attending: Obstetrics and Gynecology | Admitting: Obstetrics and Gynecology

## 2022-09-29 DIAGNOSIS — K5900 Constipation, unspecified: Secondary | ICD-10-CM | POA: Diagnosis not present

## 2022-09-29 DIAGNOSIS — Z30430 Encounter for insertion of intrauterine contraceptive device: Secondary | ICD-10-CM

## 2022-09-29 DIAGNOSIS — N888 Other specified noninflammatory disorders of cervix uteri: Secondary | ICD-10-CM | POA: Diagnosis not present

## 2022-09-29 DIAGNOSIS — N898 Other specified noninflammatory disorders of vagina: Secondary | ICD-10-CM | POA: Diagnosis not present

## 2022-09-29 DIAGNOSIS — N72 Inflammatory disease of cervix uteri: Secondary | ICD-10-CM | POA: Diagnosis not present

## 2022-09-29 LAB — POCT URINE PREGNANCY: Preg Test, Ur: NEGATIVE

## 2022-09-29 MED ORDER — LEVONORGESTREL 20 MCG/DAY IU IUD
1.0000 | INTRAUTERINE_SYSTEM | Freq: Once | INTRAUTERINE | Status: AC
  Administered 2022-09-29: 1 via INTRAUTERINE

## 2022-09-29 NOTE — Progress Notes (Signed)
    [place] Partum Visit Note  Gail Perez is a 32 y.o. [phone] female who presents for a postpartum visit. She is 4 weeks postpartum following a normal spontaneous vaginal delivery.  I have fully reviewed the prenatal and intrapartum course. The delivery was at 40 gestational weeks.  Anesthesia: none. Postpartum course has been uncomplicated. Baby is doing well. Baby is feeding by breast. Bleeding  Yes Heavy . Patient states she stopped bleeding around week 3 postpartum, then her bleeding start 3-4 days ago. .Bowel function is  Constipated  . Bladder function is normal. Patient is not sexually active. Contraception method is none. Postpartum depression screening: negative.   The pregnancy intention screening data noted above was reviewed. Potential methods of contraception were discussed. The patient elected to proceed with Mirena IUD    Health Maintenance Due  Topic Date Due   COVID-19 Vaccine (1) Never done   INFLUENZA VACCINE  Never done    The following portions of the patient's history were reviewed and updated as appropriate: allergies, current medications, past family history, past medical history, past social history, past surgical history, and problem list.  Review of Systems A comprehensive review of systems was negative.  Objective:  There were no vitals taken for this visit.   General:  alert, cooperative, appears stated age, and no distress   Breasts:  not indicated  Lungs: Normal WOB  Heart:  Normal pulse  Abdomen:  Soft, non-tender   GU exam:   Small membranous tissue protruding from cervical os. Will send to pathology.   1cm non-tender growth on right vaginal wall near introitus (see pic)           Assessment:   Postpartum Care and examination Vaginal bleeding, likely return of menstrual cycle IUD counseling and placement (see procedure note) 4.   Normal postpartum exam.   Plan:   Essential components of care per ACOG recommendations:  1.  Mood and well  being: Patient with negative depression screening today. Reviewed local resources for support.  - Patient tobacco use? No.   - hx of drug use? No.    2. Infant care and feeding:  -Patient currently breastmilk feeding? Yes. Reviewed importance of draining breast regularly to support lactation.  -Social determinants of health (SDOH) reviewed in EPIC. No concerns  3. Sexuality, contraception and birth spacing - Patient desired IUD or IUS today.    4. Sleep and fatigue -Encouraged family/partner/community support of 4 hrs of uninterrupted sleep to help with mood and fatigue  5. Physical Recovery  - Discussed patients delivery and complications. She describes her labor as good. - Patient had a Vaginal, no problems at delivery. Patient had a hemostatic abrasion near her introitus, no repair indicated.  Perineal healing reviewed. Patient expressed understanding - Patient has urinary incontinence? No. - Patient is safe to resume physical and sexual activity  6.  Health Maintenance - HM due items addressed: no - Last pap smear  Diagnosis  Date Value Ref Range Status  02/15/2022   Final   - Negative for intraepithelial lesion or malignancy (NILM)   Pap smear not done at today's visit.  -Breast Cancer screening indicated? No.   7. Chronic Disease/Pregnancy Condition follow up: None  - PCP follow up  Mallie Snooks, [place], MSN, CNM Certified Nurse Midwife, [facility] for [employer], [place]

## 2022-09-29 NOTE — Progress Notes (Signed)
    GYNECOLOGY OFFICE PROCEDURE NOTE  Gail Perez is a 32 y.o. [id number] here for IUD insertion. No GYN concerns.   IUD Insertion Procedure Note Patient identified, informed consent performed, consent signed.   Discussed risks of irregular bleeding, cramping, infection, malpositioning or misplacement of the IUD outside the uterus which may require further procedure such as laparoscopy. Also discussed >99% contraception efficacy, increased risk of ectopic pregnancy with failure of method.   Emphasized that this did not protect against STIs, condoms recommended during all sexual encounters. Time out was performed.  Urine pregnancy test negative.  Speculum placed in the vagina.  Cervix visualized.  Cleaned with Betadine x 2.  Grasped anteriorly with a single tooth tenaculum.  Uterus sounded to 7.5 cm.  Mirena IUD placed per manufacturer's recommendations.  Strings trimmed to 3 cm. Tenaculum was removed, good hemostasis noted.  Patient tolerated procedure well.   Patient was given post-procedure instructions.  She was advised to have backup contraception for one week.  Patient was also asked to check IUD strings periodically and follow up in 4 weeks for IUD check.   Mallie Snooks, [place], MSN, CNM Certified Nurse Midwife, Product/process development scientist for [employer], [place]

## 2022-09-30 LAB — SURGICAL PATHOLOGY

## 2022-10-04 ENCOUNTER — Ambulatory Visit: Payer: BC Managed Care – PPO | Admitting: Obstetrics and Gynecology

## 2022-10-27 ENCOUNTER — Ambulatory Visit: Payer: BC Managed Care – PPO | Admitting: Advanced Practice Midwife

## 2022-10-27 NOTE — Progress Notes (Signed)
   GYNECOLOGY OFFICE VISIT NOTE  History:   Gail Perez is a 32 y.o. [id number] here today for IUD check up for strings.  Having BTB on IUD. Had this before and she kept it in 3 years but has BTB on it the whole time. She is hoping it doesn't happen again.      Past Medical History:  Diagnosis Date   Allergy    Chlamydia    Hx of varicella    Pregnancy induced hypertension 08/22/2012   Seasonal allergies    Urinary tract infection    Vaginal Pap smear, abnormal     Past Surgical History:  Procedure Laterality Date   LEEP      The following portions of the patient's history were reviewed and updated as appropriate: allergies, current medications, past family history, past medical history, past social history, past surgical history and problem list.   Health Maintenance:    Diagnosis  Date Value Ref Range Status  02/15/2022   Final   - Negative for intraepithelial lesion or malignancy (NILM)    Review of Systems:  Pertinent items noted in HPI and remainder of comprehensive ROS otherwise negative.  Physical Exam:  BP (!) 146/98   Pulse 64   Ht 5\' 9"  (1.753 m)   Wt 209 lb (94.8 kg)   Breastfeeding Yes   BMI 30.86 kg/m  CONSTITUTIONAL: Well-developed, well-nourished female in no acute distress.  HEENT:  Normocephalic, atraumatic. External right and left ear normal. No scleral icterus.  NECK: Normal range of motion, supple, no masses noted on observation SKIN: No rash noted. Not diaphoretic. No erythema. No pallor. MUSCULOSKELETAL: Normal range of motion. No edema noted. NEUROLOGIC: Alert and oriented to person, place, and time. Normal muscle tone coordination. No cranial nerve deficit noted. PSYCHIATRIC: Normal mood and affect. Normal behavior. Normal judgment and thought content.  PELVIC:  IUD strings visualized  Labs and Imaging No results found for this or any previous visit (from the past 168 hour(s)). No results found.  Assessment and Plan:   1. IUD check up -  IUD in place - She will try lysteda after a month if still BTB. Discussed if this doesn't help, would try POP (Slynd or Norethindrone, depending on insurance - slynd would be preferred). If that didn't work, would do estradiol tablets for 3-4 weeks at 1mg  daily.   2. Elevated BP  - Recheck still elevated - Would resume Procardia and f/u with PCP ASAP.   Routine preventative health maintenance measures emphasized. Please refer to After Visit Summary for other counseling recommendations.   Return in about 6 months (around 04/30/2023) for annual.  Radene Gunning, MD, [place], [facility] for [facility], [place]

## 2022-10-28 ENCOUNTER — Other Ambulatory Visit: Payer: Self-pay | Admitting: Obstetrics & Gynecology

## 2022-10-28 ENCOUNTER — Encounter: Payer: Self-pay | Admitting: Obstetrics and Gynecology

## 2022-10-28 ENCOUNTER — Ambulatory Visit (INDEPENDENT_AMBULATORY_CARE_PROVIDER_SITE_OTHER): Payer: BC Managed Care – PPO | Admitting: Obstetrics and Gynecology

## 2022-10-28 VITALS — BP 146/98 | HR 64 | Ht 69.0 in | Wt 209.0 lb

## 2022-10-28 DIAGNOSIS — Z30431 Encounter for routine checking of intrauterine contraceptive device: Secondary | ICD-10-CM

## 2022-10-28 DIAGNOSIS — R030 Elevated blood-pressure reading, without diagnosis of hypertension: Secondary | ICD-10-CM | POA: Diagnosis not present

## 2022-10-28 MED ORDER — NIFEDIPINE ER OSMOTIC RELEASE 30 MG PO TB24: 30.0000 mg | ORAL_TABLET | Freq: Every day | ORAL | 1 refills | Status: DC

## 2022-10-28 MED ORDER — TRANEXAMIC ACID 650 MG PO TABS: 1300.0000 mg | ORAL_TABLET | Freq: Three times a day (TID) | ORAL | 2 refills | Status: DC

## 2022-10-28 NOTE — Patient Instructions (Signed)
Don't forget to make appointment with PCP to follow up on Blood pressure.

## 2022-10-28 NOTE — Progress Notes (Signed)
RGYN pt here for IUD check.  CC: irregular bleeding. HA this morning, and visual changes going to eye doctor this weekend notes blurriness.

## 2022-11-16 ENCOUNTER — Encounter: Payer: Self-pay | Admitting: Obstetrics and Gynecology

## 2023-05-11 ENCOUNTER — Ambulatory Visit (INDEPENDENT_AMBULATORY_CARE_PROVIDER_SITE_OTHER): Payer: BC Managed Care – PPO | Admitting: Internal Medicine

## 2023-05-11 VITALS — BP 134/86 | HR 84 | Temp 96.6°F | Wt 207.0 lb

## 2023-05-11 DIAGNOSIS — K5900 Constipation, unspecified: Secondary | ICD-10-CM | POA: Diagnosis not present

## 2023-05-11 DIAGNOSIS — R7309 Other abnormal glucose: Secondary | ICD-10-CM | POA: Diagnosis not present

## 2023-05-11 DIAGNOSIS — R1084 Generalized abdominal pain: Secondary | ICD-10-CM | POA: Diagnosis not present

## 2023-05-11 DIAGNOSIS — Z136 Encounter for screening for cardiovascular disorders: Secondary | ICD-10-CM

## 2023-05-11 DIAGNOSIS — R140 Abdominal distension (gaseous): Secondary | ICD-10-CM | POA: Diagnosis not present

## 2023-05-11 LAB — POCT URINALYSIS DIPSTICK
Bilirubin, UA: NEGATIVE
Blood, UA: NEGATIVE
Glucose, UA: NEGATIVE
Ketones, UA: NEGATIVE
Leukocytes, UA: NEGATIVE
Nitrite, UA: NEGATIVE
Protein, UA: NEGATIVE
Spec Grav, UA: 1.01 (ref 1.010–1.025)
Urobilinogen, UA: 0.2 E.U./dL
pH, UA: 7 (ref 5.0–8.0)

## 2023-05-11 NOTE — Progress Notes (Signed)
Subjective:    Patient ID: Gail Perez, female    DOB: 1991-07-21, 32 y.o.   MRN: [id number]  HPI  Patient presents to the clinic today with complaint of generalized abdominal pain.  This started about 1 month ago.  She reports the pain is intermittent but it seems to be happening more frequently. She describes the pain as sharp, stabbing and cramping. She reports it does not always occur in relation to eating, but it can be worse after eating. She has had some bloating but denies nausea or vomiting. She reports occasional reflux but this feels different than that. She reports constipation, but denies diarrhea or blood in her stool. She has a BM daily but never fully feels like she is emptying her bowels. She denies urinary or vaginal symptoms. She has an IUD so she does not have regular periods. She as tried ibuprofen and metamucil OTC with minimal relief of symptoms.  Review of Systems     Past Medical History:  Diagnosis Date   Allergy    Chlamydia    Hx of varicella    Pregnancy induced hypertension 08/22/2012   Seasonal allergies    Urinary tract infection    Vaginal Pap smear, abnormal     Current Outpatient Medications  Medication Sig Dispense Refill   diphenhydramine-acetaminophen (TYLENOL PM) 25-500 MG TABS tablet Take 1 tablet by mouth at bedtime as needed. 30 tablet 0   NIFEdipine (PROCARDIA-XL/NIFEDICAL-XL) 30 MG 24 hr tablet TAKE 1 TABLET BY MOUTH EVERY DAY 90 tablet 1   tranexamic acid (LYSTEDA) 650 MG TABS tablet Take 2 tablets (1,300 mg total) by mouth 3 (three) times daily. Take during menses for a maximum of five days 30 tablet 2   No current facility-administered medications for this visit.    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    Comment: [employer]  Tobacco Use   Smoking status: Never    Passive exposure: Never   Smokeless tobacco: Never  Vaping Use   Vaping status: Never Used  Substance and Sexual Activity   Alcohol use: Not Currently    Comment: occasional   Drug use: No   Sexual activity: Not Currently    Birth control/protection: None  Other Topics Concern   Not on file  Social History Narrative   Not on file   Social Determinants of Health   Financial Resource Strain: Low Risk  (04/30/2018)   Overall Financial Resource Strain (CARDIA)    Difficulty of Paying Living Expenses: Not hard at all  Food Insecurity: No Food Insecurity (08/27/2022)   Hunger Vital Sign    Worried About Running Out of Food in the Last Year: Never true    Ran Out of Food in the Last Year: Never true  Transportation Needs: No Transportation Needs (08/27/2022)   PRAPARE - Administrator, Civil Service (Medical): No    Lack of Transportation (Non-Medical): No  Physical Activity: Inactive (04/30/2018)   Exercise Vital Sign    Days of Exercise per Week: 0 days    Minutes of Exercise per Session: 0 min  Stress: Not on file  Social Connections: Not on file  Intimate Partner Violence: Not At Risk (08/27/2022)   Humiliation, Afraid, Rape, and Kick questionnaire    Fear of Current or Ex-Partner: No    Emotionally Abused: No    Physically Abused: No    Sexually Abused: No     Constitutional: Denies fever, malaise, fatigue, headache or abrupt weight changes.  Respiratory: Denies difficulty breathing, shortness of breath, cough or sputum production.   Cardiovascular: Denies chest pain, chest tightness,  palpitations or swelling in the hands or feet.  Gastrointestinal: Patient reports generalized abdominal pain, bloating and constipation.  Denies diarrhea or blood in the stool.  GU: Denies urgency, frequency, pain with urination, burning sensation, blood in urine, odor or discharge.  No other specific complaints in a complete review of systems (except as listed in HPI above).  Objective:   Physical Exam  BP 134/86 (BP Location: Left Arm, Patient Position: Sitting, Cuff Size: Normal)   Pulse 84   Temp (!) 96.6 F (35.9 C) (Temporal)   Wt 207 lb (93.9 kg)   SpO2 99%   Breastfeeding No   BMI 30.57 kg/m   Wt Readings from Last 3 Encounters:  10/28/22 209 lb (94.8 kg)  09/29/22 213 lb (96.6 kg)  09/20/22 218 lb (98.9 kg)    General: Appears her stated age, obese, in NAD. Cardiovascular: Normal rate and rhythm. S1,S2 noted.  No murmur, rubs or gallops noted.  Pulmonary/Chest: Normal effort and positive vesicular breath sounds. No respiratory distress. No wheezes, rales or ronchi noted.  Abdomen: Soft and nontender. Normal bowel sounds. No distention or masses noted. Liver, spleen and kidneys non palpable. Musculoskeletal: No difficulty with gait.  Neurological: Alert and oriented.   BMET    Component Value Date/Time   NA 136 02/15/2022 1547   K 4.4 02/15/2022 1547   CL 99 02/15/2022 1547   CO2 22 02/15/2022 1547   GLUCOSE 87 02/15/2022 1547   GLUCOSE 100 (H) 08/02/2021 0847   BUN 9 02/15/2022 1547   CREATININE 0.76 02/15/2022 1547   CREATININE 0.70 01/19/2016 0001   CALCIUM 9.6 02/15/2022 1547   GFRNONAA >60 08/02/2021 0847   GFRAA >60 12/05/2017 1333    Lipid Panel  No results found for: "CHOL", "TRIG", "HDL", "CHOLHDL", "VLDL", "LDLCALC"  CBC    Component Value Date/Time   WBC 11.8 (H) 08/27/2022 0105   RBC 4.32 08/27/2022 0105   HGB 10.3 (L) 08/27/2022 0105   HGB 11.4 06/09/2022 0911   HCT 33.2 (L) 08/27/2022 0105   HCT 34.5 06/09/2022 0911   PLT 269  08/27/2022 0105   PLT 280 06/09/2022 0911   MCV 76.9 (L) 08/27/2022 0105   MCV 81 06/09/2022 0911   MCH 23.8 (L) 08/27/2022 0105   MCHC 31.0 08/27/2022 0105   RDW 15.5 08/27/2022 0105   RDW 13.1 06/09/2022 0911   LYMPHSABS 1.6 02/15/2022 1547   MONOABS 0.5 08/02/2021 0847   EOSABS 0.1 02/15/2022 1547   BASOSABS 0.1 02/15/2022 1547    Hgb A1C Lab Results  Component Value Date   HGBA1C 5.2 02/15/2022  Assessment & Plan:   Generalized abdominal pain, bloating, constipation:  Encouraged high fiber diet and adequate water intake Start Align OTC daily Will check CBC, CMET and h pylori breat test Consider daily PPI vs CT abd/pelvis vs referral to GI if symptoms persist or worsen  Schedule an appointment for your annual exam Nicki Reaper, NP

## 2023-05-11 NOTE — Patient Instructions (Signed)
Abdominal Pain, Adult  Pain in the abdomen (abdominal pain) can be caused by many things. In most cases, it gets better with no treatment or by being treated at home. But in some cases, it can be serious. Your health care provider will ask questions about your medical history and do a physical exam to try to figure out what is causing your pain. Follow these instructions at home: Medicines Take over-the-counter and prescription medicines only as told by your provider. Do not take medicines that help you poop (laxatives) unless told by your provider. General instructions Watch your condition for any changes. Drink enough fluid to keep your pee (urine) pale yellow. Contact a health care provider if: Your pain changes, gets worse, or lasts longer than expected. You have severe cramping or bloating in your abdomen, or you vomit. Your pain gets worse with meals, after eating, or with certain foods. You are constipated or have diarrhea for more than 2-3 days. You are not hungry, or you lose weight without trying. You have signs of dehydration. These may include: Dark pee, very little pee, or no pee. Cracked lips or dry mouth. Sleepiness or weakness. You have pain when you pee (urinate) or poop. Your abdominal pain wakes you up at night. You have blood in your pee. You have a fever. Get help right away if: You cannot stop vomiting. Your pain is only in one part of the abdomen. Pain on the right side could be caused by appendicitis. You have bloody or black poop (stool), or poop that looks like tar. You have trouble breathing. You have chest pain. These symptoms may be an emergency. Get help right away. Call 911. Do not wait to see if the symptoms will go away. Do not drive yourself to the hospital. This information is not intended to replace advice given to you by your health care provider. Make sure you discuss any questions you have with your health care provider. Document Revised:  05/25/2022 Document Reviewed: 05/25/2022 Elsevier Patient Education  2024 [employer].

## 2023-05-11 NOTE — Addendum Note (Signed)
Addended by: Kavin Leech E on: 05/11/2023 04:16 PM   Modules accepted: Orders

## 2023-05-16 LAB — CBC
HCT: 43.1 % (ref 35.0–45.0)
Hemoglobin: 13.9 g/dL (ref 11.7–15.5)
MCH: 27.1 pg (ref 27.0–33.0)
MCHC: 32.3 g/dL (ref 32.0–36.0)
MCV: 84 fL (ref 80.0–100.0)
MPV: 10.1 fL (ref 7.5–12.5)
Platelets: 424 10*3/uL — ABNORMAL HIGH (ref 140–400)
RBC: 5.13 10*6/uL — ABNORMAL HIGH (ref 3.80–5.10)
RDW: 13.3 % (ref 11.0–15.0)
WBC: 7.2 10*3/uL (ref 3.8–10.8)

## 2023-05-16 LAB — H. PYLORI BREATH TEST: H. pylori Breath Test: NOT DETECTED

## 2023-05-16 LAB — LIPID PANEL
Cholesterol: 142 mg/dL (ref <200)
HDL: 52 mg/dL (ref >=50)
LDL Cholesterol (Calc): 70 mg/dL (calc)
Non-HDL Cholesterol (Calc): 90 mg/dL (calc) (ref <130)
Total CHOL/HDL Ratio: 2.7 (calc) (ref <5.0)
Triglycerides: 121 mg/dL (ref <150)

## 2023-05-16 LAB — COMPLETE METABOLIC PANEL WITH GFR
AG Ratio: 1.3 (calc) (ref 1.0–2.5)
ALT: 13 U/L (ref 6–29)
AST: 15 U/L (ref 10–30)
Albumin: 4.4 g/dL (ref 3.6–5.1)
Alkaline phosphatase (APISO): 76 U/L (ref 31–125)
BUN: 10 mg/dL (ref 7–25)
CO2: 28 mmol/L (ref 20–32)
Calcium: 9.8 mg/dL (ref 8.6–10.2)
Chloride: 104 mmol/L (ref 98–110)
Creat: 0.89 mg/dL (ref 0.50–0.97)
Globulin: 3.3 g/dL (calc) (ref 1.9–3.7)
Glucose, Bld: 96 mg/dL (ref 65–139)
Potassium: 4.6 mmol/L (ref 3.5–5.3)
Sodium: 139 mmol/L (ref 135–146)
Total Bilirubin: 0.6 mg/dL (ref 0.2–1.2)
Total Protein: 7.7 g/dL (ref 6.1–8.1)
eGFR: 87 mL/min/{1.73_m2} (ref >=60)

## 2023-05-16 LAB — HEMOGLOBIN A1C
Hgb A1c MFr Bld: 5.6 % of total Hgb (ref <5.7)
Mean Plasma Glucose: 114 mg/dL
eAG (mmol/L): 6.3 mmol/L

## 2023-05-29 ENCOUNTER — Encounter: Payer: Self-pay | Admitting: Internal Medicine

## 2023-05-29 ENCOUNTER — Ambulatory Visit (INDEPENDENT_AMBULATORY_CARE_PROVIDER_SITE_OTHER): Payer: BC Managed Care – PPO | Admitting: Internal Medicine

## 2023-05-29 VITALS — BP 142/80 | HR 70 | Temp 96.6°F | Ht 69.0 in | Wt 209.0 lb

## 2023-05-29 DIAGNOSIS — E6609 Other obesity due to excess calories: Secondary | ICD-10-CM | POA: Insufficient documentation

## 2023-05-29 DIAGNOSIS — Z0001 Encounter for general adult medical examination with abnormal findings: Secondary | ICD-10-CM | POA: Diagnosis not present

## 2023-05-29 DIAGNOSIS — I10 Essential (primary) hypertension: Secondary | ICD-10-CM | POA: Diagnosis not present

## 2023-05-29 DIAGNOSIS — E66811 Obesity, class 1: Secondary | ICD-10-CM | POA: Insufficient documentation

## 2023-05-29 DIAGNOSIS — Z6830 Body mass index (BMI) 30.0-30.9, adult: Secondary | ICD-10-CM

## 2023-05-29 DIAGNOSIS — E663 Overweight: Secondary | ICD-10-CM | POA: Insufficient documentation

## 2023-05-29 MED ORDER — AMLODIPINE BESYLATE 10 MG PO TABS
10.0000 mg | ORAL_TABLET | Freq: Every day | ORAL | 1 refills | Status: DC
Start: 2023-05-29 — End: 2024-02-01

## 2023-05-29 NOTE — Assessment & Plan Note (Signed)
Encouraged diet and exercise for weight loss ?

## 2023-05-29 NOTE — Progress Notes (Signed)
Subjective:    Patient ID: Gail Perez, female    DOB: 12/25/1990, 32 y.o.   MRN: [id number]  HPI  Patient presents to clinic today for her annual exam. Of note, her BP today is 148/86. She is not currently taking any antihypertensive medications.  Flu: Never Tetanus: 05/2022 COVID: x 2 Pap smear: 01/2022 Dentist: biannually  Diet: She does eat meat. She consumes some fruits and veggies. She does eat fried foods. She drinks sparkling water, dt soda, coffee. Exercise: Walking  Review of Systems  Past Medical History:  Diagnosis Date   Allergy    Chlamydia    Hx of varicella    Pregnancy induced hypertension 08/22/2012   Seasonal allergies    Urinary tract infection    Vaginal Pap smear, abnormal     No current outpatient medications on file.   No current facility-administered medications for this visit.    No Known Allergies  Family History  Problem Relation Age of Onset   Anemia Mother    Stroke Father    Hypertension Father    Hyperlipidemia Father    Heart disease Father        enlatged heart   Sarcoidosis Father    Hypertension Sister    Cancer Maternal Grandfather        lung   Cancer Paternal Grandmother    Other Neg Hx     Social History   Socioeconomic History   Marital status: Married    Spouse name: Orion   Number of children: Not on file   Years of education: Not on file   Highest education level: Master's degree (e.g., MA, MS, MEng, MEd, MSW, MBA)  Occupational History   Occupation: Product manager    Comment: [employer]  Tobacco Use   Smoking status: Never    Passive exposure: Never   Smokeless tobacco: Never  Vaping Use   Vaping status: Never Used  Substance and Sexual Activity   Alcohol use: Not Currently    Comment: occasional   Drug use: No   Sexual activity: Not Currently    Birth control/protection: None  Other Topics Concern   Not on file  Social History Narrative   Not on file   Social Determinants of Health   Financial  Resource Strain: Low Risk  (05/11/2023)   Overall Financial Resource Strain (CARDIA)    Difficulty of Paying Living Expenses: Not very hard  Food Insecurity: No Food Insecurity (05/11/2023)   Hunger Vital Sign    Worried About Running Out of Food in the Last Year: Never true    Ran Out of Food in the Last Year: Never true  Transportation Needs: No Transportation Needs (05/11/2023)   PRAPARE - Administrator, Civil Service (Medical): No    Lack of Transportation (Non-Medical): No  Physical Activity: Insufficiently Active (05/11/2023)   Exercise Vital Sign    Days of Exercise per Week: 3 days    Minutes of Exercise per Session: 30 min  Stress: Stress Concern Present (05/11/2023)   [employer] of Occupational Health - Occupational Stress Questionnaire    Feeling of Stress : To some extent  Social Connections: Socially Integrated (05/11/2023)   Social Connection and Isolation Panel [NHANES]    Frequency of Communication with Friends and Family: Three times a week    Frequency of Social Gatherings with Friends and Family: Twice a week    Attends Religious Services: More than 4 times per year    Active Member of  Clubs or Organizations: Yes    Attends Banker Meetings: More than 4 times per year    Marital Status: Married  Catering manager Violence: Not At Risk (08/27/2022)   Humiliation, Afraid, Rape, and Kick questionnaire    Fear of Current or Ex-Partner: No    Emotionally Abused: No    Physically Abused: No    Sexually Abused: No     Constitutional: Denies fever, malaise, fatigue, headache or abrupt weight changes.  HEENT: Denies eye pain, eye redness, ear pain, ringing in the ears, wax buildup, runny nose, nasal congestion, bloody nose, or sore throat. Respiratory: Denies difficulty breathing, shortness of breath, cough or sputum production.   Cardiovascular: Denies chest pain, chest tightness, palpitations or swelling in the hands or feet.   Gastrointestinal: Denies abdominal pain, bloating, constipation, diarrhea or blood in the stool.  GU: Denies urgency, frequency, pain with urination, burning sensation, blood in urine, odor or discharge. Musculoskeletal: Denies decrease in range of motion, difficulty with gait, muscle pain or joint pain and swelling.  Skin: Denies redness, rashes, lesions or ulcercations.  Neurological: Denies dizziness, difficulty with memory, difficulty with speech or problems with balance and coordination.  Psych: Denies anxiety, depression, SI/HI.  No other specific complaints in a complete review of systems (except as listed in HPI above).     Objective:   Physical Exam   BP (!) 142/80 (BP Location: Left Arm, Patient Position: Sitting, Cuff Size: Normal)   Pulse 70   Temp (!) 96.6 F (35.9 C) (Temporal)   Ht 5\' 9"  (1.753 m)   Wt 209 lb (94.8 kg)   SpO2 100%   BMI 30.86 kg/m   Wt Readings from Last 3 Encounters:  05/11/23 207 lb (93.9 kg)  10/28/22 209 lb (94.8 kg)  09/29/22 213 lb (96.6 kg)    General: Appears her stated age, obese in NAD. Skin: Warm, dry and intact.  HEENT: Head: normal shape and size; Eyes: sclera white, no icterus, conjunctiva pink, PERRLA and EOMs intact;  Neck:  Neck supple, trachea midline. No masses, lumps or thyromegaly present.  Cardiovascular: Normal rate and rhythm. S1,S2 noted.  No murmur, rubs or gallops noted. No JVD or BLE edema. Pulmonary/Chest: Normal effort and positive vesicular breath sounds. No respiratory distress. No wheezes, rales or ronchi noted.  Abdomen: Soft and nontender. Normal bowel sounds.  Musculoskeletal: Strength 5/5 BUE/BLE.  No difficulty with gait.  Neurological: Alert and oriented. Cranial nerves II-XII grossly intact. Coordination normal.  Psychiatric: Mood and affect normal. Behavior is normal. Judgment and thought content normal.    BMET    Component Value Date/Time   NA 139 05/11/2023 1526   NA 136 02/15/2022 1547   K  4.6 05/11/2023 1526   CL 104 05/11/2023 1526   CO2 28 05/11/2023 1526   GLUCOSE 96 05/11/2023 1526   BUN 10 05/11/2023 1526   BUN 9 02/15/2022 1547   CREATININE 0.89 05/11/2023 1526   CALCIUM 9.8 05/11/2023 1526   GFRNONAA >60 08/02/2021 0847   GFRAA >60 12/05/2017 1333    Lipid Panel     Component Value Date/Time   CHOL 142 05/11/2023 1526   TRIG 121 05/11/2023 1526   HDL 52 05/11/2023 1526   CHOLHDL 2.7 05/11/2023 1526   LDLCALC 70 05/11/2023 1526    CBC    Component Value Date/Time   WBC 7.2 05/11/2023 1526   RBC 5.13 (H) 05/11/2023 1526   HGB 13.9 05/11/2023 1526   HGB 11.4 06/09/2022 0911  HCT 43.1 05/11/2023 1526   HCT 34.5 06/09/2022 0911   PLT 424 (H) 05/11/2023 1526   PLT 280 06/09/2022 0911   MCV 84.0 05/11/2023 1526   MCV 81 06/09/2022 0911   MCH 27.1 05/11/2023 1526   MCHC 32.3 05/11/2023 1526   RDW 13.3 05/11/2023 1526   RDW 13.1 06/09/2022 0911   LYMPHSABS 1.6 02/15/2022 1547   MONOABS 0.5 08/02/2021 0847   EOSABS 0.1 02/15/2022 1547   BASOSABS 0.1 02/15/2022 1547    Hgb A1C Lab Results  Component Value Date   HGBA1C 5.6 05/11/2023           Assessment & Plan:   Preventative health maintenance:  Flu shot declined Tetanus UTD Encouraged her to get her COVID-vaccine Pap smear UTD Encouraged her to consume a balanced diet and exercise regimen Advised her to see an eye doctor and dentist annually Recent labs reviewed  RTC in 3 weeks for followup of HTN Nicki Reaper, NP

## 2023-05-29 NOTE — Assessment & Plan Note (Signed)
Will start amlodipine 10 mg daily Reinforced DASH diet and exercise for weight loss

## 2023-05-29 NOTE — Patient Instructions (Signed)

## 2023-06-19 ENCOUNTER — Encounter: Payer: Self-pay | Admitting: Internal Medicine

## 2023-06-19 ENCOUNTER — Ambulatory Visit (INDEPENDENT_AMBULATORY_CARE_PROVIDER_SITE_OTHER): Payer: BC Managed Care – PPO | Admitting: Internal Medicine

## 2023-06-19 VITALS — BP 136/86 | Ht 69.0 in | Wt 210.0 lb

## 2023-06-19 DIAGNOSIS — E6609 Other obesity due to excess calories: Secondary | ICD-10-CM

## 2023-06-19 DIAGNOSIS — I10 Essential (primary) hypertension: Secondary | ICD-10-CM | POA: Diagnosis not present

## 2023-06-19 DIAGNOSIS — Z6830 Body mass index (BMI) 30.0-30.9, adult: Secondary | ICD-10-CM | POA: Diagnosis not present

## 2023-06-19 DIAGNOSIS — E66811 Obesity, class 1: Secondary | ICD-10-CM | POA: Diagnosis not present

## 2023-06-19 NOTE — Assessment & Plan Note (Signed)
Improved on amlodipine Discussed BP goal of <130/80 Reinforced DASH diet and exercise for weight loss

## 2023-06-19 NOTE — Progress Notes (Signed)
Subjective:    Patient ID: Gail Perez, female    DOB: 1991-06-26, 32 y.o.   MRN: [id number]  HPI  Patient presents to clinic today for follow-up of HTN.  At her last visit, she was started on amlodipine 10 mg daily.  She has been taking the medication as prescribed.  Her BP today is 136/86.  There is no ECG on file.  Review of Systems     Past Medical History:  Diagnosis Date   Allergy    Chlamydia    Hx of varicella    Pregnancy induced hypertension 08/22/2012   Seasonal allergies    Urinary tract infection    Vaginal Pap smear, abnormal     Current Outpatient Medications  Medication Sig Dispense Refill   amLODipine (NORVASC) 10 MG tablet Take 1 tablet (10 mg total) by mouth daily. 90 tablet 1   No current facility-administered medications for this visit.    No Known Allergies  Family History  Problem Relation Age of Onset   Anemia Mother    Stroke Father    Hypertension Father    Hyperlipidemia Father    Heart disease Father        enlatged heart   Sarcoidosis Father    Hypertension Sister    Lung cancer Maternal Grandfather    Breast cancer Paternal Grandmother    Other Neg Hx     Social History   Socioeconomic History   Marital status: Married    Spouse name: Orion   Number of children: Not on file   Years of education: Not on file   Highest education level: Master's degree (e.g., MA, MS, MEng, MEd, MSW, MBA)  Occupational History   Occupation: Product manager    Comment: [employer]  Tobacco Use   Smoking status: Never    Passive exposure: Never   Smokeless tobacco: Never  Vaping Use   Vaping status: Never Used  Substance and Sexual Activity   Alcohol use: Not Currently    Comment: occasional   Drug use: No   Sexual activity: Not Currently    Birth control/protection: None  Other Topics Concern   Not on file  Social History Narrative   Not on file   Social Determinants of Health   Financial Resource Strain: Low Risk  (05/11/2023)   Overall  Financial Resource Strain (CARDIA)    Difficulty of Paying Living Expenses: Not very hard  Food Insecurity: No Food Insecurity (05/11/2023)   Hunger Vital Sign    Worried About Running Out of Food in the Last Year: Never true    Ran Out of Food in the Last Year: Never true  Transportation Needs: No Transportation Needs (05/11/2023)   PRAPARE - Administrator, Civil Service (Medical): No    Lack of Transportation (Non-Medical): No  Physical Activity: Insufficiently Active (05/11/2023)   Exercise Vital Sign    Days of Exercise per Week: 3 days    Minutes of Exercise per Session: 30 min  Stress: Stress Concern Present (05/11/2023)   [employer] of Occupational Health - Occupational Stress Questionnaire    Feeling of Stress : To some extent  Social Connections: Socially Integrated (05/11/2023)   Social Connection and Isolation Panel [NHANES]    Frequency of Communication with Friends and Family: Three times a week    Frequency of Social Gatherings with Friends and Family: Twice a week    Attends Religious Services: More than 4 times per year    Active Member of  Clubs or Organizations: Yes    Attends Banker Meetings: More than 4 times per year    Marital Status: Married  Catering manager Violence: Not At Risk (08/27/2022)   Humiliation, Afraid, Rape, and Kick questionnaire    Fear of Current or Ex-Partner: No    Emotionally Abused: No    Physically Abused: No    Sexually Abused: No     Constitutional: Denies fever, malaise, fatigue, headache or abrupt weight changes.  Respiratory: Denies difficulty breathing, shortness of breath, cough or sputum production.   Cardiovascular: Denies chest pain, chest tightness, palpitations or swelling in the hands or feet.  Neurological: Denies dizziness, difficulty with memory, difficulty with speech or problems with balance and coordination.    No other specific complaints in a complete review of systems (except as listed  in HPI above).  Objective:   Physical Exam  BP 136/86   Ht 5\' 9"  (1.753 m)   Wt 210 lb (95.3 kg)   BMI 31.01 kg/m   Wt Readings from Last 3 Encounters:  05/29/23 209 lb (94.8 kg)  05/11/23 207 lb (93.9 kg)  10/28/22 209 lb (94.8 kg)    General: Appears her stated age, obese in NAD. Cardiovascular: Normal rate and rhythm. S1,S2 noted.  No murmur, rubs or gallops noted.  Pulmonary/Chest: Normal effort and positive vesicular breath sounds. No respiratory distress. No wheezes, rales or ronchi noted.  Neurological: Alert and oriented. Coordination normal.     BMET    Component Value Date/Time   NA 139 05/11/2023 1526   NA 136 02/15/2022 1547   K 4.6 05/11/2023 1526   CL 104 05/11/2023 1526   CO2 28 05/11/2023 1526   GLUCOSE 96 05/11/2023 1526   BUN 10 05/11/2023 1526   BUN 9 02/15/2022 1547   CREATININE 0.89 05/11/2023 1526   CALCIUM 9.8 05/11/2023 1526   GFRNONAA >60 08/02/2021 0847   GFRAA >60 12/05/2017 1333    Lipid Panel     Component Value Date/Time   CHOL 142 05/11/2023 1526   TRIG 121 05/11/2023 1526   HDL 52 05/11/2023 1526   CHOLHDL 2.7 05/11/2023 1526   LDLCALC 70 05/11/2023 1526    CBC    Component Value Date/Time   WBC 7.2 05/11/2023 1526   RBC 5.13 (H) 05/11/2023 1526   HGB 13.9 05/11/2023 1526   HGB 11.4 06/09/2022 0911   HCT 43.1 05/11/2023 1526   HCT 34.5 06/09/2022 0911   PLT 424 (H) 05/11/2023 1526   PLT 280 06/09/2022 0911   MCV 84.0 05/11/2023 1526   MCV 81 06/09/2022 0911   MCH 27.1 05/11/2023 1526   MCHC 32.3 05/11/2023 1526   RDW 13.3 05/11/2023 1526   RDW 13.1 06/09/2022 0911   LYMPHSABS 1.6 02/15/2022 1547   MONOABS 0.5 08/02/2021 0847   EOSABS 0.1 02/15/2022 1547   BASOSABS 0.1 02/15/2022 1547    Hgb A1C Lab Results  Component Value Date   HGBA1C 5.6 05/11/2023           Assessment & Plan:     RTC in 6 months, follow-up chronic conditions, 1 year for your annual exam Nicki Reaper, NP

## 2023-06-19 NOTE — Patient Instructions (Signed)
Cooking With Less Salt Cooking with less salt is one way to reduce the amount of salt (sodium) you get from food. Most people should have less than 2,300 milligrams (mg) of sodium each day. If you have high blood pressure (hypertension), you may need to limit your sodium to 1,500 mg each day. Follow the tips below to help reduce your sodium intake. What are tips for eating less sodium? Reading food labels  Check the food label before buying or using packaged ingredients. Always check the label for the serving size and sodium content. Choose products with less than 140 mg of sodium per serving. Check the % Daily Value column to see what percent of the daily recommended amount of sodium is in one serving of the product. Foods with 5% or less are low in sodium. Foods with 20% or more are high in sodium. Do not choose foods that have salt as one of the first three ingredients on the ingredients list. Always check how much sodium is in a product, even if the label says "unsalted" or "no salt added." Shopping Buy sodium-free or low-sodium products. Look for these words: Low-sodium. Sodium-free. Reduced-sodium. No salt added. Unsalted. Buy fresh or frozen foods without sauces or additives. Cooking Instead of salt, use herbs, seasonings without salt, and spices. Use sodium-free baking soda. Grill, braise, or roast foods to add flavor with less salt. Do not add salt to pasta, rice, or hot cereals. Drain and rinse canned vegetables, beans, and meat before use. Do not add salt when cooking sweets and desserts. Cook with low-sodium ingredients. Meal planning The sodium in bread can add up. Try to plan meals with other grains. These may include whole oats, quinoa, whole wheat pasta, and other whole grains that do not have sodium added to them. What foods are high in sodium? Vegetables Regular canned vegetables, except low-sodium or reduced-sodium items. Sauerkraut, pickled vegetables, and relishes.  Olives. French fries. Onion rings. Regular canned tomato sauce and paste. Regular tomato and vegetable juice. Frozen vegetables in sauces. Grains Instant hot cereals. Bread stuffing, pancake, and biscuit mixes. Croutons. Seasoned rice or pasta mixes. Noodle soup cups. Boxed or frozen macaroni and cheese. Regular salted crackers. Self-rising flour. Rolls. Bagels. Flour tortillas and wraps. Meats and other proteins Meat or fish that is salted, canned, smoked, cured, spiced, or pickled. Precooked or cured meat, such as sausages or meat loaves. Bacon. Ham. Pepperoni. Hot dogs. Corned beef. Chipped beef. Salt pork. Jerky. Pickled herring, anchovies, and sardines. Regular canned tuna. Salted nuts. Dairy Processed cheese and cheese spreads. Hard cheeses. Cheese curds. Blue cheese. Feta cheese. String cheese. Regular cottage cheese. Buttermilk. Canned milk. The items listed above may not be a full list of foods high in sodium. Talk to a dietitian to learn more. What foods are low in sodium? Fruits Fresh, frozen, or canned fruit with no sauce added. Fruit juice. Vegetables Fresh or frozen vegetables with no sauce added. "No salt added" canned vegetables. "No salt added" tomato sauce and paste. Low-sodium or reduced-sodium tomato and vegetable juice. Grains Noodles, pasta, quinoa, rice. Shredded or puffed wheat or puffed rice. Regular or quick oats (not instant). Low-sodium crackers. Low-sodium bread. Whole grain bread and whole grain pasta. Unsalted popcorn. Meats and other proteins Fresh or frozen whole meats, poultry that has not been injected with sodium, and fish with no sauce added. Unsalted nuts. Dried peas, beans, and lentils without added salt. Unsalted canned beans. Eggs. Unsalted nut butters. Low-sodium canned tuna or chicken. Dairy   Milk. Soy milk. Yogurt. Low-sodium cheeses, such as Swiss, Monterey Jack, mozzarella, and ricotta. Sherbet or ice cream (keep to  cup per serving). Cream  cheese. Fats and oils Unsalted butter or margarine. Other foods Homemade pudding. Sodium-free baking soda and baking powder. Herbs and spices. Low-sodium seasoning mixes. Beverages Coffee and tea. Carbonated beverages. The items listed above may not be a full list of foods low in sodium. Talk to a dietitian to learn more. What are some salt alternatives when cooking? Herbs, seasonings, and spices can be used instead of salt to flavor your food. Herbs should be fresh or dried. Do not choose packaged mixes. Next to the name of the herb, spice, or seasoning below are some foods you can pair it with. Herbs [place] leaves - Soups, meat and vegetable dishes, and spaghetti sauce. Basil - Italian dishes, soups, pasta, and fish dishes. Cilantro - Meat, poultry, and vegetable dishes. Chili powder - Marinades and Mexican dishes. Chives - Salad dressings and potato dishes. Cumin - Mexican dishes, couscous, and meat dishes. Dill - Fish dishes, sauces, and salads. Fennel - Meat and vegetable dishes, breads, and cookies. Garlic (do not use garlic salt) - Italian dishes, meat dishes, salad dressings, and sauces. Marjoram - Soups, potato dishes, and meat dishes. Oregano - Pizza and spaghetti sauce. Parsley - Salads, soups, pasta, and meat dishes. Rosemary - Italian dishes, salad dressings, soups, and red meats. Saffron - Fish dishes, pasta, and some poultry dishes. Sage - Stuffings and sauces. Tarragon - Fish and poultry dishes. Thyme - Stuffing, meat, and fish dishes. Seasonings Lemon juice - Fish dishes, poultry dishes, vegetables, and salads. Vinegar - Salad dressings, vegetables, and fish dishes. Spices Cinnamon - Sweet dishes, such as cakes, cookies, and puddings. Cloves - Gingerbread, puddings, and marinades for meats. Curry - Vegetable dishes, fish and poultry dishes, and stir-fry dishes. Ginger - Vegetable dishes, fish dishes, and stir-fry dishes. Nutmeg - Pasta, vegetables, poultry, fish  dishes, and custard. This information is not intended to replace advice given to you by your health care provider. Make sure you discuss any questions you have with your health care provider. Document Revised: 09/01/2022 Document Reviewed: 08/25/2022 Elsevier Patient Education  2024 Elsevier Inc.  

## 2023-06-19 NOTE — Assessment & Plan Note (Signed)
Encouraged diet and exercise for weight loss ?

## 2023-10-10 ENCOUNTER — Ambulatory Visit: Payer: Self-pay

## 2023-10-10 NOTE — Progress Notes (Signed)
Nutrition: 10/10/2023  CC: "I am a mother of 4 children and I need to learn how to eat healthier."  HX:  HT: 5'9" WT: WT: 210 lb  BMI: 31 Noted at the end of the visit that she had weighed on my scales and today weighed 202 lb. WT: 202  BMI: 29.6 Mother of 4 children with the oldest being 9 years. Children are active in school and sports. She and her husband support the children in school and activities. As a couple they are active in church and with a teen support group. Has been dx with HTN and is currently on Amlodipine. Notes she is currently trying to prepare meals and eat better herself and work this into her schedule.  Assessment: Diet Recall: B: 8:15 Cup of coffee with creamer and brown sugar. Rarely Gail Perez have a quick bowl of oatmeal. L: 1:00 Eats at the cafe on campus.  Small salad with an entree or small salad, cup of soup, pasta (pasta, tomatoes,  spinach, mushrooms). Old fashioned Lemonade to drink. D: 7:45-8:00 (after sports games): warmed over hamburger steak, flavored green tea, and a pepsi One. Snack: Piece of cake and sipping on green tea.  Recommendations: To incorporate more veggie into the children's diet, consider red tomato sauces with adding steamed squash, zucchini, carrots processed in a blender or food processor and added to the tomato sauce.   Add wild rice to the white rice for more fiber and vitamins/minerals. Use fruit for children's desserts/ Add [place] blend to the pasta at to see if it is accepted. Move more.  Walk as much as possible.  When at children's games, walk around the park.   In fall/winter, try to plan weekend hikes in cold weather that will help with your weight loss journey and give the children a change. Eat a real dinner with less Mom pick-up and make-do meals.  Teaching Materials: Patent attorney [employer] Food Group Healthy Eating Tips.  Follow-up: As desired.  Call for an appointment.

## 2023-12-18 ENCOUNTER — Ambulatory Visit: Payer: Self-pay | Admitting: Internal Medicine

## 2023-12-18 NOTE — Progress Notes (Deleted)
 Subjective:    Patient ID: Gail Perez, female    DOB: June 22, 1991, 33 y.o.   MRN: [id number]  HPI  Patient presents to clinic today for follow-up of chronic conditions.  HTN: Her BP today is.  She is taking amlodipine  as prescribed.  There is no ECG on file.  Thrombocytosis: Her last platelet count was 424, 05/2023.  She does not follow with hematology.  Review of Systems  Past Medical History:  Diagnosis Date   Allergy    Chlamydia    Hx of varicella    Pregnancy induced hypertension 08/22/2012   Seasonal allergies    Urinary tract infection    Vaginal Pap smear, abnormal     Current Outpatient Medications  Medication Sig Dispense Refill   amLODipine  (NORVASC ) 10 MG tablet Take 1 tablet (10 mg total) by mouth daily. 90 tablet 1   No current facility-administered medications for this visit.    No Known Allergies  Family History  Problem Relation Age of Onset   Anemia Mother    Stroke Father    Hypertension Father    Hyperlipidemia Father    Heart disease Father        enlatged heart   Sarcoidosis Father    Hypertension Sister    Lung cancer Maternal Grandfather    Breast cancer Paternal Grandmother    Other Neg Hx     Social History   Socioeconomic History   Marital status: Married    Spouse name: Orion   Number of children: Not on file   Years of education: Not on file   Highest education level: Master's degree (e.g., MA, MS, MEng, MEd, MSW, MBA)  Occupational History   Occupation: Product manager    Comment: [employer]  Tobacco Use   Smoking status: Never    Passive exposure: Never   Smokeless tobacco: Never  Vaping Use   Vaping status: Never Used  Substance and Sexual Activity   Alcohol use: Not Currently    Comment: occasional   Drug use: No   Sexual activity: Not Currently    Birth control/protection: None  Other Topics Concern   Not on file  Social History Narrative   Not on file   Social Drivers of Health   Financial Resource Strain:  Low Risk  (05/11/2023)   Overall Financial Resource Strain (CARDIA)    Difficulty of Paying Living Expenses: Not very hard  Food Insecurity: No Food Insecurity (05/11/2023)   Hunger Vital Sign    Worried About Running Out of Food in the Last Year: Never true    Ran Out of Food in the Last Year: Never true  Transportation Needs: No Transportation Needs (05/11/2023)   PRAPARE - Administrator, Civil Service (Medical): No    Lack of Transportation (Non-Medical): No  Physical Activity: Insufficiently Active (05/11/2023)   Exercise Vital Sign    Days of Exercise per Week: 3 days    Minutes of Exercise per Session: 30 min  Stress: Stress Concern Present (05/11/2023)   [employer] of Occupational Health - Occupational Stress Questionnaire    Feeling of Stress : To some extent  Social Connections: Socially Integrated (05/11/2023)   Social Connection and Isolation Panel [NHANES]    Frequency of Communication with Friends and Family: Three times a week    Frequency of Social Gatherings with Friends and Family: Twice a week    Attends Religious Services: More than 4 times per year    Active Member of [employer]  or Organizations: Yes    Attends Banker Meetings: More than 4 times per year    Marital Status: Married  Catering manager Violence: Not At Risk (08/27/2022)   Humiliation, Afraid, Rape, and Kick questionnaire    Fear of Current or Ex-Partner: No    Emotionally Abused: No    Physically Abused: No    Sexually Abused: No     Constitutional: Denies fever, malaise, fatigue, headache or abrupt weight changes.  HEENT: Denies eye pain, eye redness, ear pain, ringing in the ears, wax buildup, runny nose, nasal congestion, bloody nose, or sore throat. Respiratory: Denies difficulty breathing, shortness of breath, cough or sputum production.   Cardiovascular: Denies chest pain, chest tightness, palpitations or swelling in the hands or feet.  Gastrointestinal: Denies  abdominal pain, bloating, constipation, diarrhea or blood in the stool.  GU: Denies urgency, frequency, pain with urination, burning sensation, blood in urine, odor or discharge. Musculoskeletal: Denies decrease in range of motion, difficulty with gait, muscle pain or joint pain and swelling.  Skin: Denies redness, rashes, lesions or ulcercations.  Neurological: Denies dizziness, difficulty with memory, difficulty with speech or problems with balance and coordination.  Psych: Denies anxiety, depression, SI/HI.  No other specific complaints in a complete review of systems (except as listed in HPI above).     Objective:   Physical Exam   There were no vitals taken for this visit.  Wt Readings from Last 3 Encounters:  06/19/23 210 lb (95.3 kg)  05/29/23 209 lb (94.8 kg)  05/11/23 207 lb (93.9 kg)    General: Appears her stated age, obese in NAD. Skin: Warm, dry and intact.  HEENT: Head: normal shape and size; Eyes: sclera white, no icterus, conjunctiva pink, PERRLA and EOMs intact;  Neck:  Neck supple, trachea midline. No masses, lumps or thyromegaly present.  Cardiovascular: Normal rate and rhythm. S1,S2 noted.  No murmur, rubs or gallops noted. No JVD or BLE edema. Pulmonary/Chest: Normal effort and positive vesicular breath sounds. No respiratory distress. No wheezes, rales or ronchi noted.  Abdomen: Soft and nontender. Normal bowel sounds.  Musculoskeletal: Strength 5/5 BUE/BLE.  No difficulty with gait.  Neurological: Alert and oriented. Cranial nerves II-XII grossly intact. Coordination normal.  Psychiatric: Mood and affect normal. Behavior is normal. Judgment and thought content normal.    BMET    Component Value Date/Time   NA 139 05/11/2023 1526   NA 136 02/15/2022 1547   K 4.6 05/11/2023 1526   CL 104 05/11/2023 1526   CO2 28 05/11/2023 1526   GLUCOSE 96 05/11/2023 1526   BUN 10 05/11/2023 1526   BUN 9 02/15/2022 1547   CREATININE 0.89 05/11/2023 1526   CALCIUM  9.8 05/11/2023 1526   GFRNONAA >60 08/02/2021 0847   GFRAA >60 12/05/2017 1333    Lipid Panel     Component Value Date/Time   CHOL 142 05/11/2023 1526   TRIG 121 05/11/2023 1526   HDL 52 05/11/2023 1526   CHOLHDL 2.7 05/11/2023 1526   LDLCALC 70 05/11/2023 1526    CBC    Component Value Date/Time   WBC 7.2 05/11/2023 1526   RBC 5.13 (H) 05/11/2023 1526   HGB 13.9 05/11/2023 1526   HGB 11.4 06/09/2022 0911   HCT 43.1 05/11/2023 1526   HCT 34.5 06/09/2022 0911   PLT 424 (H) 05/11/2023 1526   PLT 280 06/09/2022 0911   MCV 84.0 05/11/2023 1526   MCV 81 06/09/2022 0911   MCH 27.1 05/11/2023 1526  MCHC 32.3 05/11/2023 1526   RDW 13.3 05/11/2023 1526   RDW 13.1 06/09/2022 0911   LYMPHSABS 1.6 02/15/2022 1547   MONOABS 0.5 08/02/2021 0847   EOSABS 0.1 02/15/2022 1547   BASOSABS 0.1 02/15/2022 1547    Hgb A1C Lab Results  Component Value Date   HGBA1C 5.6 05/11/2023           Assessment & Plan:     RTC in 6 months for your annual exam Helayne Lo, NP

## 2024-01-30 ENCOUNTER — Ambulatory Visit: Admitting: Internal Medicine

## 2024-02-01 ENCOUNTER — Encounter: Payer: Self-pay | Admitting: Internal Medicine

## 2024-02-01 ENCOUNTER — Ambulatory Visit (INDEPENDENT_AMBULATORY_CARE_PROVIDER_SITE_OTHER): Admitting: Internal Medicine

## 2024-02-01 VITALS — BP 140/80 | Ht 69.0 in | Wt 200.2 lb

## 2024-02-01 DIAGNOSIS — D75839 Thrombocytosis, unspecified: Secondary | ICD-10-CM | POA: Insufficient documentation

## 2024-02-01 DIAGNOSIS — M549 Dorsalgia, unspecified: Secondary | ICD-10-CM | POA: Diagnosis not present

## 2024-02-01 DIAGNOSIS — E663 Overweight: Secondary | ICD-10-CM

## 2024-02-01 DIAGNOSIS — N644 Mastodynia: Secondary | ICD-10-CM | POA: Diagnosis not present

## 2024-02-01 DIAGNOSIS — Z6829 Body mass index (BMI) 29.0-29.9, adult: Secondary | ICD-10-CM

## 2024-02-01 DIAGNOSIS — N62 Hypertrophy of breast: Secondary | ICD-10-CM | POA: Diagnosis not present

## 2024-02-01 DIAGNOSIS — I10 Essential (primary) hypertension: Secondary | ICD-10-CM

## 2024-02-01 MED ORDER — AMLODIPINE BESYLATE 10 MG PO TABS: 10.0000 mg | ORAL_TABLET | Freq: Every day | ORAL | 1 refills | Status: DC

## 2024-02-01 NOTE — Assessment & Plan Note (Signed)
 Uncontrolled off amlodipine  10 mg, discussed long-term effects of uncontrolled blood pressure including heart attack, stroke, dementia and death She will restart amlodipine  10 mg daily Reinforced DASH diet and exercise for weight loss

## 2024-02-01 NOTE — Assessment & Plan Note (Signed)
 Encouraged diet and exercise for weight loss ?

## 2024-02-01 NOTE — Patient Instructions (Signed)
 Breast Tenderness Breast tenderness is a common problem for women of all ages, but may also occur in men. Breast tenderness has many possible causes, including hormone changes, infections, taking certain medicines, and caffeine intake. In women, the pain usually comes and goes with the menstrual cycle, but it can also be constant. Breast tenderness may range from mild discomfort to severe pain. You may have tests, such as a mammogram or an ultrasound, to check for any unusual findings. Having breast tenderness usually does not mean that you have breast cancer. Follow these instructions at home: Managing pain and discomfort  If directed, put ice on the painful area. To do this: Put ice in a plastic bag. Place a towel between your skin and the bag. Leave the ice on for 20 minutes, 2-3 times a day. If your skin turns bright red, remove the ice right away to prevent skin damage. The risk of skin damage is higher if you cannot feel pain, heat, or cold. Wear a supportive bra or chest support: During exercise. While sleeping, if your breasts are very tender. Medicines Take over-the-counter and prescription medicines only as told by your health care provider. If the cause of your pain is an infection, you may be prescribed an antibiotic medicine. If you were prescribed antibiotics, take them as told by your health care provider. Do not stop using the antibiotic even if you start to feel better. Eating and drinking Decrease the amount of caffeine in your diet. Instead, drink more water and choose caffeine-free drinks. Your health care provider may recommend that you lessen the amount of fat in your diet. You can do this by: Limiting fried foods. Cooking foods using methods such as baking, boiling, grilling, and broiling. General instructions  Keep a log of the days and times when your breasts are most tender. Ask your health care provider how to do breast exams at home. This will help you notice if  you have an unusual growth or lump. Keep all follow-up visits. Contact a health care provider if: Any part of your breast is hard, red, and hot to the touch. This may be a sign of infection. You are a woman and have a new or painful lump in your breast that remains after your menstrual period ends. You are not breastfeeding and you have fluid, especially blood or pus, coming out of your nipples. You have a fever. Your pain does not improve or it gets worse. Your pain is interfering with your daily activities. Summary Breast tenderness may range from mild discomfort to severe pain. Breast tenderness has many possible causes, including hormone changes, infections, taking certain medicines, and caffeine intake. It can be treated with ice, wearing a supportive bra or chest support, and medicines. Make changes to your diet as told by your health care provider. This information is not intended to replace advice given to you by your health care provider. Make sure you discuss any questions you have with your health care provider. Document Revised: 10/20/2021 Document Reviewed: 10/20/2021 Elsevier Patient Education  2024 [employer].

## 2024-02-01 NOTE — Progress Notes (Signed)
 Subjective:    Patient ID: Gail Perez, female    DOB: [birth year], 33 y.o.   MRN: [id number]  HPI  Patient presents to clinic today for follow-up of chronic conditions.  HTN: Her BP today is 144/88.  She is not taking amlodipine  as prescribed because she just does not like having to take pills.  There is no ECG on file.  Thrombocytosis: Her last platelet count was 424, 04/2023.  She does not follow with hematology.  She also reports left breast pain. She reports this started about 2 weeks. She describes the pain aching, burning and throbbing. She has not felt a discrete mass, despite doing self breast checks. She has noticed changes in the skin, reporting that the area is more bumpy than usual. She has not noticed any discharge from the nipple. She hasn't breastfed in some time. She reports family history of breast cancer on her dad's side of the family.  Review of Systems   Past Medical History:  Diagnosis Date   Allergy    Chlamydia    Hx of varicella    Pregnancy induced hypertension 08/22/2012   Seasonal allergies    Urinary tract infection    Vaginal Pap smear, abnormal     Current Outpatient Medications  Medication Sig Dispense Refill   amLODipine  (NORVASC ) 10 MG tablet Take 1 tablet (10 mg total) by mouth daily. 90 tablet 1   No current facility-administered medications for this visit.    No Known Allergies  Family History  Problem Relation Age of Onset   Anemia Mother    Stroke Father    Hypertension Father    Hyperlipidemia Father    Heart disease Father        enlatged heart   Sarcoidosis Father    Hypertension Sister    Lung cancer Maternal Grandfather    Breast cancer Paternal Grandmother    Other Neg Hx     Social History   Socioeconomic History   Marital status: Married    Spouse name: Orion   Number of children: Not on file   Years of education: Not on file   Highest education level: Master's degree (e.g., MA, MS, MEng, MEd, MSW, MBA)   Occupational History   Occupation: Product manager    Comment: [employer]  Tobacco Use   Smoking status: Never    Passive exposure: Never   Smokeless tobacco: Never  Vaping Use   Vaping status: Never Used  Substance and Sexual Activity   Alcohol use: Not Currently    Comment: occasional   Drug use: No   Sexual activity: Not Currently    Birth control/protection: None  Other Topics Concern   Not on file  Social History Narrative   Not on file   Social Drivers of Health   Financial Resource Strain: Low Risk  (05/11/2023)   Overall Financial Resource Strain (CARDIA)    Difficulty of Paying Living Expenses: Not very hard  Food Insecurity: No Food Insecurity (05/11/2023)   Hunger Vital Sign    Worried About Running Out of Food in the Last Year: Never true    Ran Out of Food in the Last Year: Never true  Transportation Needs: No Transportation Needs (05/11/2023)   PRAPARE - Administrator, Civil Service (Medical): No    Lack of Transportation (Non-Medical): No  Physical Activity: Insufficiently Active (05/11/2023)   Exercise Vital Sign    Days of Exercise per Week: 3 days    Minutes of Exercise  per Session: 30 min  Stress: Stress Concern Present (05/11/2023)   [employer] of Occupational Health - Occupational Stress Questionnaire    Feeling of Stress : To some extent  Social Connections: Socially Integrated (05/11/2023)   Social Connection and Isolation Panel    Frequency of Communication with Friends and Family: Three times a week    Frequency of Social Gatherings with Friends and Family: Twice a week    Attends Religious Services: More than 4 times per year    Active Member of [employer] or Organizations: Yes    Attends Engineer, structural: More than 4 times per year    Marital Status: Married  Catering manager Violence: Not At Risk (08/27/2022)   Humiliation, Afraid, Rape, and Kick questionnaire    Fear of Current or Ex-Partner: No    Emotionally Abused: No     Physically Abused: No    Sexually Abused: No     Constitutional: Denies fever, malaise, fatigue, headache or abrupt weight changes.  Respiratory: Denies difficulty breathing, shortness of breath, cough or sputum production.   Cardiovascular: Denies chest pain, chest tightness, palpitations or swelling in the hands or feet.  Gastrointestinal: Patient reports abdominal pain.  Denies bloating, constipation, diarrhea or blood in the stool.  GU: Denies urgency, frequency, pain with urination, burning sensation, blood in urine, odor or discharge. Musculoskeletal: Patient reports upper back pain in between shoulder blades.  Denies decrease in range of motion, difficulty with gait, or joint swelling.  Skin: Patient reports left breast pain, indention in shoulders after she removes her bra.  Denies redness, rashes, lesions or ulcercations.  Neurological: Denies dizziness, difficulty with memory, difficulty with speech or problems with balance and coordination.  Psych: Denies anxiety, depression, SI/HI.  No other specific complaints in a complete review of systems (except as listed in HPI above).      Objective:   Physical Exam  BP (!) 140/80   Ht 5' 9 (1.753 m)   Wt 200 lb 3.2 oz (90.8 kg)   LMP 01/20/2024 (Approximate)   BMI 29.56 kg/m   Wt Readings from Last 3 Encounters:  06/19/23 210 lb (95.3 kg)  05/29/23 209 lb (94.8 kg)  05/11/23 207 lb (93.9 kg)    General: Appears her stated age, overweight, in NAD. Skin: Warm, dry and intact.  Indentions noted in bilateral shoulders from bra strap. Breast: Large and drooping but symmetrical.  No skin change noticed.  No discrete mass noted of the left breast or left axillary region. Cardiovascular: Normal rate and rhythm. S1,S2 noted.  No murmur, rubs or gallops noted. No JVD or BLE edema. Pulmonary/Chest: Normal effort and positive vesicular breath sounds. No respiratory distress. No wheezes, rales or ronchi noted.  Musculoskeletal:  Normal flexion, extension rotation of the spine.  No bony tenderness noted over the spine but pain with palpation in the left parathoracic region.  No difficulty with gait.  Neurological: Alert and oriented.   BMET    Component Value Date/Time   NA 139 05/11/2023 1526   NA 136 02/15/2022 1547   K 4.6 05/11/2023 1526   CL 104 05/11/2023 1526   CO2 28 05/11/2023 1526   GLUCOSE 96 05/11/2023 1526   BUN 10 05/11/2023 1526   BUN 9 02/15/2022 1547   CREATININE 0.89 05/11/2023 1526   CALCIUM 9.8 05/11/2023 1526   GFRNONAA >60 08/02/2021 0847   GFRAA >60 12/05/2017 1333    Lipid Panel     Component Value Date/Time   CHOL  142 05/11/2023 1526   TRIG 121 05/11/2023 1526   HDL 52 05/11/2023 1526   CHOLHDL 2.7 05/11/2023 1526   LDLCALC 70 05/11/2023 1526    CBC    Component Value Date/Time   WBC 7.2 05/11/2023 1526   RBC 5.13 (H) 05/11/2023 1526   HGB 13.9 05/11/2023 1526   HGB 11.4 06/09/2022 0911   HCT 43.1 05/11/2023 1526   HCT 34.5 06/09/2022 0911   PLT 424 (H) 05/11/2023 1526   PLT 280 06/09/2022 0911   MCV 84.0 05/11/2023 1526   MCV 81 06/09/2022 0911   MCH 27.1 05/11/2023 1526   MCHC 32.3 05/11/2023 1526   RDW 13.3 05/11/2023 1526   RDW 13.1 06/09/2022 0911   LYMPHSABS 1.6 02/15/2022 1547   MONOABS 0.5 08/02/2021 0847   EOSABS 0.1 02/15/2022 1547   BASOSABS 0.1 02/15/2022 1547    Hgb A1C Lab Results  Component Value Date   HGBA1C 5.6 05/11/2023            Assessment & Plan:  Left breast pain:  No discrete mass noted Referral for ultrasound of the left breast including axilla and diagnostic mammogram  Large breast, upper back pain:  I would strongly recommend she consider breast reduction at this time  RTC in 2 weeks, follow-up HTN 4 months for your annual exam Helayne Lo, NP

## 2024-02-01 NOTE — Assessment & Plan Note (Signed)
Will check CBC at annual exam

## 2024-02-15 ENCOUNTER — Ambulatory Visit: Admitting: Internal Medicine

## 2024-02-15 NOTE — Progress Notes (Deleted)
 Subjective:    Patient ID: Gail Perez, female    DOB: [birth year], 33 y.o.   MRN: [id number]  HPI  Patient presents to clinic today for 2-week follow-up of HTN.  At her last visit her blood pressure was elevated but she had not been taking her amlodipine  as prescribed.  She was advised to restart this.  She reports she has been taking medication as prescribed.  Her BP today is.  There is no ECG on file.  Review of Systems   Past Medical History:  Diagnosis Date   Allergy    Chlamydia    Hx of varicella    Pregnancy induced hypertension 08/22/2012   Seasonal allergies    Urinary tract infection    Vaginal Pap smear, abnormal     Current Outpatient Medications  Medication Sig Dispense Refill   amLODipine  (NORVASC ) 10 MG tablet Take 1 tablet (10 mg total) by mouth daily. 90 tablet 1   No current facility-administered medications for this visit.    No Known Allergies  Family History  Problem Relation Age of Onset   Anemia Mother    Stroke Father    Hypertension Father    Hyperlipidemia Father    Heart disease Father        enlatged heart   Sarcoidosis Father    Hypertension Sister    Lung cancer Maternal Grandfather    Breast cancer Paternal Grandmother    Other Neg Hx     Social History   Socioeconomic History   Marital status: Married    Spouse name: Orion   Number of children: Not on file   Years of education: Not on file   Highest education level: Master's degree (e.g., MA, MS, MEng, MEd, MSW, MBA)  Occupational History   Occupation: Product manager    Comment: [employer]  Tobacco Use   Smoking status: Never    Passive exposure: Never   Smokeless tobacco: Never  Vaping Use   Vaping status: Never Used  Substance and Sexual Activity   Alcohol use: Not Currently    Comment: occasional   Drug use: No   Sexual activity: Not Currently    Birth control/protection: None  Other Topics Concern   Not on file  Social History Narrative   Not on file   Social  Drivers of Health   Financial Resource Strain: Low Risk  (05/11/2023)   Overall Financial Resource Strain (CARDIA)    Difficulty of Paying Living Expenses: Not very hard  Food Insecurity: No Food Insecurity (05/11/2023)   Hunger Vital Sign    Worried About Running Out of Food in the Last Year: Never true    Ran Out of Food in the Last Year: Never true  Transportation Needs: No Transportation Needs (05/11/2023)   PRAPARE - Administrator, Civil Service (Medical): No    Lack of Transportation (Non-Medical): No  Physical Activity: Insufficiently Active (05/11/2023)   Exercise Vital Sign    Days of Exercise per Week: 3 days    Minutes of Exercise per Session: 30 min  Stress: Stress Concern Present (05/11/2023)   [employer] of Occupational Health - Occupational Stress Questionnaire    Feeling of Stress : To some extent  Social Connections: Socially Integrated (05/11/2023)   Social Connection and Isolation Panel    Frequency of Communication with Friends and Family: Three times a week    Frequency of Social Gatherings with Friends and Family: Twice a week    Attends Religious Services:  More than 4 times per year    Active Member of Clubs or Organizations: Yes    Attends Banker Meetings: More than 4 times per year    Marital Status: Married  Catering manager Violence: Not At Risk (08/27/2022)   Humiliation, Afraid, Rape, and Kick questionnaire    Fear of Current or Ex-Partner: No    Emotionally Abused: No    Physically Abused: No    Sexually Abused: No     Constitutional: Denies fever, malaise, fatigue, headache or abrupt weight changes.  Respiratory: Denies difficulty breathing, shortness of breath, cough or sputum production.   Cardiovascular: Denies chest pain, chest tightness, palpitations or swelling in the hands or feet.  Gastrointestinal:  Denies abdominal pain, bloating, constipation, diarrhea or blood in the stool.  GU: Denies urgency, frequency,  pain with urination, burning sensation, blood in urine, odor or discharge. Musculoskeletal:  Denies decrease in range of motion, difficulty with gait, or joint swelling.  Skin:  Denies redness, rashes, lesions or ulcercations.  Neurological: Denies dizziness, difficulty with memory, difficulty with speech or problems with balance and coordination.  Psych: Denies anxiety, depression, SI/HI.  No other specific complaints in a complete review of systems (except as listed in HPI above).      Objective:   Physical Exam  LMP 01/20/2024 (Approximate)   Wt Readings from Last 3 Encounters:  02/01/24 200 lb 3.2 oz (90.8 kg)  06/19/23 210 lb (95.3 kg)  05/29/23 209 lb (94.8 kg)    General: Appears her stated age, overweight, in NAD. Skin: Warm, dry and intact.  Indentions noted in bilateral shoulders from bra strap. Breast: Large and drooping but symmetrical.  No skin change noticed.  No discrete mass noted of the left breast or left axillary region. Cardiovascular: Normal rate and rhythm. S1,S2 noted.  No murmur, rubs or gallops noted. No JVD or BLE edema. Pulmonary/Chest: Normal effort and positive vesicular breath sounds. No respiratory distress. No wheezes, rales or ronchi noted.  Musculoskeletal: Normal flexion, extension rotation of the spine.  No bony tenderness noted over the spine but pain with palpation in the left parathoracic region.  No difficulty with gait.  Neurological: Alert and oriented.   BMET    Component Value Date/Time   NA 139 05/11/2023 1526   NA 136 02/15/2022 1547   K 4.6 05/11/2023 1526   CL 104 05/11/2023 1526   CO2 28 05/11/2023 1526   GLUCOSE 96 05/11/2023 1526   BUN 10 05/11/2023 1526   BUN 9 02/15/2022 1547   CREATININE 0.89 05/11/2023 1526   CALCIUM 9.8 05/11/2023 1526   GFRNONAA >60 08/02/2021 0847   GFRAA >60 12/05/2017 1333    Lipid Panel     Component Value Date/Time   CHOL 142 05/11/2023 1526   TRIG 121 05/11/2023 1526   HDL 52 05/11/2023  1526   CHOLHDL 2.7 05/11/2023 1526   LDLCALC 70 05/11/2023 1526    CBC    Component Value Date/Time   WBC 7.2 05/11/2023 1526   RBC 5.13 (H) 05/11/2023 1526   HGB 13.9 05/11/2023 1526   HGB 11.4 06/09/2022 0911   HCT 43.1 05/11/2023 1526   HCT 34.5 06/09/2022 0911   PLT 424 (H) 05/11/2023 1526   PLT 280 06/09/2022 0911   MCV 84.0 05/11/2023 1526   MCV 81 06/09/2022 0911   MCH 27.1 05/11/2023 1526   MCHC 32.3 05/11/2023 1526   RDW 13.3 05/11/2023 1526   RDW 13.1 06/09/2022 0911   LYMPHSABS  1.6 02/15/2022 1547   MONOABS 0.5 08/02/2021 0847   EOSABS 0.1 02/15/2022 1547   BASOSABS 0.1 02/15/2022 1547    Hgb A1C Lab Results  Component Value Date   HGBA1C 5.6 05/11/2023            Assessment & Plan:    RTC in 4 months for your annual exam Angeline Laura, NP

## 2024-02-16 ENCOUNTER — Ambulatory Visit
Admission: RE | Admit: 2024-02-16 | Discharge: 2024-02-16 | Disposition: A | Discharge status: Home / Self Care | Source: Ambulatory Visit | Attending: Internal Medicine | Admitting: Internal Medicine

## 2024-02-16 DIAGNOSIS — N644 Mastodynia: Secondary | ICD-10-CM

## 2024-02-16 DIAGNOSIS — R92333 Mammographic heterogeneous density, bilateral breasts: Secondary | ICD-10-CM | POA: Diagnosis not present

## 2024-02-19 ENCOUNTER — Other Ambulatory Visit

## 2024-02-19 ENCOUNTER — Ambulatory Visit: Admitting: Internal Medicine

## 2024-02-19 ENCOUNTER — Encounter

## 2024-02-19 ENCOUNTER — Encounter: Payer: Self-pay | Admitting: Internal Medicine

## 2024-02-19 VITALS — BP 138/88 | Ht 69.0 in | Wt 200.2 lb

## 2024-02-19 DIAGNOSIS — I10 Essential (primary) hypertension: Secondary | ICD-10-CM

## 2024-02-19 NOTE — Patient Instructions (Signed)

## 2024-02-19 NOTE — Assessment & Plan Note (Signed)
 Discussed BP goal of 130/80 BP improved but not quite at goal Continue amlodipine  10 mg daily Reinforced DASH diet and exercise for weight loss

## 2024-02-19 NOTE — Progress Notes (Signed)
 Subjective:    Patient ID: Gail Perez, female    DOB: [birth year], 33 y.o.   MRN: [id number]  HPI  Patient presents to clinic today for 2-week follow-up of HTN.  At her last visit her blood pressure was elevated but she had not been taking her amlodipine  as prescribed.  She was advised to restart this.  She reports she has been taking medication as prescribed.  Her BP today is 138/88.  There is no ECG on file.  Review of Systems   Past Medical History:  Diagnosis Date   Allergy    Chlamydia    Hx of varicella    Pregnancy induced hypertension 08/22/2012   Seasonal allergies    Urinary tract infection    Vaginal Pap smear, abnormal     Current Outpatient Medications  Medication Sig Dispense Refill   amLODipine  (NORVASC ) 10 MG tablet Take 1 tablet (10 mg total) by mouth daily. 90 tablet 1   No current facility-administered medications for this visit.    No Known Allergies  Family History  Problem Relation Age of Onset   Anemia Mother    Stroke Father    Hypertension Father    Hyperlipidemia Father    Heart disease Father        enlatged heart   Sarcoidosis Father    Hypertension Sister    Lung cancer Maternal Grandfather    Breast cancer Paternal Grandmother    Other Neg Hx     Social History   Socioeconomic History   Marital status: Married    Spouse name: Orion   Number of children: Not on file   Years of education: Not on file   Highest education level: Master's degree (e.g., MA, MS, MEng, MEd, MSW, MBA)  Occupational History   Occupation: Product manager    Comment: [employer]  Tobacco Use   Smoking status: Never    Passive exposure: Never   Smokeless tobacco: Never  Vaping Use   Vaping status: Never Used  Substance and Sexual Activity   Alcohol use: Not Currently    Comment: occasional   Drug use: No   Sexual activity: Not Currently    Birth control/protection: None  Other Topics Concern   Not on file  Social History Narrative   Not on file    Social Drivers of Health   Financial Resource Strain: Low Risk  (05/11/2023)   Overall Financial Resource Strain (CARDIA)    Difficulty of Paying Living Expenses: Not very hard  Food Insecurity: No Food Insecurity (05/11/2023)   Hunger Vital Sign    Worried About Running Out of Food in the Last Year: Never true    Ran Out of Food in the Last Year: Never true  Transportation Needs: No Transportation Needs (05/11/2023)   PRAPARE - Administrator, Civil Service (Medical): No    Lack of Transportation (Non-Medical): No  Physical Activity: Insufficiently Active (05/11/2023)   Exercise Vital Sign    Days of Exercise per Week: 3 days    Minutes of Exercise per Session: 30 min  Stress: Stress Concern Present (05/11/2023)   [employer] of Occupational Health - Occupational Stress Questionnaire    Feeling of Stress : To some extent  Social Connections: Socially Integrated (05/11/2023)   Social Connection and Isolation Panel    Frequency of Communication with Friends and Family: Three times a week    Frequency of Social Gatherings with Friends and Family: Twice a week    Attends Religious  Services: More than 4 times per year    Active Member of Clubs or Organizations: Yes    Attends Banker Meetings: More than 4 times per year    Marital Status: Married  Catering manager Violence: Not At Risk (08/27/2022)   Humiliation, Afraid, Rape, and Kick questionnaire    Fear of Current or Ex-Partner: No    Emotionally Abused: No    Physically Abused: No    Sexually Abused: No     Constitutional: Denies fever, malaise, fatigue, headache or abrupt weight changes.  Respiratory: Denies difficulty breathing, shortness of breath, cough or sputum production.   Cardiovascular: Denies chest pain, chest tightness, palpitations or swelling in the hands or feet.  Neurological: Denies dizziness, difficulty with memory, difficulty with speech or problems with balance and coordination.     No other specific complaints in a complete review of systems (except as listed in HPI above).      Objective:   Physical Exam  BP 138/88 (BP Location: Left Arm, Patient Position: Sitting, Cuff Size: Normal)   Ht 5' 9 (1.753 m)   Wt 200 lb 3.2 oz (90.8 kg)   LMP 02/16/2024 (Exact Date)   BMI 29.56 kg/m    Wt Readings from Last 3 Encounters:  02/01/24 200 lb 3.2 oz (90.8 kg)  06/19/23 210 lb (95.3 kg)  05/29/23 209 lb (94.8 kg)    General: Appears her stated age, overweight, in NAD. Cardiovascular: Normal rate and rhythm. S1,S2 noted.  No murmur, rubs or gallops noted. No JVD or BLE edema. Pulmonary/Chest: Normal effort and positive vesicular breath sounds. No respiratory distress. No wheezes, rales or ronchi noted.  Neurological: Alert and oriented.   BMET    Component Value Date/Time   NA 139 05/11/2023 1526   NA 136 02/15/2022 1547   K 4.6 05/11/2023 1526   CL 104 05/11/2023 1526   CO2 28 05/11/2023 1526   GLUCOSE 96 05/11/2023 1526   BUN 10 05/11/2023 1526   BUN 9 02/15/2022 1547   CREATININE 0.89 05/11/2023 1526   CALCIUM 9.8 05/11/2023 1526   GFRNONAA >60 08/02/2021 0847   GFRAA >60 12/05/2017 1333    Lipid Panel     Component Value Date/Time   CHOL 142 05/11/2023 1526   TRIG 121 05/11/2023 1526   HDL 52 05/11/2023 1526   CHOLHDL 2.7 05/11/2023 1526   LDLCALC 70 05/11/2023 1526    CBC    Component Value Date/Time   WBC 7.2 05/11/2023 1526   RBC 5.13 (H) 05/11/2023 1526   HGB 13.9 05/11/2023 1526   HGB 11.4 06/09/2022 0911   HCT 43.1 05/11/2023 1526   HCT 34.5 06/09/2022 0911   PLT 424 (H) 05/11/2023 1526   PLT 280 06/09/2022 0911   MCV 84.0 05/11/2023 1526   MCV 81 06/09/2022 0911   MCH 27.1 05/11/2023 1526   MCHC 32.3 05/11/2023 1526   RDW 13.3 05/11/2023 1526   RDW 13.1 06/09/2022 0911   LYMPHSABS 1.6 02/15/2022 1547   MONOABS 0.5 08/02/2021 0847   EOSABS 0.1 02/15/2022 1547   BASOSABS 0.1 02/15/2022 1547    Hgb A1C Lab Results   Component Value Date   HGBA1C 5.6 05/11/2023            Assessment & Plan:    RTC in 4 months for your annual exam Angeline Laura, NP

## 2024-04-01 ENCOUNTER — Telehealth (INDEPENDENT_AMBULATORY_CARE_PROVIDER_SITE_OTHER): Admitting: Internal Medicine

## 2024-04-01 DIAGNOSIS — N920 Excessive and frequent menstruation with regular cycle: Secondary | ICD-10-CM

## 2024-04-01 DIAGNOSIS — Z975 Presence of (intrauterine) contraceptive device: Secondary | ICD-10-CM | POA: Diagnosis not present

## 2024-04-01 DIAGNOSIS — N921 Excessive and frequent menstruation with irregular cycle: Secondary | ICD-10-CM | POA: Diagnosis not present

## 2024-04-01 NOTE — Patient Instructions (Signed)
 Abnormal Uterine Bleeding  Abnormal uterine bleeding is unusual bleeding from the uterus. It includes bleeding after sex, or bleeding or spotting between menstrual periods. It may also include bleeding that is heavier than normal, menstrual periods that last longer than usual, or bleeding that occurs after menopause. Abnormal uterine bleeding can affect teenagers, women in their reproductive years, pregnant women, and women who have reached menopause. Common causes of abnormal uterine bleeding include: Pregnancy. Abnormal growths within the lining of the uterus (polyps). Benign tumors or growths in the uterus (fibroids). These are not cancer. Infection. Cancer. Too much or too little of some hormones in the body (hormonal imbalances). Any type of abnormal bleeding should be checked by a health care provider. Many cases are minor and simple to treat, but others may be more serious. Treatment will depend on the cause of the bleeding and how severe it is. Follow these instructions at home: Medicines Take over-the-counter and prescription medicines only as told by your health care provider. Ask your health care provider about: Taking medicines such as aspirin and ibuprofen. These medicines can thin your blood. Do not take these medicines unless your health care provider tells you to take them. Taking over-the-counter medicines, vitamins, herbs, and supplements. If you were prescribed iron pills, take them as told by your health care provider. Iron pills help to replace iron that your body loses because of this condition. Managing constipation In cases of severe bleeding, you may be asked to increase your iron intake to treat anemia. Doing this may cause constipation. To prevent or treat constipation, you may need to: Drink enough fluid to keep your urine pale yellow. Take over-the-counter or prescription medicines. Eat foods that are high in fiber, such as beans, whole grains, and fresh fruits and  vegetables. Limit foods that are high in fat and processed sugars, such as fried or sweet foods. Activity Alter your activity to decrease bleeding if you need to change your sanitary pad more than one time every 2 hours: Lie in bed with your feet raised (elevated). Place a cold pack on your lower abdomen. Rest as much as possible until the bleeding stops or slows down. General instructions Do not use tampons, douche, or have sex until your health care provider says these things are okay. Change your sanitary pads often. Get regular exams. These include pelvic exams and cervical cancer screenings. It is up to you to get the results of any tests that are done. Ask your health care provider, or the department that is doing the tests, when your results will be ready. Monitor your condition for any changes. For 2 months, write down: When your menstrual period starts. When your menstrual period ends. When any abnormal vaginal bleeding occurs. What problems you notice. Keep all follow-up visits. This is important. Contact a health care provider if: You have bleeding that lasts for more than one week. You feel dizzy at times. You feel nauseous or you vomit. You feel light-headed or weak. You notice any other changes that show that your condition is getting worse. Get help right away if: You faint. You have bleeding that soaks through a sanitary pad every hour. You have pain in the abdomen. You have a fever or chills. You become sweaty or weak. You pass large blood clots from your vagina. These symptoms may represent a serious problem that is an emergency. Do not wait to see if the symptoms will go away. Get medical help right away. Call your local emergency services (  911 in the [place] Do not drive yourself to the hospital. Summary Abnormal uterine bleeding is unusual bleeding from the uterus. Any type of abnormal bleeding should be checked by a health care provider. Many cases are minor and  simple to treat, but others may be more serious. Treatment will depend on the cause of the bleeding and how severe it is. Get help right away if you faint, you have bleeding that soaks through a sanitary pad every hour, or you pass large blood clots from your vagina. This information is not intended to replace advice given to you by your health care provider. Make sure you discuss any questions you have with your health care provider. Document Revised: 03/13/2023 Document Reviewed: 12/08/2020 Elsevier Patient Education  2024 [employer].

## 2024-04-01 NOTE — Progress Notes (Signed)
 Virtual Visit via Video Note  I connected with Gail Perez on 04/01/24 at  9:40 AM EDT by a video enabled telemedicine application and verified that I am speaking with the correct person using two identifiers.  Location: Patient: Work Provider: Engineer, structural in this video call: Angeline Laura, NP-C and Jakhiya Brower   I discussed the limitations of evaluation and management by telemedicine and the availability of in person appointments. The patient expressed understanding and agreed to proceed.  History of Present Illness:  .Discussed the use of AI scribe software for clinical note transcription with the patient, who gave verbal consent to proceed.  Gail Perez is a 33 year old female who presents with irregular bleeding associated with her Mirena  IUD.  She has been experiencing irregular bleeding for the past three weeks. The bleeding is not heavy but is unexpected and inconsistent, occurring without a predictable pattern. Prior to this, her menstrual cycles were regular, occurring once a month and lasting about seven days.  She has a Mirena  IUD, which was placed a few weeks after the birth of her daughter in January 2024. She previously had an IUD removed due to significant pelvic pain, but currently, she is not experiencing as much pain, although she notes occasional pelvic cramping without passing clots.  She has not contacted her OB GYN regarding these symptoms and has not taken a pregnancy test.   Current Outpatient Medications  Medication Sig Dispense Refill   amLODipine  (NORVASC ) 10 MG tablet Take 1 tablet (10 mg total) by mouth daily. 90 tablet 1   No current facility-administered medications for this visit.    No Known Allergies  Family History  Problem Relation Age of Onset   Anemia Mother    Stroke Father    Hypertension Father    Hyperlipidemia Father    Heart disease Father        enlatged heart   Sarcoidosis Father    Hypertension Sister    Lung  cancer Maternal Grandfather    Breast cancer Paternal Grandmother    Other Neg Hx     Social History   Socioeconomic History   Marital status: Married    Spouse name: Orion   Number of children: Not on file   Years of education: Not on file   Highest education level: Master's degree (e.g., MA, MS, MEng, MEd, MSW, MBA)  Occupational History   Occupation: Product manager    Comment: [employer]  Tobacco Use   Smoking status: Never    Passive exposure: Never   Smokeless tobacco: Never  Vaping Use   Vaping status: Never Used  Substance and Sexual Activity   Alcohol use: Not Currently    Comment: occasional   Drug use: No   Sexual activity: Not Currently    Birth control/protection: None  Other Topics Concern   Not on file  Social History Narrative   Not on file   Social Drivers of Health   Financial Resource Strain: Low Risk  (05/11/2023)   Overall Financial Resource Strain (CARDIA)    Difficulty of Paying Living Expenses: Not very hard  Food Insecurity: No Food Insecurity (05/11/2023)   Hunger Vital Sign    Worried About Running Out of Food in the Last Year: Never true    Ran Out of Food in the Last Year: Never true  Transportation Needs: No Transportation Needs (05/11/2023)   PRAPARE - Administrator, Civil Service (Medical): No    Lack of Transportation (Non-Medical):  No  Physical Activity: Insufficiently Active (05/11/2023)   Exercise Vital Sign    Days of Exercise per Week: 3 days    Minutes of Exercise per Session: 30 min  Stress: Stress Concern Present (05/11/2023)   [employer] of Occupational Health - Occupational Stress Questionnaire    Feeling of Stress : To some extent  Social Connections: Socially Integrated (05/11/2023)   Social Connection and Isolation Panel    Frequency of Communication with Friends and Family: Three times a week    Frequency of Social Gatherings with Friends and Family: Twice a week    Attends Religious Services: More than 4  times per year    Active Member of [employer] or Organizations: Yes    Attends Engineer, structural: More than 4 times per year    Marital Status: Married  Catering manager Violence: Not At Risk (08/27/2022)   Humiliation, Afraid, Rape, and Kick questionnaire    Fear of Current or Ex-Partner: No    Emotionally Abused: No    Physically Abused: No    Sexually Abused: No     Constitutional: Denies fever, malaise, fatigue, headache or abrupt weight changes.  Respiratory: Denies difficulty breathing, shortness of breath, cough or sputum production.   Cardiovascular: Denies chest pain, chest tightness, palpitations or swelling in the hands or feet.  Gastrointestinal: Pt reports pelvic cramping. Denies abdominal pain, bloating, constipation, diarrhea or blood in the stool.  GU: Patient reports irregular menses.  Denies urgency, frequency, pain with urination, burning sensation, blood in urine, odor or discharge.  No other specific complaints in a complete review of systems (except as listed in HPI above).  Observations/Objective:  Wt Readings from Last 3 Encounters:  02/19/24 200 lb 3.2 oz (90.8 kg)  02/01/24 200 lb 3.2 oz (90.8 kg)  06/19/23 210 lb (95.3 kg)    General: Appears her stated age, well developed, well nourished in NAD. Pulmonary/Chest: Normal effort. No respiratory distress.   Neurological: Alert and oriented.   BMET    Component Value Date/Time   NA 139 05/11/2023 1526   NA 136 02/15/2022 1547   K 4.6 05/11/2023 1526   CL 104 05/11/2023 1526   CO2 28 05/11/2023 1526   GLUCOSE 96 05/11/2023 1526   BUN 10 05/11/2023 1526   BUN 9 02/15/2022 1547   CREATININE 0.89 05/11/2023 1526   CALCIUM 9.8 05/11/2023 1526   GFRNONAA >60 08/02/2021 0847   GFRAA >60 12/05/2017 1333    Lipid Panel     Component Value Date/Time   CHOL 142 05/11/2023 1526   TRIG 121 05/11/2023 1526   HDL 52 05/11/2023 1526   CHOLHDL 2.7 05/11/2023 1526   LDLCALC 70 05/11/2023 1526     CBC    Component Value Date/Time   WBC 7.2 05/11/2023 1526   RBC 5.13 (H) 05/11/2023 1526   HGB 13.9 05/11/2023 1526   HGB 11.4 06/09/2022 0911   HCT 43.1 05/11/2023 1526   HCT 34.5 06/09/2022 0911   PLT 424 (H) 05/11/2023 1526   PLT 280 06/09/2022 0911   MCV 84.0 05/11/2023 1526   MCV 81 06/09/2022 0911   MCH 27.1 05/11/2023 1526   MCHC 32.3 05/11/2023 1526   RDW 13.3 05/11/2023 1526   RDW 13.1 06/09/2022 0911   LYMPHSABS 1.6 02/15/2022 1547   MONOABS 0.5 08/02/2021 0847   EOSABS 0.1 02/15/2022 1547   BASOSABS 0.1 02/15/2022 1547    Hgb A1C Lab Results  Component Value Date   HGBA1C 5.6 05/11/2023  Assessment and Plan:  Assessment and Plan    Abnormal uterine bleeding associated with Mirena  IUD Intermittent bleeding with Mirena  IUD. Consider IUD displacement or other gynecological issues. Pregnancy possible despite IUD. - Order pelvic ultrasound to assess IUD placement. - Recommend pregnancy test to rule out pregnancy. - Advise follow-up with OB GYN if IUD is correctly placed and bleeding persists for further evaluation and consideration of alternative contraceptive methods.      RTC in 2 months for your annual exam  Follow Up Instructions:    I discussed the assessment and treatment plan with the patient. The patient was provided an opportunity to ask questions and all were answered. The patient agreed with the plan and demonstrated an understanding of the instructions.   The patient was advised to call back or seek an in-person evaluation if the symptoms worsen or if the condition fails to improve as anticipated.   Angeline Laura, NP

## 2024-04-04 ENCOUNTER — Ambulatory Visit

## 2024-04-08 ENCOUNTER — Ambulatory Visit: Payer: Self-pay

## 2024-05-29 ENCOUNTER — Encounter: Admitting: Internal Medicine

## 2024-07-29 ENCOUNTER — Encounter: Admitting: Internal Medicine

## 2024-07-29 NOTE — Progress Notes (Deleted)
 Subjective:    Patient ID: Gail Perez, female    DOB: 1991-01-07, 33 y.o.   MRN: [id number]  HPI  Patient presents to clinic today for her annual exam.   Flu: Never Tetanus: 05/2022 COVID: x 2 Pap smear: 01/2022 Dentist: biannually  Diet: She does eat meat. She consumes some fruits and veggies. She does eat fried foods. She drinks sparkling water, dt soda, coffee. Exercise: Walking  Review of Systems  Past Medical History:  Diagnosis Date   Allergy    Chlamydia    Hx of varicella    Pregnancy induced hypertension 08/22/2012   Seasonal allergies    Urinary tract infection    Vaginal Pap smear, abnormal     Current Outpatient Medications  Medication Sig Dispense Refill   amLODipine  (NORVASC ) 10 MG tablet Take 1 tablet (10 mg total) by mouth daily. 90 tablet 1   No current facility-administered medications for this visit.    No Known Allergies  Family History  Problem Relation Age of Onset   Anemia Mother    Stroke Father    Hypertension Father    Hyperlipidemia Father    Heart disease Father        enlatged heart   Sarcoidosis Father    Hypertension Sister    Lung cancer Maternal Grandfather    Breast cancer Paternal Grandmother    Other Neg Hx     Social History   Socioeconomic History   Marital status: Married    Spouse name: Orion   Number of children: Not on file   Years of education: Not on file   Highest education level: Master's degree (e.g., MA, MS, MEng, MEd, MSW, MBA)  Occupational History   Occupation: Product Manager    Comment: [employer]  Tobacco Use   Smoking status: Never    Passive exposure: Never   Smokeless tobacco: Never  Vaping Use   Vaping status: Never Used  Substance and Sexual Activity   Alcohol use: Not Currently    Comment: occasional   Drug use: No   Sexual activity: Not Currently    Birth control/protection: None  Other Topics Concern   Not on file  Social History Narrative   Not on file   Social Drivers of  Health   Financial Resource Strain: Medium Risk (04/01/2024)   Overall Financial Resource Strain (CARDIA)    Difficulty of Paying Living Expenses: Somewhat hard  Food Insecurity: No Food Insecurity (04/01/2024)   Hunger Vital Sign    Worried About Running Out of Food in the Last Year: Never true    Ran Out of Food in the Last Year: Never true  Transportation Needs: No Transportation Needs (04/01/2024)   PRAPARE - Administrator, Civil Service (Medical): No    Lack of Transportation (Non-Medical): No  Physical Activity: Insufficiently Active (04/01/2024)   Exercise Vital Sign    Days of Exercise per Week: 3 days    Minutes of Exercise per Session: 30 min  Stress: No Stress Concern Present (04/01/2024)   [employer] of Occupational Health - Occupational Stress Questionnaire    Feeling of Stress: Only a little  Social Connections: Socially Integrated (04/01/2024)   Social Connection and Isolation Panel    Frequency of Communication with Friends and Family: Three times a week    Frequency of Social Gatherings with Friends and Family: Once a week    Attends Religious Services: More than 4 times per year    Active Member of [employer]  or Organizations: Yes    Attends Banker Meetings: More than 4 times per year    Marital Status: Married  Catering Manager Violence: Not At Risk (08/27/2022)   Humiliation, Afraid, Rape, and Kick questionnaire    Fear of Current or Ex-Partner: No    Emotionally Abused: No    Physically Abused: No    Sexually Abused: No     Constitutional: Denies fever, malaise, fatigue, headache or abrupt weight changes.  HEENT: Denies eye pain, eye redness, ear pain, ringing in the ears, wax buildup, runny nose, nasal congestion, bloody nose, or sore throat. Respiratory: Denies difficulty breathing, shortness of breath, cough or sputum production.   Cardiovascular: Denies chest pain, chest tightness, palpitations or swelling in the hands or feet.   Gastrointestinal: Denies abdominal pain, bloating, constipation, diarrhea or blood in the stool.  GU: Denies urgency, frequency, pain with urination, burning sensation, blood in urine, odor or discharge. Musculoskeletal: Denies decrease in range of motion, difficulty with gait, muscle pain or joint pain and swelling.  Skin: Denies redness, rashes, lesions or ulcercations.  Neurological: Denies dizziness, difficulty with memory, difficulty with speech or problems with balance and coordination.  Psych: Denies anxiety, depression, SI/HI.  No other specific complaints in a complete review of systems (except as listed in HPI above).     Objective:   Physical Exam   There were no vitals taken for this visit.  Wt Readings from Last 3 Encounters:  02/19/24 200 lb 3.2 oz (90.8 kg)  02/01/24 200 lb 3.2 oz (90.8 kg)  06/19/23 210 lb (95.3 kg)    General: Appears her stated age, obese in NAD. Skin: Warm, dry and intact.  HEENT: Head: normal shape and size; Eyes: sclera white, no icterus, conjunctiva pink, PERRLA and EOMs intact;  Neck:  Neck supple, trachea midline. No masses, lumps or thyromegaly present.  Cardiovascular: Normal rate and rhythm. S1,S2 noted.  No murmur, rubs or gallops noted. No JVD or BLE edema. Pulmonary/Chest: Normal effort and positive vesicular breath sounds. No respiratory distress. No wheezes, rales or ronchi noted.  Abdomen: Soft and nontender. Normal bowel sounds.  Musculoskeletal: Strength 5/5 BUE/BLE.  No difficulty with gait.  Neurological: Alert and oriented. Cranial nerves II-XII grossly intact. Coordination normal.  Psychiatric: Mood and affect normal. Behavior is normal. Judgment and thought content normal.    BMET    Component Value Date/Time   NA 139 05/11/2023 1526   NA 136 02/15/2022 1547   K 4.6 05/11/2023 1526   CL 104 05/11/2023 1526   CO2 28 05/11/2023 1526   GLUCOSE 96 05/11/2023 1526   BUN 10 05/11/2023 1526   BUN 9 02/15/2022 1547    CREATININE 0.89 05/11/2023 1526   CALCIUM 9.8 05/11/2023 1526   GFRNONAA >60 08/02/2021 0847   GFRAA >60 12/05/2017 1333    Lipid Panel     Component Value Date/Time   CHOL 142 05/11/2023 1526   TRIG 121 05/11/2023 1526   HDL 52 05/11/2023 1526   CHOLHDL 2.7 05/11/2023 1526   LDLCALC 70 05/11/2023 1526    CBC    Component Value Date/Time   WBC 7.2 05/11/2023 1526   RBC 5.13 (H) 05/11/2023 1526   HGB 13.9 05/11/2023 1526   HGB 11.4 06/09/2022 0911   HCT 43.1 05/11/2023 1526   HCT 34.5 06/09/2022 0911   PLT 424 (H) 05/11/2023 1526   PLT 280 06/09/2022 0911   MCV 84.0 05/11/2023 1526   MCV 81 06/09/2022 0911  MCH 27.1 05/11/2023 1526   MCHC 32.3 05/11/2023 1526   RDW 13.3 05/11/2023 1526   RDW 13.1 06/09/2022 0911   LYMPHSABS 1.6 02/15/2022 1547   MONOABS 0.5 08/02/2021 0847   EOSABS 0.1 02/15/2022 1547   BASOSABS 0.1 02/15/2022 1547    Hgb A1C Lab Results  Component Value Date   HGBA1C 5.6 05/11/2023           Assessment & Plan:   Preventative health maintenance:  Flu shot declined Tetanus UTD Encouraged her to get her COVID-vaccine Pap smear UTD Encouraged her to consume a balanced diet and exercise regimen Advised her to see an eye doctor and dentist annually Will check CBC, c-Met, lipid, A1c today  RTC in 3 weeks for followup of HTN Angeline Laura, NP

## 2024-07-30 ENCOUNTER — Ambulatory Visit: Admitting: Internal Medicine

## 2024-07-30 VITALS — BP 140/80 | Ht 69.0 in | Wt 207.4 lb

## 2024-07-30 DIAGNOSIS — I10 Essential (primary) hypertension: Secondary | ICD-10-CM

## 2024-07-30 DIAGNOSIS — Z0001 Encounter for general adult medical examination with abnormal findings: Secondary | ICD-10-CM

## 2024-07-30 DIAGNOSIS — E6609 Other obesity due to excess calories: Secondary | ICD-10-CM

## 2024-07-30 DIAGNOSIS — R739 Hyperglycemia, unspecified: Secondary | ICD-10-CM

## 2024-07-30 DIAGNOSIS — E66811 Obesity, class 1: Secondary | ICD-10-CM

## 2024-07-30 DIAGNOSIS — Z136 Encounter for screening for cardiovascular disorders: Secondary | ICD-10-CM

## 2024-07-30 DIAGNOSIS — Z6830 Body mass index (BMI) 30.0-30.9, adult: Secondary | ICD-10-CM

## 2024-07-30 MED ORDER — AMLODIPINE-OLMESARTAN 10-20 MG PO TABS: 1.0000 | ORAL_TABLET | Freq: Every day | ORAL | 1 refills | Status: AC

## 2024-07-30 NOTE — Assessment & Plan Note (Signed)
 Complicated by obesity Deteriorated We will change amlodipine  10 mg to amlodipine -olmesartan  10-20 mg daily Reinforced DASH diet and exercise for weight loss C-Met today

## 2024-07-30 NOTE — Progress Notes (Signed)
 Subjective:    Patient ID: Gail Perez, female    DOB: Jan 06, 1991, 33 y.o.   MRN: [id number]  HPI  Patient presents to clinic today for her annual exam.  Note, her BP today is 142/88.  She is taking amlodipine  as prescribed.  Flu: Never Tetanus: 05/2022 COVID: x 2 Pap smear: 01/2022 Dentist: biannually  Diet: She does eat meat. She consumes some fruits and veggies. She does eat fried foods. She drinks sparkling water, dt soda, coffee. Exercise: Walking  Review of Systems  Past Medical History:  Diagnosis Date   Allergy    Chlamydia    Hx of varicella    Pregnancy induced hypertension 08/22/2012   Seasonal allergies    Urinary tract infection    Vaginal Pap smear, abnormal     Current Outpatient Medications  Medication Sig Dispense Refill   amLODipine  (NORVASC ) 10 MG tablet Take 1 tablet (10 mg total) by mouth daily. 90 tablet 1   No current facility-administered medications for this visit.    No Known Allergies  Family History  Problem Relation Age of Onset   Anemia Mother    Stroke Father    Hypertension Father    Hyperlipidemia Father    Heart disease Father        enlatged heart   Sarcoidosis Father    Hypertension Sister    Lung cancer Maternal Grandfather    Breast cancer Paternal Grandmother    Other Neg Hx     Social History   Socioeconomic History   Marital status: Married    Spouse name: Orion   Number of children: Not on file   Years of education: Not on file   Highest education level: Master's degree (e.g., MA, MS, MEng, MEd, MSW, MBA)  Occupational History   Occupation: Product Manager    Comment: [employer]  Tobacco Use   Smoking status: Never    Passive exposure: Never   Smokeless tobacco: Never  Vaping Use   Vaping status: Never Used  Substance and Sexual Activity   Alcohol use: Not Currently    Comment: occasional   Drug use: No   Sexual activity: Not Currently    Birth control/protection: None  Other Topics Concern   Not on  file  Social History Narrative   Not on file   Social Drivers of Health   Financial Resource Strain: Medium Risk (04/01/2024)   Overall Financial Resource Strain (CARDIA)    Difficulty of Paying Living Expenses: Somewhat hard  Food Insecurity: No Food Insecurity (04/01/2024)   Hunger Vital Sign    Worried About Running Out of Food in the Last Year: Never true    Ran Out of Food in the Last Year: Never true  Transportation Needs: No Transportation Needs (04/01/2024)   PRAPARE - Administrator, Civil Service (Medical): No    Lack of Transportation (Non-Medical): No  Physical Activity: Insufficiently Active (04/01/2024)   Exercise Vital Sign    Days of Exercise per Week: 3 days    Minutes of Exercise per Session: 30 min  Stress: No Stress Concern Present (04/01/2024)   [employer] of Occupational Health - Occupational Stress Questionnaire    Feeling of Stress: Only a little  Social Connections: Socially Integrated (04/01/2024)   Social Connection and Isolation Panel    Frequency of Communication with Friends and Family: Three times a week    Frequency of Social Gatherings with Friends and Family: Once a week    Attends Religious Services:  More than 4 times per year    Active Member of Clubs or Organizations: Yes    Attends Banker Meetings: More than 4 times per year    Marital Status: Married  Catering Manager Violence: Not At Risk (08/27/2022)   Humiliation, Afraid, Rape, and Kick questionnaire    Fear of Current or Ex-Partner: No    Emotionally Abused: No    Physically Abused: No    Sexually Abused: No     Constitutional: Denies fever, malaise, fatigue, headache or abrupt weight changes.  HEENT: Denies eye pain, eye redness, ear pain, ringing in the ears, wax buildup, runny nose, nasal congestion, bloody nose, or sore throat. Respiratory: Denies difficulty breathing, shortness of breath, cough or sputum production.   Cardiovascular: Denies chest pain,  chest tightness, palpitations or swelling in the hands or feet.  Gastrointestinal: Denies abdominal pain, bloating, constipation, diarrhea or blood in the stool.  GU: Denies urgency, frequency, pain with urination, burning sensation, blood in urine, odor or discharge. Musculoskeletal: Denies decrease in range of motion, difficulty with gait, muscle pain or joint pain and swelling.  Skin: Denies redness, rashes, lesions or ulcercations.  Neurological: Denies dizziness, difficulty with memory, difficulty with speech or problems with balance and coordination.  Psych: Denies anxiety, depression, SI/HI.  No other specific complaints in a complete review of systems (except as listed in HPI above).     Objective:   Physical Exam  BP (!) 142/88 (BP Location: Left Arm, Patient Position: Sitting, Cuff Size: Large)   Ht 5' 9 (1.753 m)   Wt 207 lb 6.4 oz (94.1 kg)   LMP 07/28/2024 (Approximate)   BMI 30.63 kg/m    Wt Readings from Last 3 Encounters:  02/19/24 200 lb 3.2 oz (90.8 kg)  02/01/24 200 lb 3.2 oz (90.8 kg)  06/19/23 210 lb (95.3 kg)    General: Appears her stated age, obese in NAD. Skin: Warm, dry and intact.  HEENT: Head: normal shape and size; Eyes: sclera white, no icterus, conjunctiva pink, PERRLA and EOMs intact;  Neck:  Neck supple, trachea midline. No masses, lumps present.  Thyromegaly noted without discrete nodule. Cardiovascular: Normal rate and rhythm. S1,S2 noted.  No murmur, rubs or gallops noted. No JVD or BLE edema. Pulmonary/Chest: Normal effort and positive vesicular breath sounds. No respiratory distress. No wheezes, rales or ronchi noted.  Abdomen: Normal bowel sounds.  Musculoskeletal: Strength 5/5 BUE/BLE.  No difficulty with gait.  Neurological: Alert and oriented. Cranial nerves II-XII grossly intact. Coordination normal.  Psychiatric: Mood and affect normal. Behavior is normal. Judgment and thought content normal.    BMET    Component Value Date/Time    NA 139 05/11/2023 1526   NA 136 02/15/2022 1547   K 4.6 05/11/2023 1526   CL 104 05/11/2023 1526   CO2 28 05/11/2023 1526   GLUCOSE 96 05/11/2023 1526   BUN 10 05/11/2023 1526   BUN 9 02/15/2022 1547   CREATININE 0.89 05/11/2023 1526   CALCIUM 9.8 05/11/2023 1526   GFRNONAA >60 08/02/2021 0847   GFRAA >60 12/05/2017 1333    Lipid Panel     Component Value Date/Time   CHOL 142 05/11/2023 1526   TRIG 121 05/11/2023 1526   HDL 52 05/11/2023 1526   CHOLHDL 2.7 05/11/2023 1526   LDLCALC 70 05/11/2023 1526    CBC    Component Value Date/Time   WBC 7.2 05/11/2023 1526   RBC 5.13 (H) 05/11/2023 1526   HGB 13.9 05/11/2023 1526  HGB 11.4 06/09/2022 0911   HCT 43.1 05/11/2023 1526   HCT 34.5 06/09/2022 0911   PLT 424 (H) 05/11/2023 1526   PLT 280 06/09/2022 0911   MCV 84.0 05/11/2023 1526   MCV 81 06/09/2022 0911   MCH 27.1 05/11/2023 1526   MCHC 32.3 05/11/2023 1526   RDW 13.3 05/11/2023 1526   RDW 13.1 06/09/2022 0911   LYMPHSABS 1.6 02/15/2022 1547   MONOABS 0.5 08/02/2021 0847   EOSABS 0.1 02/15/2022 1547   BASOSABS 0.1 02/15/2022 1547    Hgb A1C Lab Results  Component Value Date   HGBA1C 5.6 05/11/2023           Assessment & Plan:   Preventative health maintenance:  Flu shot declined Tetanus UTD Encouraged her to get her COVID-vaccine Pap smear UTD Encouraged her to consume a balanced diet and exercise regimen Advised her to see an eye doctor and dentist annually Will check CBC, c-Met, lipid, A1c today  RTC in 2 weeks for followup of HTN, 6 months, follow-up chronic conditions Angeline Laura, NP

## 2024-07-30 NOTE — Patient Instructions (Signed)

## 2024-07-30 NOTE — Assessment & Plan Note (Signed)
 Encouraged diet and exercise for weight loss ?

## 2024-07-31 ENCOUNTER — Ambulatory Visit: Payer: Self-pay | Admitting: Internal Medicine

## 2024-07-31 LAB — LIPID PANEL
Cholesterol: 157 mg/dL (ref <200)
HDL: 58 mg/dL (ref >=50)
LDL Cholesterol (Calc): 75 mg/dL
Non-HDL Cholesterol (Calc): 99 mg/dL (ref <130)
Total CHOL/HDL Ratio: 2.7 (calc) (ref <5.0)
Triglycerides: 161 mg/dL — ABNORMAL HIGH (ref <150)

## 2024-07-31 LAB — CBC
HCT: 44.8 % (ref 35.9–46.0)
Hemoglobin: 14.5 g/dL (ref 11.7–15.5)
MCH: 28 pg (ref 27.0–33.0)
MCHC: 32.4 g/dL (ref 31.6–35.4)
MCV: 86.5 fL (ref 81.4–101.7)
MPV: 10.3 fL (ref 7.5–12.5)
Platelets: 422 Thousand/uL — ABNORMAL HIGH (ref 140–400)
RBC: 5.18 Million/uL — ABNORMAL HIGH (ref 3.80–5.10)
RDW: 13 % (ref 11.0–15.0)
WBC: 6.7 Thousand/uL (ref 3.8–10.8)

## 2024-07-31 LAB — COMPREHENSIVE METABOLIC PANEL WITH GFR
AG Ratio: 1.2 (calc) (ref 1.0–2.5)
ALT: 12 U/L (ref 6–29)
AST: 17 U/L (ref 10–30)
Albumin: 4.4 g/dL (ref 3.6–5.1)
Alkaline phosphatase (APISO): 56 U/L (ref 31–125)
BUN: 12 mg/dL (ref 7–25)
CO2: 26 mmol/L (ref 20–32)
Calcium: 9.7 mg/dL (ref 8.6–10.2)
Chloride: 104 mmol/L (ref 98–110)
Creat: 0.95 mg/dL (ref 0.50–0.97)
Globulin: 3.6 g/dL (ref 1.9–3.7)
Glucose, Bld: 72 mg/dL (ref 65–99)
Potassium: 4.8 mmol/L (ref 3.5–5.3)
Sodium: 139 mmol/L (ref 135–146)
Total Bilirubin: 0.6 mg/dL (ref 0.2–1.2)
Total Protein: 8 g/dL (ref 6.1–8.1)
eGFR: 80 mL/min/1.73m2 (ref >=60)

## 2024-07-31 LAB — HEMOGLOBIN A1C
Hgb A1c MFr Bld: 5.3 % (ref <5.7)
Mean Plasma Glucose: 105 mg/dL
eAG (mmol/L): 5.8 mmol/L

## 2024-08-12 NOTE — Progress Notes (Deleted)
 "  Subjective:    Patient ID: Gail Perez, female    DOB: 12-20-1990, 33 y.o.   MRN: [id number]  HPI   Review of Systems  Past Medical History:  Diagnosis Date   Allergy    Chlamydia    GERD (gastroesophageal reflux disease)    Hx of varicella    Pregnancy induced hypertension 08/22/2012   Seasonal allergies    Urinary tract infection    Vaginal Pap smear, abnormal     Current Outpatient Medications  Medication Sig Dispense Refill   amlodipine -olmesartan  (AZOR ) 10-20 MG tablet Take 1 tablet by mouth daily. 90 tablet 1   No current facility-administered medications for this visit.    No Known Allergies  Family History  Problem Relation Age of Onset   Anemia Mother    Stroke Father    Hypertension Father    Hyperlipidemia Father    Heart disease Father        enlatged heart   Sarcoidosis Father    Hypertension Sister    Lung cancer Maternal Grandfather    Breast cancer Paternal Grandmother    Other Neg Hx     Social History   Socioeconomic History   Marital status: Married    Spouse name: Orion   Number of children: Not on file   Years of education: Not on file   Highest education level: Master's degree (e.g., MA, MS, MEng, MEd, MSW, MBA)  Occupational History   Occupation: Product Manager    Comment: [employer]  Tobacco Use   Smoking status: Never    Passive exposure: Never   Smokeless tobacco: Never  Vaping Use   Vaping status: Never Used  Substance and Sexual Activity   Alcohol use: Not Currently    Comment: occasional   Drug use: Never   Sexual activity: Yes    Birth control/protection: I.U.D.  Other Topics Concern   Not on file  Social History Narrative   Not on file   Social Drivers of Health   Tobacco Use: Low Risk (07/30/2024)   Patient History    Smoking Tobacco Use: Never    Smokeless Tobacco Use: Never    Passive Exposure: Never  Financial Resource Strain: Low Risk (07/30/2024)   Overall Financial Resource Strain (CARDIA)    Difficulty  of Paying Living Expenses: Not hard at all  Food Insecurity: No Food Insecurity (07/30/2024)   Epic    Worried About Programme Researcher, Broadcasting/film/video in the Last Year: Never true    Ran Out of Food in the Last Year: Never true  Transportation Needs: No Transportation Needs (07/30/2024)   Epic    Lack of Transportation (Medical): No    Lack of Transportation (Non-Medical): No  Physical Activity: Insufficiently Active (07/30/2024)   Exercise Vital Sign    Days of Exercise per Week: 3 days    Minutes of Exercise per Session: 20 min  Stress: Stress Concern Present (07/30/2024)   [employer] of Occupational Health - Occupational Stress Questionnaire    Feeling of Stress: Very much  Social Connections: Unknown (07/30/2024)   Social Connection and Isolation Panel    Frequency of Communication with Friends and Family: Once a week    Frequency of Social Gatherings with Friends and Family: Patient declined    Attends Religious Services: More than 4 times per year    Active Member of [employer] or Organizations: Yes    Attends Banker Meetings: More than 4 times per year    Marital Status:  Married  Intimate Partner Violence: Not At Risk (08/27/2022)   Humiliation, Afraid, Rape, and Kick questionnaire    Fear of Current or Ex-Partner: No    Emotionally Abused: No    Physically Abused: No    Sexually Abused: No  Depression (PHQ2-9): Low Risk (07/30/2024)   Depression (PHQ2-9)    PHQ-2 Score: 0  Alcohol Screen: Not on file  Housing: Low Risk (07/30/2024)   Epic    Unable to Pay for Housing in the Last Year: No    Number of Times Moved in the Last Year: 0    Homeless in the Last Year: No  Utilities: Not At Risk (08/27/2022)   [facility] Utilities    Threatened with loss of utilities: No  Health Literacy: Not on file     Constitutional: Denies fever, malaise, fatigue, headache or abrupt weight changes.  HEENT: Denies eye pain, eye redness, ear pain, ringing in the ears, wax buildup, runny nose, nasal  congestion, bloody nose, or sore throat. Respiratory: Denies difficulty breathing, shortness of breath, cough or sputum production.   Cardiovascular: Denies chest pain, chest tightness, palpitations or swelling in the hands or feet.  Gastrointestinal: Denies abdominal pain, bloating, constipation, diarrhea or blood in the stool.  GU: Denies urgency, frequency, pain with urination, burning sensation, blood in urine, odor or discharge. Musculoskeletal: Denies decrease in range of motion, difficulty with gait, muscle pain or joint pain and swelling.  Skin: Denies redness, rashes, lesions or ulcercations.  Neurological: Denies dizziness, difficulty with memory, difficulty with speech or problems with balance and coordination.  Psych: Denies anxiety, depression, SI/HI.  No other specific complaints in a complete review of systems (except as listed in HPI above).     Objective:   Physical Exam  LMP 07/28/2024 (Approximate)    Wt Readings from Last 3 Encounters:  07/30/24 207 lb 6.4 oz (94.1 kg)  02/19/24 200 lb 3.2 oz (90.8 kg)  02/01/24 200 lb 3.2 oz (90.8 kg)    General: Appears her stated age, obese in NAD. Skin: Warm, dry and intact.  HEENT: Head: normal shape and size; Eyes: sclera white, no icterus, conjunctiva pink, PERRLA and EOMs intact;  Neck:  Neck supple, trachea midline. No masses, lumps present.  Thyromegaly noted without discrete nodule. Cardiovascular: Normal rate and rhythm. S1,S2 noted.  No murmur, rubs or gallops noted. No JVD or BLE edema. Pulmonary/Chest: Normal effort and positive vesicular breath sounds. No respiratory distress. No wheezes, rales or ronchi noted.  Abdomen: Normal bowel sounds.  Musculoskeletal: Strength 5/5 BUE/BLE.  No difficulty with gait.  Neurological: Alert and oriented. Cranial nerves II-XII grossly intact. Coordination normal.  Psychiatric: Mood and affect normal. Behavior is normal. Judgment and thought content normal.    BMET     Component Value Date/Time   NA 139 07/30/2024 1526   NA 136 02/15/2022 1547   K 4.8 07/30/2024 1526   CL 104 07/30/2024 1526   CO2 26 07/30/2024 1526   GLUCOSE 72 07/30/2024 1526   BUN 12 07/30/2024 1526   BUN 9 02/15/2022 1547   CREATININE 0.95 07/30/2024 1526   CALCIUM 9.7 07/30/2024 1526   GFRNONAA >60 08/02/2021 0847   GFRAA >60 12/05/2017 1333    Lipid Panel     Component Value Date/Time   CHOL 157 07/30/2024 1526   TRIG 161 (H) 07/30/2024 1526   HDL 58 07/30/2024 1526   CHOLHDL 2.7 07/30/2024 1526   LDLCALC 75 07/30/2024 1526    CBC    Component  Value Date/Time   WBC 6.7 07/30/2024 1526   RBC 5.18 (H) 07/30/2024 1526   HGB 14.5 07/30/2024 1526   HGB 11.4 06/09/2022 0911   HCT 44.8 07/30/2024 1526   HCT 34.5 06/09/2022 0911   PLT 422 (H) 07/30/2024 1526   PLT 280 06/09/2022 0911   MCV 86.5 07/30/2024 1526   MCV 81 06/09/2022 0911   MCH 28.0 07/30/2024 1526   MCHC 32.4 07/30/2024 1526   RDW 13.0 07/30/2024 1526   RDW 13.1 06/09/2022 0911   LYMPHSABS 1.6 02/15/2022 1547   MONOABS 0.5 08/02/2021 0847   EOSABS 0.1 02/15/2022 1547   BASOSABS 0.1 02/15/2022 1547    Hgb A1C Lab Results  Component Value Date   HGBA1C 5.3 07/30/2024           Assessment & Plan:     RTC in 6 months, follow-up chronic conditions Angeline Laura, NP  "

## 2024-08-13 ENCOUNTER — Ambulatory Visit: Admitting: Internal Medicine

## 2024-08-27 ENCOUNTER — Ambulatory Visit: Admitting: Internal Medicine

## 2024-08-27 ENCOUNTER — Encounter: Payer: Self-pay | Admitting: Internal Medicine

## 2024-08-27 VITALS — BP 132/84 | Ht 69.0 in | Wt 205.4 lb

## 2024-08-27 DIAGNOSIS — E6609 Other obesity due to excess calories: Secondary | ICD-10-CM

## 2024-08-27 DIAGNOSIS — I10 Essential (primary) hypertension: Secondary | ICD-10-CM | POA: Diagnosis not present

## 2024-08-27 DIAGNOSIS — Z6830 Body mass index (BMI) 30.0-30.9, adult: Secondary | ICD-10-CM | POA: Diagnosis not present

## 2024-08-27 DIAGNOSIS — E66811 Obesity, class 1: Secondary | ICD-10-CM | POA: Diagnosis not present

## 2024-08-27 NOTE — Patient Instructions (Signed)
 Hypertension, Adult Hypertension is another name for high blood pressure. High blood pressure forces your heart to work harder to pump blood. This can cause problems over time. There are two numbers in a blood pressure reading. There is a top number (systolic) over a bottom number (diastolic). It is best to have a blood pressure that is below 120/80. What are the causes? The cause of this condition is not known. Some other conditions can lead to high blood pressure. What increases the risk? Some lifestyle factors can make you more likely to develop high blood pressure: Smoking. Not getting enough exercise or physical activity. Being overweight. Having too much fat, sugar, calories, or salt (sodium) in your diet. Drinking too much alcohol. Other risk factors include: Having any of these conditions: Heart disease. Diabetes. High cholesterol. Kidney disease. Obstructive sleep apnea. Having a family history of high blood pressure and high cholesterol. Age. The risk increases with age. Stress. What are the signs or symptoms? High blood pressure may not cause symptoms. Very high blood pressure (hypertensive crisis) may cause: Headache. Fast or uneven heartbeats (palpitations). Shortness of breath. Nosebleed. Vomiting or feeling like you may vomit (nauseous). Changes in how you see. Very bad chest pain. Feeling dizzy. Seizures. How is this treated? This condition is treated by making healthy lifestyle changes, such as: Eating healthy foods. Exercising more. Drinking less alcohol. Your doctor may prescribe medicine if lifestyle changes do not help enough and if: Your top number is above 130. Your bottom number is above 80. Your personal target blood pressure may vary. Follow these instructions at home: Eating and drinking  If told, follow the DASH eating plan. To follow this plan: Fill one half of your plate at each meal with fruits and vegetables. Fill one fourth of your plate  at each meal with whole grains. Whole grains include whole-wheat pasta, brown rice, and whole-grain bread. Eat or drink low-fat dairy products, such as skim milk or low-fat yogurt. Fill one fourth of your plate at each meal with low-fat (lean) proteins. Low-fat proteins include fish, chicken without skin, eggs, beans, and tofu. Avoid fatty meat, cured and processed meat, or chicken with skin. Avoid pre-made or processed food. Limit the amount of salt in your diet to less than 1,500 mg each day. Do not drink alcohol if: Your doctor tells you not to drink. You are pregnant, may be pregnant, or are planning to become pregnant. If you drink alcohol: Limit how much you have to: 0-1 drink a day for women. 0-2 drinks a day for men. Know how much alcohol is in your drink. In the [place] one drink equals one 12 oz bottle of beer (355 mL), one 5 oz glass of wine (148 mL), or one 1 oz glass of hard liquor (44 mL). Lifestyle  Work with your doctor to stay at a healthy weight or to lose weight. Ask your doctor what the best weight is for you. Get at least 30 minutes of exercise that causes your heart to beat faster (aerobic exercise) most days of the week. This may include walking, swimming, or biking. Get at least 30 minutes of exercise that strengthens your muscles (resistance exercise) at least 3 days a week. This may include lifting weights or doing Pilates. Do not smoke or use any products that contain nicotine or tobacco. If you need help quitting, ask your doctor. Check your blood pressure at home as told by your doctor. Keep all follow-up visits. Medicines Take over-the-counter and prescription medicines  only as told by your doctor. Follow directions carefully. Do not skip doses of blood pressure medicine. The medicine does not work as well if you skip doses. Skipping doses also puts you at risk for problems. Ask your doctor about side effects or reactions to medicines that you should watch  for. Contact a doctor if: You think you are having a reaction to the medicine you are taking. You have headaches that keep coming back. You feel dizzy. You have swelling in your ankles. You have trouble with your vision. Get help right away if: You get a very bad headache. You start to feel mixed up (confused). You feel weak or numb. You feel faint. You have very bad pain in your: Chest. Belly (abdomen). You vomit more than once. You have trouble breathing. These symptoms may be an emergency. Get help right away. Call 911. Do not wait to see if the symptoms will go away. Do not drive yourself to the hospital. Summary Hypertension is another name for high blood pressure. High blood pressure forces your heart to work harder to pump blood. For most people, a normal blood pressure is less than 120/80. Making healthy choices can help lower blood pressure. If your blood pressure does not get lower with healthy choices, you may need to take medicine. This information is not intended to replace advice given to you by your health care provider. Make sure you discuss any questions you have with your health care provider. Document Revised: 05/27/2021 Document Reviewed: 05/27/2021 Elsevier Patient Education  2024 [employer].

## 2024-08-27 NOTE — Assessment & Plan Note (Signed)
 Encouraged diet and exercise for weight loss ?

## 2024-08-27 NOTE — Progress Notes (Signed)
 "  Subjective:    Patient ID: Gail Perez, female    DOB: [birth year], 34 y.o.   MRN: [id number]  HPI  Discussed the use of AI scribe software for clinical note transcription with the patient, who gave verbal consent to proceed.  Gail Perez is a 34 year old female with hypertension who presents for a follow-up on her blood pressure management.  She is currently taking a combination pill of amlodipine  10 mg and olmesartan  20 mg. She feels 'pretty okay' since starting this medication, although she occasionally experiences mild dizziness. No episodes of syncope have occurred.  The patient denies headaches, blurred vision, chest pain, shortness of breath, and swelling in the legs.  Her BP today is 132/84.     Review of Systems  Past Medical History:  Diagnosis Date   Allergy    Chlamydia    GERD (gastroesophageal reflux disease)    Hx of varicella    Pregnancy induced hypertension 08/22/2012   Seasonal allergies    Urinary tract infection    Vaginal Pap smear, abnormal     Current Outpatient Medications  Medication Sig Dispense Refill   amlodipine -olmesartan  (AZOR ) 10-20 MG tablet Take 1 tablet by mouth daily. 90 tablet 1   No current facility-administered medications for this visit.    No Known Allergies  Family History  Problem Relation Age of Onset   Anemia Mother    Stroke Father    Hypertension Father    Hyperlipidemia Father    Heart disease Father        enlatged heart   Sarcoidosis Father    Hypertension Sister    Lung cancer Maternal Grandfather    Breast cancer Paternal Grandmother    Other Neg Hx     Social History   Socioeconomic History   Marital status: Married    Spouse name: Orion   Number of children: Not on file   Years of education: Not on file   Highest education level: Master's degree (e.g., MA, MS, MEng, MEd, MSW, MBA)  Occupational History   Occupation: Product Manager    Comment: [employer]  Tobacco Use   Smoking status: Never     Passive exposure: Never   Smokeless tobacco: Never  Vaping Use   Vaping status: Never Used  Substance and Sexual Activity   Alcohol use: Not Currently    Comment: occasional   Drug use: Never   Sexual activity: Yes    Birth control/protection: I.U.D.  Other Topics Concern   Not on file  Social History Narrative   Not on file   Social Drivers of Health   Tobacco Use: Low Risk (07/30/2024)   Patient History    Smoking Tobacco Use: Never    Smokeless Tobacco Use: Never    Passive Exposure: Never  Financial Resource Strain: Low Risk (07/30/2024)   Overall Financial Resource Strain (CARDIA)    Difficulty of Paying Living Expenses: Not hard at all  Food Insecurity: No Food Insecurity (07/30/2024)   Epic    Worried About Programme Researcher, Broadcasting/film/video in the Last Year: Never true    Ran Out of Food in the Last Year: Never true  Transportation Needs: No Transportation Needs (07/30/2024)   Epic    Lack of Transportation (Medical): No    Lack of Transportation (Non-Medical): No  Physical Activity: Insufficiently Active (07/30/2024)   Exercise Vital Sign    Days of Exercise per Week: 3 days    Minutes of Exercise per Session: 20 min  Stress: Stress Concern Present (07/30/2024)   [employer] of Occupational Health - Occupational Stress Questionnaire    Feeling of Stress: Very much  Social Connections: Unknown (07/30/2024)   Social Connection and Isolation Panel    Frequency of Communication with Friends and Family: Once a week    Frequency of Social Gatherings with Friends and Family: Patient declined    Attends Religious Services: More than 4 times per year    Active Member of [employer] or Organizations: Yes    Attends Engineer, Structural: More than 4 times per year    Marital Status: Married  Catering Manager Violence: Not At Risk (08/27/2022)   Humiliation, Afraid, Rape, and Kick questionnaire    Fear of Current or Ex-Partner: No    Emotionally Abused: No    Physically Abused: No     Sexually Abused: No  Depression (PHQ2-9): Low Risk (07/30/2024)   Depression (PHQ2-9)    PHQ-2 Score: 0  Alcohol Screen: Not on file  Housing: Low Risk (07/30/2024)   Epic    Unable to Pay for Housing in the Last Year: No    Number of Times Moved in the Last Year: 0    Homeless in the Last Year: No  Utilities: Not At Risk (08/27/2022)   [facility] Utilities    Threatened with loss of utilities: No  Health Literacy: Not on file     Constitutional: Denies fever, malaise, fatigue, headache or abrupt weight changes.  HEENT: Denies eye pain, eye redness, ear pain, ringing in the ears, wax buildup, runny nose, nasal congestion, bloody nose, or sore throat. Respiratory: Denies difficulty breathing, shortness of breath, cough or sputum production.   Cardiovascular: Denies chest pain, chest tightness, palpitations or swelling in the hands or feet.  Gastrointestinal: Denies abdominal pain, bloating, constipation, diarrhea or blood in the stool.  GU: Denies urgency, frequency, pain with urination, burning sensation, blood in urine, odor or discharge. Musculoskeletal: Denies decrease in range of motion, difficulty with gait, muscle pain or joint pain and swelling.  Skin: Denies redness, rashes, lesions or ulcercations.  Neurological: Patient reports intermittent dizziness.  Denies difficulty with memory, difficulty with speech or problems with balance and coordination.  Psych: Denies anxiety, depression, SI/HI.  No other specific complaints in a complete review of systems (except as listed in HPI above).     Objective:   Physical Exam  BP 132/84 (BP Location: Left Arm, Patient Position: Sitting, Cuff Size: Large)   Ht 5' 9 (1.753 m)   Wt 205 lb 6.4 oz (93.2 kg)   LMP 08/22/2024 (Approximate)   BMI 30.33 kg/m     Wt Readings from Last 3 Encounters:  07/30/24 207 lb 6.4 oz (94.1 kg)  02/19/24 200 lb 3.2 oz (90.8 kg)  02/01/24 200 lb 3.2 oz (90.8 kg)    General: Appears her stated age,  obese, in NAD. HEENT: Head: normal shape and size; Eyes: sclera white, no icterus, conjunctiva pink, PERRLA and EOMs intact;  Cardiovascular: Normal rate and rhythm.  Pulmonary/Chest: Normal effort. No respiratory distress.   Musculoskeletal:  No difficulty with gait.  Neurological: Alert and oriented.   BMET    Component Value Date/Time   NA 139 07/30/2024 1526   NA 136 02/15/2022 1547   K 4.8 07/30/2024 1526   CL 104 07/30/2024 1526   CO2 26 07/30/2024 1526   GLUCOSE 72 07/30/2024 1526   BUN 12 07/30/2024 1526   BUN 9 02/15/2022 1547   CREATININE 0.95 07/30/2024 1526  CALCIUM 9.7 07/30/2024 1526   GFRNONAA >60 08/02/2021 0847   GFRAA >60 12/05/2017 1333    Lipid Panel     Component Value Date/Time   CHOL 157 07/30/2024 1526   TRIG 161 (H) 07/30/2024 1526   HDL 58 07/30/2024 1526   CHOLHDL 2.7 07/30/2024 1526   LDLCALC 75 07/30/2024 1526    CBC    Component Value Date/Time   WBC 6.7 07/30/2024 1526   RBC 5.18 (H) 07/30/2024 1526   HGB 14.5 07/30/2024 1526   HGB 11.4 06/09/2022 0911   HCT 44.8 07/30/2024 1526   HCT 34.5 06/09/2022 0911   PLT 422 (H) 07/30/2024 1526   PLT 280 06/09/2022 0911   MCV 86.5 07/30/2024 1526   MCV 81 06/09/2022 0911   MCH 28.0 07/30/2024 1526   MCHC 32.4 07/30/2024 1526   RDW 13.0 07/30/2024 1526   RDW 13.1 06/09/2022 0911   LYMPHSABS 1.6 02/15/2022 1547   MONOABS 0.5 08/02/2021 0847   EOSABS 0.1 02/15/2022 1547   BASOSABS 0.1 02/15/2022 1547    Hgb A1C Lab Results  Component Value Date   HGBA1C 5.3 07/30/2024           Assessment & Plan:   Assessment and Plan    Hypertension Well-controlled with amlodipine  olmesartan  10/20 mg. Blood pressure slightly above target. Occasional dizziness reported. - Continue amlodipine  olmesartan  10/20 mg daily. - Advised reduction of salt intake. - Encouraged weight loss. - Scheduled follow-up in June to monitor blood pressure.        RTC in 5 months, follow-up chronic  conditions Angeline Laura, NP  "

## 2024-09-18 ENCOUNTER — Emergency Department
Admission: EM | Admit: 2024-09-18 | Discharge: 2024-09-18 | Disposition: A | Discharge status: Home / Self Care | Attending: Emergency Medicine | Admitting: Emergency Medicine

## 2024-09-18 ENCOUNTER — Encounter: Payer: Self-pay | Admitting: *Deleted

## 2024-09-18 ENCOUNTER — Other Ambulatory Visit: Payer: Self-pay

## 2024-09-18 ENCOUNTER — Emergency Department

## 2024-09-18 DIAGNOSIS — R0789 Other chest pain: Secondary | ICD-10-CM | POA: Diagnosis present

## 2024-09-18 DIAGNOSIS — I10 Essential (primary) hypertension: Secondary | ICD-10-CM | POA: Insufficient documentation

## 2024-09-18 LAB — BASIC METABOLIC PANEL WITH GFR
Anion gap: 9 (ref 5–15)
BUN: 9 mg/dL (ref 6–20)
CO2: 26 mmol/L (ref 22–32)
Calcium: 9.4 mg/dL (ref 8.9–10.3)
Chloride: 105 mmol/L (ref 98–111)
Creatinine, Ser: 0.9 mg/dL (ref 0.44–1.00)
GFR, Estimated: >60 mL/min
Glucose, Bld: 76 mg/dL (ref 70–99)
Potassium: 4 mmol/L (ref 3.5–5.1)
Sodium: 140 mmol/L (ref 135–145)

## 2024-09-18 LAB — CBC
HCT: 40.7 % (ref 36.0–46.0)
Hemoglobin: 13.4 g/dL (ref 12.0–15.0)
MCH: 28.2 pg (ref 26.0–34.0)
MCHC: 32.9 g/dL (ref 30.0–36.0)
MCV: 85.7 fL (ref 80.0–100.0)
Platelets: 373 10*3/uL (ref 150–400)
RBC: 4.75 MIL/uL (ref 3.87–5.11)
RDW: 12.7 % (ref 11.5–15.5)
WBC: 7.6 10*3/uL (ref 4.0–10.5)
nRBC: 0 % (ref 0.0–0.2)

## 2024-09-18 LAB — TROPONIN T, HIGH SENSITIVITY: Troponin T High Sensitivity: 12 ng/L (ref 0–19)

## 2024-09-18 MED ORDER — PANTOPRAZOLE SODIUM 40 MG PO TBEC: 40.0000 mg | DELAYED_RELEASE_TABLET | Freq: Every day | ORAL | 1 refills | Status: AC

## 2024-09-18 NOTE — ED Provider Notes (Signed)
 "  [facility] Provider Note    Event Date/Time   First MD Initiated Contact with Patient 09/18/24 2014     (approximate)   History   Chest Pain   HPI  Gail Perez is a 34 y.o. female who reports a history of acid reflux who presents with complaints of chest discomfort for about 2 weeks, she reports it is intermittent, clearly worse when lying down at night.  Occasionally takes Tums with little relief does not take anything else for GERD.  No shortness of breath, no pleurisy, no significant radiation, no calf pain or swelling.  Not related to exertion     Physical Exam   Triage Vital Signs: ED Triage Vitals  Encounter Vitals Group     BP 09/18/24 1808 (!) 155/110     Girls Systolic BP Percentile --      Girls Diastolic BP Percentile --      Boys Systolic BP Percentile --      Boys Diastolic BP Percentile --      Pulse Rate 09/18/24 1808 73     Resp 09/18/24 1808 18     Temp 09/18/24 1808 98.2 F (36.8 C)     Temp Source 09/18/24 1808 Oral     SpO2 09/18/24 1808 100 %     Weight 09/18/24 1806 91.6 kg (202 lb)     Height 09/18/24 1806 1.753 m (5' 9)     Head Circumference --      Peak Flow --      Pain Score 09/18/24 1806 8     Pain Loc --      Pain Education --      Exclude from Growth Chart --     Most recent vital signs: Vitals:   09/18/24 1808  BP: (!) 155/110  Pulse: 73  Resp: 18  Temp: 98.2 F (36.8 C)  SpO2: 100%     General: Awake, no distress.  CV:  Good peripheral perfusion.  Regular rate and rhythm, no murmur Resp:  Normal effort.  Clear to auscultation bilaterally Abd:  No distention.  Other:  No calf pain or swelling   ED Results / Procedures / Treatments   Labs (all labs ordered are listed, but only abnormal results are displayed) Labs Reviewed  BASIC METABOLIC PANEL WITH GFR  CBC  TROPONIN T, HIGH SENSITIVITY  TROPONIN T, HIGH SENSITIVITY     EKG  ED ECG REPORT I, Lamar Price, the attending  physician, personally viewed and interpreted this ECG.  Date: 09/18/2024  Rhythm: normal sinus rhythm QRS Axis: normal Intervals: normal ST/T Wave abnormalities: normal Narrative Interpretation: no evidence of acute ischemia    RADIOLOGY Chest x-ray view interpret by me, no acute abnormality    PROCEDURES:  Critical Care performed:   Procedures   MEDICATIONS ORDERED IN ED: Medications - No data to display   IMPRESSION / MDM / ASSESSMENT AND PLAN / ED COURSE  I reviewed the triage vital signs and the nursing notes. Patient's presentation is most consistent with acute presentation with potential threat to life or bodily function.  Patient presents with chest pain as detailed above, she is slightly hypertensive although she reports that typically her blood pressure is well-controlled she thinks this is related to anxiety.  Presentation suspicious for GERD/esophagitis, less likely ACS given her age risk factors, doubt pneumonia, not consistent with PE  Lab work is quite reassuring, high sensitive troponin is normal, chest x-ray is unremarkable  Discussed with her  that we will start her on PPI and that she will need close follow-up with her PCP for recheck and blood pressure recheck, strict return precautions  She agrees with this plan.      FINAL CLINICAL IMPRESSION(S) / ED DIAGNOSES   Final diagnoses:  Atypical chest pain  Uncontrolled hypertension     Rx / DC Orders   ED Discharge Orders          Ordered    pantoprazole  (PROTONIX ) 40 MG tablet  Daily        09/18/24 2043             Note:  This document was prepared using Dragon voice recognition software and may include unintentional dictation errors.   Arlander Charleston, MD 09/18/24 2145  "

## 2024-09-18 NOTE — ED Triage Notes (Signed)
 Pt ambulatory to triage.  Pt has chest pain since this am.  Pt reports pain worse through out day.  No n/v  no sob  pt alert.

## 2025-01-24 ENCOUNTER — Ambulatory Visit: Admitting: Internal Medicine
